# Patient Record
Sex: Male | Born: 1935 | Race: White | Hispanic: No | Smoking: Former smoker
Health system: Southern US, Community
[De-identification: ages and names within clinical notes are randomized; demographics above are authoritative.]

## PROBLEM LIST (undated history)

## (undated) DIAGNOSIS — E119 Type 2 diabetes mellitus without complications: Secondary | ICD-10-CM

## (undated) DIAGNOSIS — W19XXXA Unspecified fall, initial encounter: Secondary | ICD-10-CM

## (undated) DIAGNOSIS — R296 Repeated falls: Secondary | ICD-10-CM

## (undated) DIAGNOSIS — R42 Dizziness and giddiness: Secondary | ICD-10-CM

## (undated) HISTORY — DX: Type 2 diabetes mellitus without complications: E11.9

## (undated) HISTORY — PX: WISDOM TOOTH EXTRACTION: SHX21

## (undated) HISTORY — PX: TONSILLECTOMY: SUR1361

## (undated) HISTORY — DX: Unspecified fall, initial encounter: W19.XXXA

## (undated) HISTORY — PX: BLADDER SURGERY: SHX569

## (undated) HISTORY — DX: Dizziness and giddiness: R42

## (undated) HISTORY — DX: Repeated falls: R29.6

## (undated) HISTORY — PX: ADENOIDECTOMY: SUR15

## (undated) HISTORY — PX: CATARACT EXTRACTION: SUR2

---

## 1976-04-25 HISTORY — PX: VASECTOMY: SHX75

## 2007-04-26 HISTORY — PX: PROSTATE SURGERY: SHX751

## 2010-10-26 DIAGNOSIS — C61 Malignant neoplasm of prostate: Secondary | ICD-10-CM | POA: Insufficient documentation

## 2010-10-26 HISTORY — DX: Malignant neoplasm of prostate: C61

## 2011-02-02 DIAGNOSIS — J309 Allergic rhinitis, unspecified: Secondary | ICD-10-CM

## 2011-02-02 HISTORY — DX: Allergic rhinitis, unspecified: J30.9

## 2012-02-13 DIAGNOSIS — K635 Polyp of colon: Secondary | ICD-10-CM | POA: Insufficient documentation

## 2012-02-13 HISTORY — DX: Polyp of colon: K63.5

## 2012-04-25 HISTORY — PX: CATARACT EXTRACTION: SUR2

## 2012-12-13 DIAGNOSIS — F419 Anxiety disorder, unspecified: Secondary | ICD-10-CM | POA: Insufficient documentation

## 2012-12-13 HISTORY — DX: Anxiety disorder, unspecified: F41.9

## 2013-01-30 DIAGNOSIS — H2510 Age-related nuclear cataract, unspecified eye: Secondary | ICD-10-CM | POA: Insufficient documentation

## 2013-01-30 HISTORY — DX: Age-related nuclear cataract, unspecified eye: H25.10

## 2013-02-14 DIAGNOSIS — E1140 Type 2 diabetes mellitus with diabetic neuropathy, unspecified: Secondary | ICD-10-CM

## 2013-02-14 HISTORY — DX: Type 2 diabetes mellitus with diabetic neuropathy, unspecified: E11.40

## 2013-05-17 DIAGNOSIS — M1990 Unspecified osteoarthritis, unspecified site: Secondary | ICD-10-CM

## 2013-05-17 HISTORY — DX: Unspecified osteoarthritis, unspecified site: M19.90

## 2013-05-27 DIAGNOSIS — E559 Vitamin D deficiency, unspecified: Secondary | ICD-10-CM

## 2013-05-27 HISTORY — DX: Vitamin D deficiency, unspecified: E55.9

## 2014-06-20 DIAGNOSIS — G629 Polyneuropathy, unspecified: Secondary | ICD-10-CM

## 2014-06-20 HISTORY — DX: Polyneuropathy, unspecified: G62.9

## 2015-03-15 DIAGNOSIS — R269 Unspecified abnormalities of gait and mobility: Secondary | ICD-10-CM | POA: Insufficient documentation

## 2015-03-15 DIAGNOSIS — M51369 Other intervertebral disc degeneration, lumbar region without mention of lumbar back pain or lower extremity pain: Secondary | ICD-10-CM

## 2015-03-15 DIAGNOSIS — M5136 Other intervertebral disc degeneration, lumbar region: Secondary | ICD-10-CM

## 2015-03-15 HISTORY — DX: Unspecified abnormalities of gait and mobility: R26.9

## 2015-03-15 HISTORY — DX: Other intervertebral disc degeneration, lumbar region without mention of lumbar back pain or lower extremity pain: M51.369

## 2015-03-15 HISTORY — DX: Other intervertebral disc degeneration, lumbar region: M51.36

## 2016-04-04 DIAGNOSIS — N320 Bladder-neck obstruction: Secondary | ICD-10-CM | POA: Insufficient documentation

## 2016-04-04 HISTORY — DX: Bladder-neck obstruction: N32.0

## 2016-09-13 DIAGNOSIS — N529 Male erectile dysfunction, unspecified: Secondary | ICD-10-CM | POA: Insufficient documentation

## 2016-09-13 DIAGNOSIS — N400 Enlarged prostate without lower urinary tract symptoms: Secondary | ICD-10-CM | POA: Insufficient documentation

## 2016-09-13 HISTORY — DX: Male erectile dysfunction, unspecified: N52.9

## 2016-09-13 HISTORY — DX: Benign prostatic hyperplasia without lower urinary tract symptoms: N40.0

## 2016-12-15 DIAGNOSIS — R6889 Other general symptoms and signs: Secondary | ICD-10-CM

## 2016-12-15 HISTORY — DX: Other general symptoms and signs: R68.89

## 2017-03-31 DIAGNOSIS — E119 Type 2 diabetes mellitus without complications: Secondary | ICD-10-CM | POA: Insufficient documentation

## 2017-03-31 DIAGNOSIS — I10 Essential (primary) hypertension: Secondary | ICD-10-CM

## 2017-03-31 HISTORY — DX: Type 2 diabetes mellitus without complications: E11.9

## 2017-03-31 HISTORY — DX: Essential (primary) hypertension: I10

## 2017-12-29 ENCOUNTER — Encounter: Payer: Self-pay | Admitting: Podiatry

## 2017-12-29 ENCOUNTER — Ambulatory Visit (INDEPENDENT_AMBULATORY_CARE_PROVIDER_SITE_OTHER): Payer: Medicare Other | Admitting: Podiatry

## 2017-12-29 DIAGNOSIS — M79675 Pain in left toe(s): Secondary | ICD-10-CM | POA: Diagnosis not present

## 2017-12-29 DIAGNOSIS — M79674 Pain in right toe(s): Secondary | ICD-10-CM | POA: Diagnosis not present

## 2017-12-29 DIAGNOSIS — M2031 Hallux varus (acquired), right foot: Secondary | ICD-10-CM | POA: Diagnosis not present

## 2017-12-29 DIAGNOSIS — E1142 Type 2 diabetes mellitus with diabetic polyneuropathy: Secondary | ICD-10-CM | POA: Diagnosis not present

## 2017-12-29 DIAGNOSIS — B351 Tinea unguium: Secondary | ICD-10-CM

## 2017-12-29 NOTE — Patient Instructions (Signed)

## 2017-12-29 NOTE — Progress Notes (Signed)
This patient presents to the office with chief complaint of long thick nails and diabetic feet. He has been diabetic for 25 years. This patient  says there  is  no pain and discomfort in his  feet.  This patient says there are long thick painful nails. He says he has moved to [place] from [place] due to his wifes illness.  These nails are painful walking and wearing shoes.  Patient has no history of infection or drainage from both feet.  Patient is unable to  self treat his own nails . This patient presents  to the office today for treatment of the  long nails and a foot evaluation due to history of  diabetes.  General Appearance  Alert, conversant and in no acute stress.  Vascular  Dorsalis pedis and posterior tibial  pulses are palpable  bilaterally.  Capillary return is within normal limits  bilaterally. Temperature is within normal limits  bilaterally.  Neurologic  Senn-Weinstein monofilament wire test diminished   bilaterally. Muscle power within normal limits bilaterally.  Nails Thick disfigured discolored nails with subungual debris  from hallux to fifth toes bilaterally. No evidence of bacterial infection or drainage bilaterally.  Orthopedic  No limitations of motion of motion feet .  No crepitus or effusions noted.  Fixed contracted position of IPJ right hallux.  Skin  normotropic skin with no porokeratosis noted bilaterally.  No signs of infections or ulcers noted.     Onychomycosis  Diabetes with no foot complications  IE  Debride nails x 10.  A diabetic foot exam was performed and there is no evidence of any vascular  pathology.  Diminished LOPS noted.  RTC 3 months.   Gardiner Barefoot DPM

## 2018-03-30 ENCOUNTER — Encounter: Payer: Self-pay | Admitting: Podiatry

## 2018-03-30 ENCOUNTER — Ambulatory Visit (INDEPENDENT_AMBULATORY_CARE_PROVIDER_SITE_OTHER): Payer: Medicare Other | Admitting: Podiatry

## 2018-03-30 DIAGNOSIS — M79675 Pain in left toe(s): Secondary | ICD-10-CM | POA: Diagnosis not present

## 2018-03-30 DIAGNOSIS — B351 Tinea unguium: Secondary | ICD-10-CM

## 2018-03-30 DIAGNOSIS — M2031 Hallux varus (acquired), right foot: Secondary | ICD-10-CM

## 2018-03-30 DIAGNOSIS — E1142 Type 2 diabetes mellitus with diabetic polyneuropathy: Secondary | ICD-10-CM

## 2018-03-30 DIAGNOSIS — M79674 Pain in right toe(s): Secondary | ICD-10-CM

## 2018-03-30 NOTE — Progress Notes (Signed)
Complaint:  Visit Type: Patient returns to my office for continued preventative foot care services. Complaint: Patient states" my nails have grown long and thick and become painful to walk and wear shoes" Patient has been diagnosed with DM with no foot complications. The patient presents for preventative foot care services. No changes to ROS.  Patient is also concerned about his painful big toe right foot.  Podiatric Exam: Vascular: dorsalis pedis and posterior tibial pulses are palpable bilateral. Capillary return is immediate. Temperature gradient is WNL. Skin turgor WNL  Sensorium: Normal Semmes Weinstein monofilament test. Normal tactile sensation bilaterally. Nail Exam: Pt has thick disfigured discolored nails with subungual debris noted bilateral entire nail hallux through fifth toenails Ulcer Exam: There is no evidence of ulcer or pre-ulcerative changes or infection. Orthopedic Exam: Muscle tone and strength are WNL. No limitations in general ROM. No crepitus or effusions noted. Foot type and digits show no abnormalities. Fixed hallux malleus IPJ right foot. Skin: No Porokeratosis. No infection or ulcers  Diagnosis:  Onychomycosis, , Pain in right toe, pain in left toes  Treatment & Plan Procedures and Treatment: Consent by patient was obtained for treatment procedures.   Debridement of mycotic and hypertrophic toenails, 1 through 5 bilateral and clearing of subungual debris. No ulceration, no infection noted. Patient to be referred to Dr.  March Rummage. Return Visit-Office Procedure: Patient instructed to return to the office for a follow up visit 3 months for continued evaluation and treatment.    Gardiner Barefoot DPM

## 2018-04-19 ENCOUNTER — Ambulatory Visit (INDEPENDENT_AMBULATORY_CARE_PROVIDER_SITE_OTHER): Payer: Medicare Other

## 2018-04-19 ENCOUNTER — Ambulatory Visit (INDEPENDENT_AMBULATORY_CARE_PROVIDER_SITE_OTHER): Payer: Medicare Other | Admitting: Podiatry

## 2018-04-19 DIAGNOSIS — M2031 Hallux varus (acquired), right foot: Secondary | ICD-10-CM

## 2018-04-19 DIAGNOSIS — M2041 Other hammer toe(s) (acquired), right foot: Secondary | ICD-10-CM

## 2018-04-19 DIAGNOSIS — M79674 Pain in right toe(s): Secondary | ICD-10-CM

## 2018-04-19 DIAGNOSIS — M19079 Primary osteoarthritis, unspecified ankle and foot: Secondary | ICD-10-CM

## 2018-04-19 NOTE — Progress Notes (Signed)
Subjective:  Patient ID: Logan Hobbs, male    DOB: 1934/07/02,  MRN: [id number]  Chief Complaint  Patient presents with  . Toe Pain    right great toe pain Prudence Davidson referral    83 y.o. male presents with the above complaint.  Reports pain to the top of the right great toe has had this issue for several months.  Seen Dr. Sharyon Cable for this and for for surgical evaluation.  States that the pain is most on the top of the toe hurts with shoe gear.  Review of Systems: Negative except as noted in the HPI. Denies N/V/F/Ch.  Past Medical History:  Diagnosis Date  . Diabetes mellitus without complication (HCC)     Current Outpatient Medications:  .  colchicine 0.6 MG tablet, TAKE 2 TABLETS BY MOUTH NOW AND 1 TABLET IN 1 HOUR, Disp: , Rfl: 0 .  indomethacin (INDOCIN SR) 75 MG CR capsule, Take 75 mg by mouth 2 (two) times daily., Disp: , Rfl: 0 .  L-Methylfolate-Algae-B12-B6 (METANX) 3-90.314-2-35 MG CAPS, TK 1 C PO twice daily, Disp: , Rfl:  .  LACTOBACILLUS PO, Take by mouth., Disp: , Rfl:  .  Lancet Devices (BAYER MICROLET 2 LANCING DEVIC) MISC, Check blood sugar once daily, Disp: , Rfl:  .  losartan (COZAAR) 25 MG tablet, TAKE 1 TABLET DAILY, Disp: , Rfl:  .  ONETOUCH DELICA LANCETS 38S MISC, CHECK BLOOD SUGAR ONCE DAILY, Disp: , Rfl:  .  rosuvastatin (CRESTOR) 5 MG tablet, TAKE 1 TABLET AT BEDTIME, Disp: , Rfl:  .  SILDENAFIL CITRATE PO, Take by mouth., Disp: , Rfl:  .  sitaGLIPtin-metformin (JANUMET) 50-1000 MG tablet, TAKE 1 TABLET TWICE DAILY  WITH MEALS, Disp: , Rfl:   Social History   Tobacco Use  Smoking Status Never Smoker  Smokeless Tobacco Never Used    Allergies  Allergen Reactions  . Latex Shortness Of Breath    Skin itching and breathing problems  . Ciprofloxacin     constipation  . Seasonal Ic [Cholestatin]   . Solifenacin Succinate     constipation  . Tamsulosin Hcl     Weakness, vertigo, and nausea   Objective:  There were no vitals filed for this  visit. There is no height or weight on file to calculate BMI. Constitutional Well developed. Well nourished.  Vascular Dorsalis pedis pulses palpable bilaterally. Posterior tibial pulses palpable bilaterally. Capillary refill normal to all digits.  No cyanosis or clubbing noted. Pedal hair growth normal.  Neurologic Normal speech. Oriented to person, place, and time. Epicritic sensation to light touch grossly present bilaterally.  Dermatologic Nails well groomed and normal in appearance. No open wounds. No skin lesions.  Orthopedic: Normal joint ROM without pain or crepitus bilaterally. Hallux IPJ malleus deformity with pain to palpation with dorsal aspect   Radiographs: Hallux hammertoe noted with IPJ arthritis Assessment:   1. Hallux malleus of right foot   2. Pain in toe of right foot   3. Arthritis of big toe   4. Acquired hammertoe of right foot    Plan:  Patient was evaluated and treated and all questions answered.  Hallux malleus right great toe -X-rays taken and reviewed -Discussed conservative versus surgical treatment for this issue.  To surgical options discussed the patient consisting of either hallux IPJ arthroplasty or arthrodesis.  As patient is of advanced age I think he is would benefit more from arthroplasty.  Discussed risks and benefits including flail toe, cock-up toe, overcorrection, under correction,  need for future surgery.  Patient would like to proceed.  Discussed adjacent tissue transfer to alleviate soft tissue component.  Surgical discussion discussed with patient at length -Patient has failed all conservative therapy and wishes to proceed with surgical intervention. All risks, benefits, and alternatives discussed with patient. No guarantees given. Consent reviewed and signed by patient. -Planned procedures: Right hallux IPJ arthroplasty, adjacent tissue transfer  25 minutes of face to face time were spent with the patient. >50% of this was spent on  counseling and coordination of care. Specifically discussed with patient the above diagnoses and overall treatment plan.   Return for post op.

## 2018-04-19 NOTE — Patient Instructions (Signed)
Pre-Operative Instructions  Congratulations, you have decided to take an important step towards improving your quality of life.  You can be assured that the doctors and staff at [facility] will be with you every step of the way.  Here are some important things you should know:  1. Plan to be at the surgery center/hospital at least 1 (one) hour prior to your scheduled time, unless otherwise directed by the surgical center/hospital staff.  You must have a responsible adult accompany you, remain during the surgery and drive you home.  Make sure you have directions to the surgical center/hospital to ensure you arrive on time. 2. If you are having surgery at [facility] or [facility], you will need a copy of your medical history and physical form from your family physician within one month prior to the date of surgery. We will give you a form for your primary physician to complete.  3. We make every effort to accommodate the date you request for surgery.  However, there are times where surgery dates or times have to be moved.  We will contact you as soon as possible if a change in schedule is required.   4. No aspirin/ibuprofen for one week before surgery.  If you are on aspirin, any non-steroidal anti-inflammatory medications (Mobic, Aleve, Ibuprofen) should not be taken seven (7) days prior to your surgery.  You make take Tylenol for pain prior to surgery.  5. Medications - If you are taking daily heart and blood pressure medications, seizure, reflux, allergy, asthma, anxiety, pain or diabetes medications, make sure you notify the surgery center/hospital before the day of surgery so they can tell you which medications you should take or avoid the day of surgery. 6. No food or drink after midnight the night before surgery unless directed otherwise by surgical center/hospital staff. 7. No alcoholic beverages 24-hours prior to surgery.  No smoking 24-hours prior or 24-hours after  surgery. 8. Wear loose pants or shorts. They should be loose enough to fit over bandages, boots, and casts. 9. Don't wear slip-on shoes. Sneakers are preferred. 10. Bring your boot with you to the surgery center/hospital.  Also bring crutches or a walker if your physician has prescribed it for you.  If you do not have this equipment, it will be provided for you after surgery. 11. If you have not been contacted by the surgery center/hospital by the day before your surgery, call to confirm the date and time of your surgery. 12. Leave-time from work may vary depending on the type of surgery you have.  Appropriate arrangements should be made prior to surgery with your employer. 13. Prescriptions will be provided immediately following surgery by your doctor.  Fill these as soon as possible after surgery and take the medication as directed. Pain medications will not be refilled on weekends and must be approved by the doctor. 14. Remove nail polish on the operative foot and avoid getting pedicures prior to surgery. 15. Wash the night before surgery.  The night before surgery wash the foot and leg well with water and the antibacterial soap provided. Be sure to pay special attention to beneath the toenails and in between the toes.  Wash for at least three (3) minutes. Rinse thoroughly with water and dry well with a towel.  Perform this wash unless told not to do so by your physician.  Enclosed: 1 Ice pack (please put in freezer the night before surgery)   1 Hibiclens skin cleaner     Pre-op instructions  If you have any questions regarding the instructions, please do not hesitate to call our office.  [place]: [address], [place], [place] [address] -- [phone]  [place]: [address] [place], [place] [address] -- [phone]  [place]: [address]  [place], [place] [address] -- [phone]  [place]: [address], [address], [place], [place] [address] -- [phone]  Website:  [email or web address] 

## 2018-05-08 ENCOUNTER — Encounter: Payer: Self-pay | Admitting: *Deleted

## 2018-05-08 NOTE — Progress Notes (Signed)
Per Dr. March Rummage, I sent a medical clearance request letter to Dr. Geraldo Pitter and Marda Stalker, PA.

## 2018-05-16 ENCOUNTER — Ambulatory Visit (INDEPENDENT_AMBULATORY_CARE_PROVIDER_SITE_OTHER): Payer: Medicare Other | Admitting: Cardiology

## 2018-05-16 ENCOUNTER — Ambulatory Visit: Payer: Self-pay | Admitting: Cardiology

## 2018-05-16 ENCOUNTER — Encounter: Payer: Self-pay | Admitting: Cardiology

## 2018-05-16 VITALS — BP 134/76 | HR 66 | Ht 71.0 in | Wt 199.0 lb

## 2018-05-16 DIAGNOSIS — H9190 Unspecified hearing loss, unspecified ear: Secondary | ICD-10-CM

## 2018-05-16 DIAGNOSIS — Z01810 Encounter for preprocedural cardiovascular examination: Secondary | ICD-10-CM

## 2018-05-16 DIAGNOSIS — E119 Type 2 diabetes mellitus without complications: Secondary | ICD-10-CM | POA: Diagnosis not present

## 2018-05-16 DIAGNOSIS — I10 Essential (primary) hypertension: Secondary | ICD-10-CM | POA: Insufficient documentation

## 2018-05-16 DIAGNOSIS — R42 Dizziness and giddiness: Secondary | ICD-10-CM

## 2018-05-16 DIAGNOSIS — B356 Tinea cruris: Secondary | ICD-10-CM

## 2018-05-16 DIAGNOSIS — C801 Malignant (primary) neoplasm, unspecified: Secondary | ICD-10-CM | POA: Insufficient documentation

## 2018-05-16 DIAGNOSIS — E785 Hyperlipidemia, unspecified: Secondary | ICD-10-CM | POA: Insufficient documentation

## 2018-05-16 DIAGNOSIS — E782 Mixed hyperlipidemia: Secondary | ICD-10-CM

## 2018-05-16 DIAGNOSIS — B351 Tinea unguium: Secondary | ICD-10-CM

## 2018-05-16 HISTORY — DX: Encounter for preprocedural cardiovascular examination: Z01.810

## 2018-05-16 HISTORY — DX: Unspecified hearing loss, unspecified ear: H91.90

## 2018-05-16 HISTORY — DX: Tinea cruris: B35.6

## 2018-05-16 HISTORY — DX: Dizziness and giddiness: R42

## 2018-05-16 HISTORY — DX: Hyperlipidemia, unspecified: E78.5

## 2018-05-16 HISTORY — DX: Malignant (primary) neoplasm, unspecified: C80.1

## 2018-05-16 HISTORY — DX: Tinea unguium: B35.1

## 2018-05-16 HISTORY — DX: Essential (primary) hypertension: I10

## 2018-05-16 NOTE — Progress Notes (Addendum)
Cardiology Office Note:    Date:  05/16/2018   ID:  Logan Hobbs, DOB [birth year], MRN [id number]  PCP:  Logan Stalker, PA-C  Cardiologist:  Logan Lindau, MD   Referring MD: Logan Stalker, PA-C    ASSESSMENT:    1. Pre-operative cardiovascular examination   2. Diabetes mellitus with coincident hypertension ([place])   3. Mixed hyperlipidemia   4. Essential hypertension    PLAN:    In order of problems listed above:  1. Primary prevention stressed with the patient.  Importance of compliance with diet and medication stressed and he vocalized understanding.  His blood pressure is stable.  Diet was discussed for dyslipidemia and diabetes mellitus and he is doing well with it. 2. In view of multiple risk factors for coronary artery disease he will undergo stress echo.  If this is negative then he is not at high risk for coronary events during the aforementioned surgery.  Meticulous hemodynamic monitoring will further reduce the risk of coronary events. 3. Patient will be seen in follow-up appointment on a as needed basis only.   Addendum on 05/21/2018 Patient had a stress test.  His effort tolerance was fair.  His stress test was unremarkable and in view of this he is not at high risk for coronary events during the aforementioned surgery.  Meticulous hemodynamic monitoring as mentioned above will further reduce the risk of coronary events.  Medication Adjustments/Labs and Tests Ordered: Current medicines are reviewed at length with the patient today.  Concerns regarding medicines are outlined above.  No orders of the defined types were placed in this encounter.  No orders of the defined types were placed in this encounter.    History of Present Illness:    Logan Hobbs is a 84 y.o. male who is being seen today for the evaluation of preop cardiovascular evaluation at the request of Logan Hobbs, [place].  Patient is a pleasant 84 year old male.  He has past medical  history of essential hypertension, dyslipidemia and diabetes mellitus.  Patient mentions to me that he is planning to undergo surgery on his right toe for which she is sent here for evaluation.  He is a very active gentleman.  He has slowed on his walking because of his lower extremity issues.  He is accompanied by his wife for this visit.  He denies any chest pain orthopnea or PND.  At the time of my evaluation, the patient is alert awake oriented and in no distress.  Past Medical History:  Diagnosis Date  . Diabetes mellitus without complication [facility])     Past Surgical History:  Procedure Laterality Date  . CATARACT EXTRACTION    . PROSTATE SURGERY  2009  . TONSILLECTOMY    . WISDOM TOOTH EXTRACTION      Current Medications: Current Meds  Medication Sig  . cycloSPORINE (RESTASIS) 0.05 % ophthalmic emulsion Place 1 drop into both eyes 2 (two) times daily.  Marland Kitchen L-Methylfolate-Algae-B12-B6 (METANX) 3-90.314-2-35 MG CAPS TK 1 C PO twice daily  . LACTOBACILLUS PO Take 1 capsule by mouth.   Elmore Guise Devices (BAYER MICROLET 2 LANCING DEVIC) MISC Check blood sugar once daily  . losartan (COZAAR) 25 MG tablet Take 25 mg by mouth daily.   Glory Rosebush DELICA LANCETS 36R MISC CHECK BLOOD SUGAR ONCE DAILY  . rosuvastatin (CRESTOR) 5 MG tablet Take 5 mg by mouth daily at 6 PM.   . SILDENAFIL CITRATE PO Take by mouth.  . sitaGLIPtin-metformin (JANUMET) 50-1000 MG tablet  TAKE 1 TABLET TWICE DAILY  WITH MEALS     Allergies:   Latex; Ciprofloxacin; Seasonal ic [cholestatin]; Solifenacin succinate; and Tamsulosin hcl   Social History   Socioeconomic History  . Marital status: Married    Spouse name: Not on file  . Number of children: Not on file  . Years of education: Not on file  . Highest education level: Not on file  Occupational History  . Not on file  Social Needs  . Financial resource strain: Not on file  . Food insecurity:    Worry: Not on file    Inability: Not on file  .  Transportation needs:    Medical: Not on file    Non-medical: Not on file  Tobacco Use  . Smoking status: Never Smoker  . Smokeless tobacco: Never Used  Substance and Sexual Activity  . Alcohol use: Yes    Comment: beer occs.  . Drug use: Not on file  . Sexual activity: Not on file  Lifestyle  . Physical activity:    Days per week: Not on file    Minutes per session: Not on file  . Stress: Not on file  Relationships  . Social connections:    Talks on phone: Not on file    Gets together: Not on file    Attends religious service: Not on file    Active member of club or organization: Not on file    Attends meetings of clubs or organizations: Not on file    Relationship status: Not on file  Other Topics Concern  . Not on file  Social History Narrative  . Not on file     Family History: The patient's family history is not on file.  ROS:   Please see the history of present illness.    All other systems reviewed and are negative.  EKGs/Labs/Other Studies Reviewed:    The following studies were reviewed today: EKG reveals sinus rhythm and nonspecific ST-T changes.   Recent Labs: No results found for requested labs within last 8760 hours.  Recent Lipid Panel No results found for: CHOL, TRIG, HDL, CHOLHDL, VLDL, LDLCALC, LDLDIRECT  Physical Exam:    VS:  BP 134/76 (BP Location: Right Arm, Patient Position: Sitting, Cuff Size: Normal)   Pulse 66   Ht 5\' 11"  (1.803 m)   Wt 199 lb (90.3 kg)   SpO2 98%   BMI 27.75 kg/m     Wt Readings from Last 3 Encounters:  05/16/18 199 lb (90.3 kg)     GEN: Patient is in no acute distress HEENT: Normal NECK: No JVD; No carotid bruits LYMPHATICS: No lymphadenopathy CARDIAC: S1 S2 regular, 2/6 systolic murmur at the apex. RESPIRATORY:  Clear to auscultation without rales, wheezing or rhonchi  ABDOMEN: Soft, non-tender, non-distended MUSCULOSKELETAL:  No edema; No deformity  SKIN: Warm and dry NEUROLOGIC:  Alert and oriented x  3 PSYCHIATRIC:  Normal affect    Signed, Logan Lindau, MD  05/16/2018 10:32 AM    [place]

## 2018-05-16 NOTE — Patient Instructions (Signed)
Medication Instructions:  Your physician recommends that you continue on your current medications as directed. Please refer to the Current Medication list given to you today.  If you need a refill on your cardiac medications before your next appointment, please call your pharmacy.   Lab work: None.  If you have labs (blood work) drawn today and your tests are completely normal, you will receive your results only by: Marland Kitchen MyChart Message (if you have MyChart) OR . A paper copy in the mail If you have any lab test that is abnormal or we need to change your treatment, we will call you to review the results.  Testing/Procedures: Your physician has requested that you have a stress echocardiogram. For further information please visit [email or web address]. Please follow instruction sheet as given.    Follow-Up: Follow up as needed.   Any Other Special Instructions Will Be Listed Below (If Applicable).   Exercise Stress Echocardiogram  An exercise stress echocardiogram is a test to check how well your heart is working. This test uses sound waves (ultrasound) and a computer to make images of your heart before and after exercise. Ultrasound images that are taken before you exercise (your resting echocardiogram) will show how much blood is getting to your heart muscle and how well your heart muscle and heart valves are functioning. During the next part of this test, you will walk on a treadmill or ride a stationary bike to see how exercise affects your heart. While you exercise, the electrical activity of your heart will be monitored with an electrocardiogram (ECG). Your blood pressure will also be monitored. You may have this test if you:  Have chest pain or other symptoms of a heart problem.  Recently had a heart attack or heart surgery.  Have heart valve problems.  Have a condition that causes narrowing of the blood vessels that supply your heart (coronary artery disease).  Have a high  risk of heart disease and are starting a new exercise program.  Have a high risk of heart disease and need to have major surgery. Tell a health care provider about:  Any allergies you have.  All medicines you are taking, including vitamins, herbs, eye drops, creams, and over-the-counter medicines.  Any problems you or family members have had with anesthetic medicines.  Any blood disorders you have.  Any surgeries you have had.  Any medical conditions you have.  Whether you are pregnant or may be pregnant. What are the risks? Generally, this is a safe procedure. However, problems may occur, including:  Chest pain.  Dizziness or light-headedness.  Shortness of breath.  Increased or irregular heartbeat (palpitations).  Nausea or vomiting.  Heart attack (very rare). What happens before the procedure?  Follow instructions from your health care provider about eating or drinking restrictions. You may be asked to avoid all forms of caffeine for 24 hours before your procedure, or as told by your health care provider.  Ask your health care provider about changing or stopping your regular medicines. This is especially important if you are taking diabetes medicines or blood thinners.  If you use an inhaler, bring it with you to the test.  Wear loose, comfortable clothing and walking shoes.  Do notuse any products that contain nicotine or tobacco, such as cigarettes and e-cigarettes, for 4 hours before the test or as told by your health care provider. If you need help quitting, ask your health care provider. What happens during the procedure?  You will take off your  clothes from the waist up and put on a hospital gown.  A technician will place electrodes on your chest.  A blood pressure cuff will be placed on your arm.  You will lie down on a table for an ultrasound exam before you exercise. Gel will be rubbed on your chest, and a handheld device (transducer) will be pressed  against your chest and moved over your heart.  Then, you will start exercising by walking on a treadmill or pedaling a stationary bicycle.  Your blood pressure and heart rhythm will be monitored while you exercise.  The exercise will gradually get harder or faster.  You will exercise until: ? Your heart reaches a target level. ? You are too tired to continue. ? You cannot continue because of chest pain, weakness, or dizziness.  You will have another ultrasound exam after you stop exercising. The procedure may vary among health care providers and hospitals. What happens after the procedure?  Your heart rate and blood pressure will be monitored until they return to your normal levels. Summary  An exercise stress echocardiogram is a test that uses ultrasound to check how well your heart works before and after exercise.  Before the test, follow instructions from your health care provider about stopping medications, avoiding nicotine and tobacco, and avoiding certain foods and drinks.  During the test, your blood pressure and heart rhythm will be monitored while you exercise on a treadmill or stationary bicycle. This information is not intended to replace advice given to you by your health care provider. Make sure you discuss any questions you have with your health care provider. Document Released: 04/15/2004 Document Revised: 12/02/2015 Document Reviewed: 12/02/2015 Elsevier Interactive Patient Education  2019 [employer].

## 2018-05-18 ENCOUNTER — Telehealth: Payer: Self-pay | Admitting: *Deleted

## 2018-05-18 NOTE — Telephone Encounter (Signed)
"  I'm scheduled to have surgery next week.  I'm calling to follow-up and make sure you have everything that is needed."  We have not received medical clearance to perform the surgery from Marda Stalker, NP and Dr. Geraldo Pitter yet.  We'll need that prior to you having surgery.  "I had blood work and all that by Marda Stalker and I have seen Dr. Geraldo Pitter.  I'm scheduled to have an ECHO on Monday.  Maybe they're waiting on those results before they give an answer."  I'm not sure but you may want to call them and ask.  "I'll give them a call now.  Who do they send the information to, you?"  Yes, I sent them both a letter requesting the clearance.  The letter has my fax number on it.  "Okay, I'll give them a call."

## 2018-05-18 NOTE — Telephone Encounter (Signed)
"  I'm calling about my father, Logan Hobbs.  He's supposed to have surgery on Wednesday with Dr. March Rummage.  He said he talked to you earlier and you had not received anything from his doctors.  I'm assuming you are following up on this.  Please give me a call at your earliest convenience.  I'll see if I can help you get the information you need."  "I'm calling again.  I spoke with his primary physician at [facility] and they said they faxed you that information today before lunch.  Contact me if you did not receive this information.  I'll get them to fax it again.

## 2018-05-21 ENCOUNTER — Ambulatory Visit (HOSPITAL_BASED_OUTPATIENT_CLINIC_OR_DEPARTMENT_OTHER)
Admission: RE | Admit: 2018-05-21 | Discharge: 2018-05-21 | Disposition: A | Discharge status: Home / Self Care | Payer: Medicare Other | Source: Ambulatory Visit | Attending: Cardiology | Admitting: Cardiology

## 2018-05-21 DIAGNOSIS — I10 Essential (primary) hypertension: Secondary | ICD-10-CM | POA: Diagnosis not present

## 2018-05-21 DIAGNOSIS — Z01810 Encounter for preprocedural cardiovascular examination: Secondary | ICD-10-CM

## 2018-05-21 NOTE — Progress Notes (Signed)
  Echocardiogram Echocardiogram Stress Test has been performed.  Jatavis Malek T Dorina Ribaudo 05/21/2018, 3:55 PM

## 2018-05-22 NOTE — Telephone Encounter (Signed)
"  I'm calling from Dr. Julien Nordmann office.  You all sent a letter requesting clearance for Mr. Logan Hobbs.  Dr. Geraldo Pitter has approved him to have surgery.  The information is in Epic."  Thank you so much.  I left messages for Mr. Logan Hobbs and his daughter, Logan Hobbs, that we received medical clearance for him to have the surgery from Dr. Geraldo Pitter.  I asked them to call if they have any further questions.

## 2018-05-23 ENCOUNTER — Other Ambulatory Visit: Payer: Self-pay | Admitting: Podiatry

## 2018-05-23 DIAGNOSIS — M2041 Other hammer toe(s) (acquired), right foot: Secondary | ICD-10-CM | POA: Diagnosis not present

## 2018-05-23 MED ORDER — HYDROCODONE-ACETAMINOPHEN 5-325 MG PO TABS: 1.0000 | ORAL_TABLET | Freq: Four times a day (QID) | ORAL | 0 refills | Status: DC | PRN

## 2018-05-23 MED ORDER — ONDANSETRON HCL 4 MG PO TABS: 4.0000 mg | ORAL_TABLET | Freq: Three times a day (TID) | ORAL | 0 refills | Status: DC | PRN

## 2018-05-23 MED ORDER — CEPHALEXIN 500 MG PO CAPS: 500.0000 mg | ORAL_CAPSULE | Freq: Two times a day (BID) | ORAL | 0 refills | Status: DC

## 2018-05-23 NOTE — Progress Notes (Signed)
Rx sent to pharmacy for outpatient surgery. °

## 2018-05-24 ENCOUNTER — Telehealth: Payer: Self-pay | Admitting: *Deleted

## 2018-05-24 NOTE — Telephone Encounter (Signed)
I called pt's dtr, Kyra Manges and explained all of the instructions given to pt's assistant, Melissa. Kyra Manges thanked me.

## 2018-05-24 NOTE — Telephone Encounter (Signed)
Pt's dtr, Kyra Manges states he is having a lot of pain and swelling and the aide is having to hold the ice pack off of the foot. Kyra Manges states pt does not have a high pain threshold. I asked for the phone number to call pt and she states there are 2 aides there one helping pt is Melissa a private CNA and can reach pt at [phone].

## 2018-05-24 NOTE — Telephone Encounter (Signed)
I spoke with Melissa and asked her to have pt to describe the pain. Pt states it is throbbing waves and has stopped at this time. Melissa state pt had foot propped to eat and then had down to get back to resting. I told Melissa it sounded like the ace wrap was too tight at this point and to remove the boot, sock and the ace wrap, then elevate the foot for 15 minutes, but if the pain increased while elevated needs to dangle for 15 minutes, after 15 minutes either way then put foot level with the hip and beginning at the toes loosely rewrap the ace single layer up the leg, may rest without the boot, but must be in the boot to walk, sleep and should not be up on the foot more than 15 minutes per hour. Melissa instructed pt as I informed her.

## 2018-05-24 NOTE — Telephone Encounter (Signed)
Noted thanks °

## 2018-05-25 ENCOUNTER — Ambulatory Visit (INDEPENDENT_AMBULATORY_CARE_PROVIDER_SITE_OTHER): Payer: Self-pay | Admitting: Podiatry

## 2018-05-25 VITALS — Temp 97.7°F

## 2018-05-25 DIAGNOSIS — Z09 Encounter for follow-up examination after completed treatment for conditions other than malignant neoplasm: Secondary | ICD-10-CM

## 2018-05-25 DIAGNOSIS — M2031 Hallux varus (acquired), right foot: Secondary | ICD-10-CM

## 2018-05-26 NOTE — Progress Notes (Signed)
Subjective:  Patient ID: Logan Hobbs, male    DOB: May 30, 1934,  MRN: [id number]  Chief Complaint  Patient presents with  . Routine Post Op     01.29.2020 Hammertoe Repair Hallux Rt " my foot was really throbbing for the first few days then the pain has improved"     DOS: 05/23/2018 Procedure: Rt hallux hammertoe repair  84 y.o. male returns for post-op check.  Had a lot of pain after the first day and was throbbing. Loosened the ACE bandage and it improved.  Review of Systems: Negative except as noted in the HPI. Denies N/V/F/Ch.  Past Medical History:  Diagnosis Date  . Diabetes mellitus without complication ([place])     Current Outpatient Medications:  .  cephALEXin (KEFLEX) 500 MG capsule, Take 1 capsule (500 mg total) by mouth 2 (two) times daily., Disp: 14 capsule, Rfl: 0 .  cycloSPORINE (RESTASIS) 0.05 % ophthalmic emulsion, Place 1 drop into both eyes 2 (two) times daily., Disp: , Rfl:  .  HYDROcodone-acetaminophen (NORCO) 5-325 MG tablet, Take 1 tablet by mouth every 6 (six) hours as needed for moderate pain., Disp: 30 tablet, Rfl: 0 .  L-Methylfolate-Algae-B12-B6 (METANX) 3-90.314-2-35 MG CAPS, TK 1 C PO twice daily, Disp: , Rfl:  .  LACTOBACILLUS PO, Take 1 capsule by mouth. , Disp: , Rfl:  .  Lancet Devices (BAYER MICROLET 2 LANCING DEVIC) MISC, Check blood sugar once daily, Disp: , Rfl:  .  losartan (COZAAR) 25 MG tablet, Take 25 mg by mouth daily. , Disp: , Rfl:  .  ondansetron (ZOFRAN) 4 MG tablet, Take 1 tablet (4 mg total) by mouth every 8 (eight) hours as needed for nausea or vomiting., Disp: 20 tablet, Rfl: 0 .  ONETOUCH DELICA LANCETS 40J MISC, CHECK BLOOD SUGAR ONCE DAILY, Disp: , Rfl:  .  rosuvastatin (CRESTOR) 5 MG tablet, Take 5 mg by mouth daily at 6 PM. , Disp: , Rfl:  .  SILDENAFIL CITRATE PO, Take by mouth., Disp: , Rfl:  .  sitaGLIPtin-metformin (JANUMET) 50-1000 MG tablet, TAKE 1 TABLET TWICE DAILY  WITH MEALS, Disp: , Rfl:   Social History    Tobacco Use  Smoking Status Never Smoker  Smokeless Tobacco Never Used    Allergies  Allergen Reactions  . Latex Shortness Of Breath    Skin itching and breathing problems  . Ciprofloxacin     constipation  . Seasonal Ic [Cholestatin]   . Solifenacin Succinate     constipation  . Tamsulosin Hcl     Weakness, vertigo, and nausea   Objective:   Vitals:   05/25/18 1207  Temp: 97.7 F (36.5 C)   There is no height or weight on file to calculate BMI. Constitutional Well developed. Well nourished.  Vascular Foot warm and well perfused. Capillary refill normal to all digits.   Neurologic Normal speech. Oriented to person, place, and time. Epicritic sensation to light touch grossly present bilaterally.  Dermatologic Skin healing well without signs of infection. Skin edges well coapted without signs of infection.  Redness consistent with contusion rather than erythema noted about the surgical site  Orthopedic: Tenderness to palpation noted about the surgical site.   Radiographs: None  Assessment:   1. Hallux malleus of right foot   2. Surgery follow-up    Plan:  Patient was evaluated and treated and all questions answered.  S/p foot surgery right -Progressing as expected post-operatively. -XR: None -WB Status: WBAT in surgical shoe -Sutures: Intact. -Medications: none refilled. -  Foot redressed.  Return in about 4 days (around 05/29/2018).

## 2018-05-29 ENCOUNTER — Ambulatory Visit: Payer: Medicare Other

## 2018-05-29 DIAGNOSIS — Z09 Encounter for follow-up examination after completed treatment for conditions other than malignant neoplasm: Secondary | ICD-10-CM

## 2018-05-29 DIAGNOSIS — M2031 Hallux varus (acquired), right foot: Secondary | ICD-10-CM

## 2018-05-29 NOTE — Progress Notes (Signed)
Patient is here for postop dressing change, recent procedure performed on 05/23/2018, hammertoe repair hallux right.  He states that the toe is feeling much better than it was a few days ago, and he seems to be walking better.  Noted reduction in redness around the big toe, no warmth, no drainage.  All sutures remain intact.  No other signs and symptoms of infection.  Betadine and dry sterile dressing were applied along with compression, he is to follow-up this week to see Dr. March Rummage or sooner with any acute symptom changes.

## 2018-05-31 ENCOUNTER — Ambulatory Visit (INDEPENDENT_AMBULATORY_CARE_PROVIDER_SITE_OTHER): Payer: Self-pay | Admitting: Podiatry

## 2018-05-31 DIAGNOSIS — M2031 Hallux varus (acquired), right foot: Secondary | ICD-10-CM

## 2018-05-31 DIAGNOSIS — Z09 Encounter for follow-up examination after completed treatment for conditions other than malignant neoplasm: Secondary | ICD-10-CM

## 2018-05-31 NOTE — Progress Notes (Signed)
Subjective:  Patient ID: Logan Hobbs, male    DOB: October 16, 1934,  MRN: [id number]  Chief Complaint  Patient presents with  . Routine Post Op     DOS 05/23/2018 Hammertoe Repair Hallux Rt; "doing really good; not much pain but would like to switch to tylenol for pain meds when needed"    DOS: 05/23/2018 Procedure: Rt hallux hammertoe repair  84 y.o. male returns for post-op check.  Doing much better with pain.  Review of Systems: Negative except as noted in the HPI. Denies N/V/F/Ch.  Past Medical History:  Diagnosis Date  . Diabetes mellitus without complication ([facility])     Current Outpatient Medications:  .  cycloSPORINE (RESTASIS) 0.05 % ophthalmic emulsion, Place 1 drop into both eyes 2 (two) times daily., Disp: , Rfl:  .  L-Methylfolate-Algae-B12-B6 (METANX) 3-90.314-2-35 MG CAPS, TK 1 C PO twice daily, Disp: , Rfl:  .  LACTOBACILLUS PO, Take 1 capsule by mouth. , Disp: , Rfl:  .  Lancet Devices (BAYER MICROLET 2 LANCING DEVIC) MISC, Check blood sugar once daily, Disp: , Rfl:  .  losartan (COZAAR) 25 MG tablet, Take 25 mg by mouth daily. , Disp: , Rfl:  .  ondansetron (ZOFRAN) 4 MG tablet, Take 1 tablet (4 mg total) by mouth every 8 (eight) hours as needed for nausea or vomiting., Disp: 20 tablet, Rfl: 0 .  ONETOUCH DELICA LANCETS 28Z MISC, CHECK BLOOD SUGAR ONCE DAILY, Disp: , Rfl:  .  rosuvastatin (CRESTOR) 5 MG tablet, Take 5 mg by mouth daily at 6 PM. , Disp: , Rfl:  .  SILDENAFIL CITRATE PO, Take by mouth., Disp: , Rfl:  .  sitaGLIPtin-metformin (JANUMET) 50-1000 MG tablet, TAKE 1 TABLET TWICE DAILY  WITH MEALS, Disp: , Rfl:  .  cephALEXin (KEFLEX) 500 MG capsule, Take 1 capsule (500 mg total) by mouth 2 (two) times daily. (Patient not taking: Reported on 05/31/2018), Disp: 14 capsule, Rfl: 0 .  HYDROcodone-acetaminophen (NORCO) 5-325 MG tablet, Take 1 tablet by mouth every 6 (six) hours as needed for moderate pain. (Patient not taking: Reported on 05/31/2018), Disp: 30 tablet,  Rfl: 0  Social History   Tobacco Use  Smoking Status Never Smoker  Smokeless Tobacco Never Used    Allergies  Allergen Reactions  . Latex Shortness Of Breath    Skin itching and breathing problems  . Ciprofloxacin     constipation  . Seasonal Ic [Cholestatin]   . Solifenacin Succinate     constipation  . Tamsulosin Hcl     Weakness, vertigo, and nausea   Objective:   There were no vitals filed for this visit. There is no height or weight on file to calculate BMI. Constitutional Well developed. Well nourished.  Vascular Foot warm and well perfused. Capillary refill normal to all digits.   Neurologic Normal speech. Oriented to person, place, and time. Epicritic sensation to light touch grossly present bilaterally.  Dermatologic Skin healing well without signs of infection. Skin edges well coapted without signs of infection.  Redness resolved.  Orthopedic: Tenderness to palpation noted about the surgical site.   Radiographs: None  Assessment:   1. Hallux malleus of right foot   2. Surgery follow-up    Plan:  Patient was evaluated and treated and all questions answered.  S/p foot surgery right -Progressing as expected post-operatively. -XR: None -WB Status: WBAT in surgical shoe -Sutures: Intact. Not ready for removal today. -Medications: none refilled. -Foot redressed.  Return in about 1 week (around 06/07/2018)  for price patient.

## 2018-06-08 ENCOUNTER — Ambulatory Visit (INDEPENDENT_AMBULATORY_CARE_PROVIDER_SITE_OTHER): Payer: Medicare Other | Admitting: Podiatry

## 2018-06-08 ENCOUNTER — Encounter: Payer: Self-pay | Admitting: Podiatry

## 2018-06-08 ENCOUNTER — Ambulatory Visit (INDEPENDENT_AMBULATORY_CARE_PROVIDER_SITE_OTHER): Payer: Medicare Other

## 2018-06-08 DIAGNOSIS — M2031 Hallux varus (acquired), right foot: Secondary | ICD-10-CM

## 2018-06-08 DIAGNOSIS — Z09 Encounter for follow-up examination after completed treatment for conditions other than malignant neoplasm: Secondary | ICD-10-CM

## 2018-06-23 NOTE — Progress Notes (Signed)
  Subjective:  Patient ID: Logan Hobbs, male    DOB: 1934/12/21,  MRN: [id number]  Chief Complaint  Patient presents with  . Routine Post Op    DOS 05/23/2018 Hammertoe Repair Hallux Rt   "Its feeling good, there is just a sore spot under the ball of my foot"    DOS: 05/23/2018 Procedure: Rt hallux hammertoe repair  84 y.o. male returns for post-op check. Denies new issues  Review of Systems: Negative except as noted in the HPI. Denies N/V/F/Ch.  Past Medical History:  Diagnosis Date  . Diabetes mellitus without complication ([facility])     Current Outpatient Medications:  .  cycloSPORINE (RESTASIS) 0.05 % ophthalmic emulsion, Place 1 drop into both eyes 2 (two) times daily., Disp: , Rfl:  .  L-Methylfolate-Algae-B12-B6 (METANX) 3-90.314-2-35 MG CAPS, TK 1 C PO twice daily, Disp: , Rfl:  .  LACTOBACILLUS PO, Take 1 capsule by mouth. , Disp: , Rfl:  .  Lancet Devices (BAYER MICROLET 2 LANCING DEVIC) MISC, Check blood sugar once daily, Disp: , Rfl:  .  losartan (COZAAR) 25 MG tablet, Take 25 mg by mouth daily. , Disp: , Rfl:  .  ondansetron (ZOFRAN) 4 MG tablet, Take 1 tablet (4 mg total) by mouth every 8 (eight) hours as needed for nausea or vomiting., Disp: 20 tablet, Rfl: 0 .  ONETOUCH DELICA LANCETS 26R MISC, CHECK BLOOD SUGAR ONCE DAILY, Disp: , Rfl:  .  rosuvastatin (CRESTOR) 5 MG tablet, Take 5 mg by mouth daily at 6 PM. , Disp: , Rfl:  .  SILDENAFIL CITRATE PO, Take by mouth., Disp: , Rfl:  .  sitaGLIPtin-metformin (JANUMET) 50-1000 MG tablet, TAKE 1 TABLET TWICE DAILY  WITH MEALS, Disp: , Rfl:   Social History   Tobacco Use  Smoking Status Never Smoker  Smokeless Tobacco Never Used    Allergies  Allergen Reactions  . Latex Shortness Of Breath    Skin itching and breathing problems  . Ciprofloxacin     constipation  . Seasonal Ic [Cholestatin]   . Solifenacin Succinate     constipation  . Tamsulosin Hcl     Weakness, vertigo, and nausea   Objective:   There  were no vitals filed for this visit. There is no height or weight on file to calculate BMI. Constitutional Well developed. Well nourished.  Vascular Foot warm and well perfused. Capillary refill normal to all digits.   Neurologic Normal speech. Oriented to person, place, and time. Epicritic sensation to light touch grossly present bilaterally.  Dermatologic Skin healing well  Orthopedic: No tenderness to palpation noted about the surgical site.   Radiographs: Taken and reviewed c/w post-op state. Assessment:   1. Hallux malleus of right foot   2. Surgery follow-up    Plan:  Patient was evaluated and treated and all questions answered.  S/p foot surgery right -Progressing as expected post-operatively. -XR: None -WB Status: WBAT in surgical shoe. Transition to normal shoes in 2 weeks. -Sutures: out -Medications: none refilled. -Foot redressed.  Return in about 1 month (around 07/07/2018) for Post-op no XRs.

## 2018-06-25 ENCOUNTER — Telehealth: Payer: Self-pay | Admitting: Podiatry

## 2018-06-26 ENCOUNTER — Ambulatory Visit (INDEPENDENT_AMBULATORY_CARE_PROVIDER_SITE_OTHER): Payer: Medicare Other

## 2018-06-26 ENCOUNTER — Ambulatory Visit (INDEPENDENT_AMBULATORY_CARE_PROVIDER_SITE_OTHER): Payer: Medicare Other | Admitting: Sports Medicine

## 2018-06-26 ENCOUNTER — Encounter: Payer: Self-pay | Admitting: Sports Medicine

## 2018-06-26 VITALS — BP 137/80 | HR 74 | Resp 16

## 2018-06-26 DIAGNOSIS — M79674 Pain in right toe(s): Secondary | ICD-10-CM

## 2018-06-26 DIAGNOSIS — Z09 Encounter for follow-up examination after completed treatment for conditions other than malignant neoplasm: Secondary | ICD-10-CM

## 2018-06-26 DIAGNOSIS — M2031 Hallux varus (acquired), right foot: Secondary | ICD-10-CM

## 2018-06-26 DIAGNOSIS — M19079 Primary osteoarthritis, unspecified ankle and foot: Secondary | ICD-10-CM

## 2018-06-26 MED ORDER — AMOXICILLIN-POT CLAVULANATE 875-125 MG PO TABS: 1.0000 | ORAL_TABLET | Freq: Two times a day (BID) | ORAL | 0 refills | Status: DC

## 2018-06-26 MED ORDER — IBUPROFEN 800 MG PO TABS: 800.0000 mg | ORAL_TABLET | Freq: Three times a day (TID) | ORAL | 0 refills | Status: DC | PRN

## 2018-06-26 NOTE — Progress Notes (Signed)
Subjective: Logan Hobbs is a 84 y.o. male patient seen today in office for right toe pain and swelling patient is status post right hallux hammertoe repair with Dr. March Rummage that was performed on 05/23/2018.  Patient reports that since his last visit has transition to using a tennis shoe and has noticed more swelling pain and rubbing at the first toe and is concerned because now in addition to the first toe hurting the right second and third toes also occasionally hurt as well.  Patient reports that he did not have pain when he was in his postop shoe and does not recall any new injury however prior to surgery does report a motorized scooter running over his right foot before he had surgery but nothing since after having surgery.  Patient reports that he has been taking ibuprofen which helps however Tylenol does not help as much states that pain right now is 8 out of 10 and does admit to a history of diabetes with last A1c recorded of 6.2.  Patient denies calf pain, denies headache, chest pain, shortness of breath, nausea, vomiting, fever, or chills. No other issues noted.   Patient Active Problem List   Diagnosis Date Noted  . Cancer ([place]) 05/16/2018  . Hearing loss 05/16/2018  . Hyperlipidemia 05/16/2018  . Onychomycosis 05/16/2018  . Tinea cruris 05/16/2018  . Vertigo 05/16/2018  . Pre-operative cardiovascular examination 05/16/2018  . Essential hypertension 05/16/2018  . Diabetes mellitus with coincident hypertension ([place]) 03/31/2017  . Blood pressure alteration 12/15/2016  . Benign localized prostatic hyperplasia without lower urinary tract symptoms (LUTS) 09/13/2016  . Male erectile dysfunction, unspecified 09/13/2016  . 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 Gait abnormality 03/15/2015  . Lumbar degenerative disc disease 03/15/2015  . Neuropathy 06/20/2014  . Vitamin D insufficiency 05/27/2013  . Osteoarthritis 05/17/2013  . Diabetes mellitus with neuropathy ([place]) 02/14/2013  . Senile  nuclear sclerosis 01/30/2013  . Anxiety 12/13/2012  . Colon polyp 02/13/2012  . Allergic rhinitis 02/02/2011  . Malignant neoplasm of prostate ([place]) 10/26/2010    Current Outpatient Medications on File Prior to Visit  Medication Sig Dispense Refill  . cycloSPORINE (RESTASIS) 0.05 % ophthalmic emulsion Place 1 drop into both eyes 2 (two) times daily.    Marland Kitchen L-Methylfolate-Algae-B12-B6 (METANX) 3-90.314-2-35 MG CAPS TK 1 C PO twice daily    . LACTOBACILLUS PO Take 1 capsule by mouth.     Elmore Guise Devices (BAYER MICROLET 2 LANCING DEVIC) MISC Check blood sugar once daily    . losartan (COZAAR) 25 MG tablet Take 25 mg by mouth daily.     . ondansetron (ZOFRAN) 4 MG tablet Take 1 tablet (4 mg total) by mouth every 8 (eight) hours as needed for nausea or vomiting. 20 tablet 0  . ONETOUCH DELICA LANCETS 61P MISC CHECK BLOOD SUGAR ONCE DAILY    . rosuvastatin (CRESTOR) 5 MG tablet Take 5 mg by mouth daily at 6 PM.     . SILDENAFIL CITRATE PO Take by mouth.    . sitaGLIPtin-metformin (JANUMET) 50-1000 MG tablet TAKE 1 TABLET TWICE DAILY  WITH MEALS     No current facility-administered medications on file prior to visit.     Allergies  Allergen Reactions  . Latex Shortness Of Breath    Skin itching and breathing problems  . Ciprofloxacin     constipation  . Seasonal Ic [Cholestatin]   . Solifenacin Succinate     constipation  . Tamsulosin Hcl     Weakness, vertigo, and  nausea    Objective: There were no vitals filed for this visit.  General: No acute distress, AAOx3  Right foot: Incision dorsal hallux scabbed over with no gapping or dehiscence at surgical site, mild swelling to great toe, blanchable erythema, no warmth, no drainage, no other signs of infection noted, Capillary fill time <3 seconds in all digits, gross sensation present via light touch to right foot however there is subjective pain to the right second and third toes at the distal tuft.  There is mild guarding due to pain  at right first through third toes. No pain with calf compression.   Post Op Xray, Right foot: Previous surgery changes at hallux IPJ with history of arthritis, there is no acute fracture or dislocation noted at the right second and third toes. Soft tissue swelling within normal limits for post op status.  No other acute findings.  Assessment and Plan:  Problem List Items Addressed This Visit    None    Visit Diagnoses    Hallux malleus of right foot    -  Primary   Relevant Orders   DG Foot Complete Right (Completed)   Arthritis of big toe       Relevant Medications   ibuprofen (ADVIL,MOTRIN) 800 MG tablet   Pain in toe of right foot       Surgery follow-up           -Patient seen and evaluated -X-rays reviewed -Advised patient that he may have transitioned back to his normal tennis shoes too quickly and this may be increasing the motion at the surgical joint which could be causing increased swelling and pain and compensation by transfer overload to right second and third toes -Increase Motrin to 800 mg as needed 3 times per day -Recommend rest ice elevation -Recommend patient to return to using postoperative shoe in the evening however may continue with tennis shoe during the morning and early day as long as it is comfortable -Prescribed Augmentin for preventative measures even though I feel as if the swelling and redness is related to postsurgical healing and overuse of toe versus infection -Advised patient to limit activity to tolerance -Will plan for patient to follow-up with Dr. March Rummage for continued postoperative care as scheduled at next office visit. In the meantime, patient to call office if any issues or problems arise.   Landis Martins, DPM

## 2018-06-29 ENCOUNTER — Ambulatory Visit: Payer: Medicare Other | Admitting: Podiatry

## 2018-07-06 ENCOUNTER — Ambulatory Visit (INDEPENDENT_AMBULATORY_CARE_PROVIDER_SITE_OTHER): Payer: Medicare Other | Admitting: Podiatry

## 2018-07-06 ENCOUNTER — Other Ambulatory Visit: Payer: Self-pay

## 2018-07-06 ENCOUNTER — Ambulatory Visit (INDEPENDENT_AMBULATORY_CARE_PROVIDER_SITE_OTHER): Payer: Medicare Other

## 2018-07-06 DIAGNOSIS — M79675 Pain in left toe(s): Secondary | ICD-10-CM | POA: Diagnosis not present

## 2018-07-06 DIAGNOSIS — Z9889 Other specified postprocedural states: Secondary | ICD-10-CM | POA: Diagnosis not present

## 2018-07-06 DIAGNOSIS — B351 Tinea unguium: Secondary | ICD-10-CM

## 2018-07-06 DIAGNOSIS — M2031 Hallux varus (acquired), right foot: Secondary | ICD-10-CM | POA: Diagnosis not present

## 2018-07-06 DIAGNOSIS — M79674 Pain in right toe(s): Secondary | ICD-10-CM | POA: Diagnosis not present

## 2018-07-24 NOTE — Progress Notes (Signed)
Subjective:  Patient ID: Logan Hobbs, male    DOB: [birth year],  MRN: [id number]  Chief Complaint  Patient presents with  . Routine Post Op    DOS 05/23/2018 Hammertoe Repair Hallux Rt. Pt states finished antibiotics and redness/swelling has decreased, still some aching.  . Nail Problem    Nail trim 1-5 bilateral    DOS: 05/23/2018 Procedure: Rt hallux hammertoe repair  84 y.o. male returns for post-op check. Requests care of painful nails today.  Review of Systems: Negative except as noted in the HPI. Denies N/V/F/Ch.  Past Medical History:  Diagnosis Date  . Diabetes mellitus without complication ([place])     Current Outpatient Medications:  .  amoxicillin-clavulanate (AUGMENTIN) 875-125 MG tablet, Take 1 tablet by mouth 2 (two) times daily., Disp: 14 tablet, Rfl: 0 .  cycloSPORINE (RESTASIS) 0.05 % ophthalmic emulsion, Place 1 drop into both eyes 2 (two) times daily., Disp: , Rfl:  .  ibuprofen (ADVIL,MOTRIN) 800 MG tablet, Take 1 tablet (800 mg total) by mouth every 8 (eight) hours as needed., Disp: 30 tablet, Rfl: 0 .  L-Methylfolate-Algae-B12-B6 (METANX) 3-90.314-2-35 MG CAPS, TK 1 C PO twice daily, Disp: , Rfl:  .  LACTOBACILLUS PO, Take 1 capsule by mouth. , Disp: , Rfl:  .  Lancet Devices (BAYER MICROLET 2 LANCING DEVIC) MISC, Check blood sugar once daily, Disp: , Rfl:  .  losartan (COZAAR) 25 MG tablet, Take 25 mg by mouth daily. , Disp: , Rfl:  .  ondansetron (ZOFRAN) 4 MG tablet, Take 1 tablet (4 mg total) by mouth every 8 (eight) hours as needed for nausea or vomiting., Disp: 20 tablet, Rfl: 0 .  ONETOUCH DELICA LANCETS 18E MISC, CHECK BLOOD SUGAR ONCE DAILY, Disp: , Rfl:  .  rosuvastatin (CRESTOR) 5 MG tablet, Take 5 mg by mouth daily at 6 PM. , Disp: , Rfl:  .  SILDENAFIL CITRATE PO, Take by mouth., Disp: , Rfl:  .  sitaGLIPtin-metformin (JANUMET) 50-1000 MG tablet, TAKE 1 TABLET TWICE DAILY  WITH MEALS, Disp: , Rfl:   Social History   Tobacco Use  Smoking  Status Never Smoker  Smokeless Tobacco Never Used    Allergies  Allergen Reactions  . Latex Shortness Of Breath    Skin itching and breathing problems  . Ciprofloxacin     constipation  . Seasonal Ic [Cholestatin]   . Solifenacin Succinate     constipation  . Tamsulosin Hcl     Weakness, vertigo, and nausea   Objective:   There were no vitals filed for this visit. There is no height or weight on file to calculate BMI. Constitutional Well developed. Well nourished.  Vascular Foot warm and well perfused. Capillary refill normal to all digits.   Neurologic Normal speech. Oriented to person, place, and time. Epicritic sensation to light touch grossly present bilaterally.  Dermatologic Skin healing well. Nails thickened elongated pain to palpation  Orthopedic: No tenderness to palpation noted about the surgical site.   Radiographs: Taken and reviewed c/w post-op state. Assessment:   1. Hallux malleus of right foot   2. Status post right foot surgery   3. Pain due to onychomycosis of toenails of both feet    Plan:  Patient was evaluated and treated and all questions answered.  S/p foot surgery right -Progressing as expected post-operatively. -XR as above -WBAT in normal shoe  Onychomycosis with pain -Nails debrided x10  Procedure: Nail Debridement Rationale: pain Type of Debridement: manual, sharp debridement. Instrumentation: Nail nipper,  rotary burr. Number of Nails: 10    No follow-ups on file.

## 2018-08-31 ENCOUNTER — Other Ambulatory Visit: Payer: Self-pay

## 2018-08-31 ENCOUNTER — Encounter: Payer: Self-pay | Admitting: Podiatry

## 2018-08-31 ENCOUNTER — Ambulatory Visit (INDEPENDENT_AMBULATORY_CARE_PROVIDER_SITE_OTHER): Payer: Medicare Other | Admitting: Podiatry

## 2018-08-31 VITALS — Temp 98.1°F

## 2018-08-31 DIAGNOSIS — M79674 Pain in right toe(s): Secondary | ICD-10-CM

## 2018-08-31 DIAGNOSIS — M79675 Pain in left toe(s): Secondary | ICD-10-CM

## 2018-08-31 DIAGNOSIS — M2031 Hallux varus (acquired), right foot: Secondary | ICD-10-CM

## 2018-08-31 DIAGNOSIS — B351 Tinea unguium: Secondary | ICD-10-CM

## 2018-08-31 NOTE — Progress Notes (Signed)
Subjective:  Patient ID: Logan Hobbs, male    DOB: [birth year],  MRN: [id number]  Chief Complaint  Patient presents with  . Routine Post Op    pt is here for aroutine post op, pt is feeling better, but is still feeling sore on the right big toe, pt is also having some swelling in the right big toe    DOS: 05/23/2018 Procedure: Rt hallux hammertoe repair  84 y.o. male returns for follow up. Requesting care of his nails states they are long and thick and causing pain. Has occasional soreness to the right great toe but can ambulate in normal shoes without issue.  Review of Systems: Negative except as noted in the HPI. Denies N/V/F/Ch.  Past Medical History:  Diagnosis Date  . Diabetes mellitus without complication (HCC)     Current Outpatient Medications:  .  amoxicillin (AMOXIL) 875 MG tablet, , Disp: , Rfl:  .  amoxicillin-clavulanate (AUGMENTIN) 875-125 MG tablet, Take 1 tablet by mouth 2 (two) times daily., Disp: 14 tablet, Rfl: 0 .  cycloSPORINE (RESTASIS) 0.05 % ophthalmic emulsion, Place 1 drop into both eyes 2 (two) times daily., Disp: , Rfl:  .  ibuprofen (ADVIL,MOTRIN) 800 MG tablet, Take 1 tablet (800 mg total) by mouth every 8 (eight) hours as needed., Disp: 30 tablet, Rfl: 0 .  L-Methylfolate-Algae-B12-B6 (METANX) 3-90.314-2-35 MG CAPS, TK 1 C PO twice daily, Disp: , Rfl:  .  LACTOBACILLUS PO, Take 1 capsule by mouth. , Disp: , Rfl:  .  Lancet Devices (BAYER MICROLET 2 LANCING DEVIC) MISC, Check blood sugar once daily, Disp: , Rfl:  .  losartan (COZAAR) 25 MG tablet, Take 25 mg by mouth daily. , Disp: , Rfl:  .  ondansetron (ZOFRAN) 4 MG tablet, Take 1 tablet (4 mg total) by mouth every 8 (eight) hours as needed for nausea or vomiting., Disp: 20 tablet, Rfl: 0 .  ONETOUCH DELICA LANCETS 09X MISC, CHECK BLOOD SUGAR ONCE DAILY, Disp: , Rfl:  .  rosuvastatin (CRESTOR) 5 MG tablet, Take 5 mg by mouth daily at 6 PM. , Disp: , Rfl:  .  SILDENAFIL CITRATE PO, Take by mouth.,  Disp: , Rfl:  .  sitaGLIPtin-metformin (JANUMET) 50-1000 MG tablet, TAKE 1 TABLET TWICE DAILY  WITH MEALS, Disp: , Rfl:   Social History   Tobacco Use  Smoking Status Never Smoker  Smokeless Tobacco Never Used    Allergies  Allergen Reactions  . Latex Shortness Of Breath    Skin itching and breathing problems  . Ciprofloxacin     constipation  . Seasonal Ic [Cholestatin]   . Solifenacin Succinate     constipation  . Tamsulosin Hcl     Weakness, vertigo, and nausea   Objective:   Vitals:   08/31/18 0953  Temp: 98.1 F (36.7 C)   There is no height or weight on file to calculate BMI. Constitutional Well developed. Well nourished.  Vascular Foot warm and well perfused. Capillary refill normal to all digits.   Neurologic Normal speech. Oriented to person, place, and time. Epicritic sensation to light touch grossly present bilaterally.  Dermatologic Skin healed. Thickened elongated toenails x10.  Orthopedic: No tenderness to palpation noted about the surgical site. Slight hallux malleus noted right hallux. POP about the toenails.   Radiographs: Taken and reviewed c/w post-op state. Assessment:   1. Hallux malleus of right foot   2. Pain due to onychomycosis of toenails of both feet    Plan:  Patient was evaluated  and treated and all questions answered.  S/p foot surgery right -Progressing as expected post-operatively. -Continue WBAt in normal shoe. -F/u should pain perist.  Onyhcomycosis with pain -Nails debrided x10    No follow-ups on file.

## 2019-05-28 DIAGNOSIS — M7061 Trochanteric bursitis, right hip: Secondary | ICD-10-CM | POA: Insufficient documentation

## 2019-05-28 HISTORY — DX: Trochanteric bursitis, right hip: M70.61

## 2020-09-17 ENCOUNTER — Ambulatory Visit: Payer: Medicare Other | Admitting: Podiatry

## 2020-09-18 ENCOUNTER — Encounter: Payer: Self-pay | Admitting: Podiatry

## 2020-09-18 ENCOUNTER — Ambulatory Visit (INDEPENDENT_AMBULATORY_CARE_PROVIDER_SITE_OTHER): Payer: Medicare Other | Admitting: Podiatry

## 2020-09-18 ENCOUNTER — Other Ambulatory Visit: Payer: Self-pay

## 2020-09-18 DIAGNOSIS — H9319 Tinnitus, unspecified ear: Secondary | ICD-10-CM | POA: Insufficient documentation

## 2020-09-18 DIAGNOSIS — K5900 Constipation, unspecified: Secondary | ICD-10-CM | POA: Insufficient documentation

## 2020-09-18 DIAGNOSIS — M79675 Pain in left toe(s): Secondary | ICD-10-CM

## 2020-09-18 DIAGNOSIS — R21 Rash and other nonspecific skin eruption: Secondary | ICD-10-CM

## 2020-09-18 DIAGNOSIS — Z461 Encounter for fitting and adjustment of hearing aid: Secondary | ICD-10-CM | POA: Insufficient documentation

## 2020-09-18 DIAGNOSIS — E119 Type 2 diabetes mellitus without complications: Secondary | ICD-10-CM | POA: Insufficient documentation

## 2020-09-18 DIAGNOSIS — M79674 Pain in right toe(s): Secondary | ICD-10-CM

## 2020-09-18 DIAGNOSIS — B351 Tinea unguium: Secondary | ICD-10-CM | POA: Diagnosis not present

## 2020-09-18 DIAGNOSIS — H6123 Impacted cerumen, bilateral: Secondary | ICD-10-CM | POA: Insufficient documentation

## 2020-09-18 DIAGNOSIS — Z23 Encounter for immunization: Secondary | ICD-10-CM | POA: Insufficient documentation

## 2020-09-18 DIAGNOSIS — H903 Sensorineural hearing loss, bilateral: Secondary | ICD-10-CM

## 2020-09-18 DIAGNOSIS — L84 Corns and callosities: Secondary | ICD-10-CM

## 2020-09-18 DIAGNOSIS — E669 Obesity, unspecified: Secondary | ICD-10-CM | POA: Insufficient documentation

## 2020-09-18 DIAGNOSIS — H269 Unspecified cataract: Secondary | ICD-10-CM

## 2020-09-18 HISTORY — DX: Type 2 diabetes mellitus without complications: E11.9

## 2020-09-18 HISTORY — DX: Sensorineural hearing loss, bilateral: H90.3

## 2020-09-18 HISTORY — DX: Unspecified cataract: H26.9

## 2020-09-18 HISTORY — DX: Constipation, unspecified: K59.00

## 2020-09-18 HISTORY — DX: Encounter for fitting and adjustment of hearing aid: Z46.1

## 2020-09-18 HISTORY — DX: Impacted cerumen, bilateral: H61.23

## 2020-09-18 HISTORY — DX: Rash and other nonspecific skin eruption: R21

## 2020-09-18 HISTORY — DX: Obesity, unspecified: E66.9

## 2020-09-18 HISTORY — DX: Encounter for immunization: Z23

## 2020-09-18 HISTORY — DX: Tinnitus, unspecified ear: H93.19

## 2020-09-18 NOTE — Progress Notes (Signed)
Subjective:   Patient ID: Logan Hobbs, male   DOB: 86 y.o.   MRN: [id number]   HPI Patient presents with caregiver with very thickened nailbeds 1-5 both feet that get irritated and lesions on both feet that can be painful.  Cannot take care of himself   ROS      Objective:  Physical Exam  Neurovascular status found to be intact with patient found to have thick yellow brittle nailbeds 1-5 both feet and is also noted to have keratotic lesion third digit left subfirst fifth metatarsal painful     Assessment:  Chronic mycotic nail infection 1-5 both feet with pain with at risk condition along with lesion formation     Plan:  Debridement nailbeds 1-5 both feet neurogenic bleeding debridement lesions bilateral no iatrogenic bleeding

## 2020-12-02 ENCOUNTER — Encounter: Payer: Self-pay | Admitting: Podiatry

## 2020-12-02 ENCOUNTER — Other Ambulatory Visit: Payer: Self-pay

## 2020-12-02 ENCOUNTER — Ambulatory Visit (INDEPENDENT_AMBULATORY_CARE_PROVIDER_SITE_OTHER): Payer: Medicare Other | Admitting: Podiatry

## 2020-12-02 DIAGNOSIS — M79674 Pain in right toe(s): Secondary | ICD-10-CM

## 2020-12-02 DIAGNOSIS — B351 Tinea unguium: Secondary | ICD-10-CM

## 2020-12-02 DIAGNOSIS — M79675 Pain in left toe(s): Secondary | ICD-10-CM | POA: Diagnosis not present

## 2020-12-02 DIAGNOSIS — Q6689 Other  specified congenital deformities of feet: Secondary | ICD-10-CM

## 2020-12-02 HISTORY — DX: Other specified congenital deformities of feet: Q66.89

## 2020-12-02 NOTE — Progress Notes (Addendum)
This patient returns to my office for at risk foot care.  This patient requires this care by a professional since this patient will be at risk due to having diabetic neuropathy.  This patient is unable to cut nails himself since the patient cannot reach his nails.These nails are painful walking and wearing shoes.  He also says his toes on his right foot are painful after walking. This patient presents for at risk foot care today.  General Appearance  Alert, conversant and in no acute stress.  Vascular  Dorsalis pedis and posterior tibial  pulses are palpable  bilaterally.  Capillary return is within normal limits  bilaterally. Temperature is within normal limits  bilaterally.  Neurologic  Senn-Weinstein monofilament wire test diminished  bilaterally. Muscle power within normal limits bilaterally.  Nails Thick disfigured discolored nails with subungual debris  from hallux to fifth toes bilaterally. No evidence of bacterial infection or drainage bilaterally.  Orthopedic  No limitations of motion  feet .  No crepitus or effusions noted.  No bony pathology or digital deformities noted.Hammer toes 2-4  B/L.  Skin  normotropic skin with no porokeratosis noted bilaterally.  No signs of infections or ulcers noted.     Onychomycosis  Pain in right toes  Pain in left toes  Hammer toes  B/L.  Consent was obtained for treatment procedures.   Mechanical debridement of nails 1-5  bilaterally performed with a nail nipper.  Filed with dremel without incident.  Discussed the hammer toes with this patient.  Told him to reappoint with Dr.  March Rummage for surgical evaluation.  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   Return office visit      3 months.               Told patient to return for periodic foot care and evaluation due to potential at risk complications.   To make an appointment to be measured for diabetic shoes.

## 2020-12-03 ENCOUNTER — Ambulatory Visit (INDEPENDENT_AMBULATORY_CARE_PROVIDER_SITE_OTHER): Payer: Medicare Other

## 2020-12-03 DIAGNOSIS — E119 Type 2 diabetes mellitus without complications: Secondary | ICD-10-CM

## 2020-12-03 NOTE — Progress Notes (Signed)
Patient seen in office today for diabetic shoe selection and custom insert measurements. Patient wears a size 11 wide (2E) men's shoe and Logan Stalker, PA is currently treating patient's diabetes. Patient is unsure about which provider Loma Sousa works under. Patient selected [place] as his 1st choice and [phone] [employer] boss runner as his 2nd choice for shoes. Advised patient the office will call when shoes are available for pick-up. Patient verbalized understanding.

## 2020-12-21 ENCOUNTER — Encounter (HOSPITAL_COMMUNITY): Payer: Self-pay | Admitting: Internal Medicine

## 2020-12-21 ENCOUNTER — Emergency Department (HOSPITAL_COMMUNITY): Payer: Medicare Other

## 2020-12-21 ENCOUNTER — Other Ambulatory Visit: Payer: Self-pay

## 2020-12-21 ENCOUNTER — Observation Stay (HOSPITAL_COMMUNITY)
Admission: EM | Admit: 2020-12-21 | Discharge: 2020-12-22 | Disposition: A | Discharge status: Home / Self Care | Payer: Medicare Other | Attending: Internal Medicine | Admitting: Internal Medicine

## 2020-12-21 DIAGNOSIS — Z9181 History of falling: Secondary | ICD-10-CM | POA: Insufficient documentation

## 2020-12-21 DIAGNOSIS — Z91040 Latex allergy status: Secondary | ICD-10-CM | POA: Insufficient documentation

## 2020-12-21 DIAGNOSIS — Z8546 Personal history of malignant neoplasm of prostate: Secondary | ICD-10-CM | POA: Insufficient documentation

## 2020-12-21 DIAGNOSIS — R55 Syncope and collapse: Secondary | ICD-10-CM

## 2020-12-21 DIAGNOSIS — E871 Hypo-osmolality and hyponatremia: Secondary | ICD-10-CM | POA: Diagnosis not present

## 2020-12-21 DIAGNOSIS — E119 Type 2 diabetes mellitus without complications: Secondary | ICD-10-CM | POA: Insufficient documentation

## 2020-12-21 DIAGNOSIS — Z20822 Contact with and (suspected) exposure to covid-19: Secondary | ICD-10-CM | POA: Diagnosis not present

## 2020-12-21 DIAGNOSIS — E1169 Type 2 diabetes mellitus with other specified complication: Secondary | ICD-10-CM

## 2020-12-21 DIAGNOSIS — Z79899 Other long term (current) drug therapy: Secondary | ICD-10-CM | POA: Insufficient documentation

## 2020-12-21 DIAGNOSIS — S0081XA Abrasion of other part of head, initial encounter: Secondary | ICD-10-CM | POA: Insufficient documentation

## 2020-12-21 DIAGNOSIS — S80212A Abrasion, left knee, initial encounter: Secondary | ICD-10-CM | POA: Diagnosis not present

## 2020-12-21 DIAGNOSIS — Z7984 Long term (current) use of oral hypoglycemic drugs: Secondary | ICD-10-CM | POA: Diagnosis not present

## 2020-12-21 DIAGNOSIS — E785 Hyperlipidemia, unspecified: Secondary | ICD-10-CM | POA: Diagnosis not present

## 2020-12-21 DIAGNOSIS — I10 Essential (primary) hypertension: Secondary | ICD-10-CM | POA: Insufficient documentation

## 2020-12-21 DIAGNOSIS — W1839XA Other fall on same level, initial encounter: Secondary | ICD-10-CM | POA: Insufficient documentation

## 2020-12-21 DIAGNOSIS — S80211A Abrasion, right knee, initial encounter: Secondary | ICD-10-CM | POA: Diagnosis not present

## 2020-12-21 HISTORY — DX: Syncope and collapse: R55

## 2020-12-21 LAB — URINALYSIS, ROUTINE W REFLEX MICROSCOPIC
Bilirubin Urine: NEGATIVE
Glucose, UA: 50 mg/dL — AB
Hgb urine dipstick: NEGATIVE
Ketones, ur: NEGATIVE mg/dL
Leukocytes,Ua: NEGATIVE
Nitrite: NEGATIVE
Protein, ur: NEGATIVE mg/dL
Specific Gravity, Urine: 1.012 (ref 1.005–1.030)
pH: 7 (ref 5.0–8.0)

## 2020-12-21 LAB — CBC
HCT: 40.8 % (ref 39.0–52.0)
Hemoglobin: 14.1 g/dL (ref 13.0–17.0)
MCH: 33.9 pg (ref 26.0–34.0)
MCHC: 34.6 g/dL (ref 30.0–36.0)
MCV: 98.1 fL (ref 80.0–100.0)
Platelets: 305 10*3/uL (ref 150–400)
RBC: 4.16 MIL/uL — ABNORMAL LOW (ref 4.22–5.81)
RDW: 13.6 % (ref 11.5–15.5)
WBC: 7.9 10*3/uL (ref 4.0–10.5)
nRBC: 0 % (ref 0.0–0.2)

## 2020-12-21 LAB — URINALYSIS, COMPLETE (UACMP) WITH MICROSCOPIC
Bacteria, UA: NONE SEEN
Bilirubin Urine: NEGATIVE
Glucose, UA: 50 mg/dL — AB
Ketones, ur: NEGATIVE mg/dL
Leukocytes,Ua: NEGATIVE
Nitrite: NEGATIVE
Protein, ur: NEGATIVE mg/dL
Specific Gravity, Urine: 1.01 (ref 1.005–1.030)
pH: 7 (ref 5.0–8.0)

## 2020-12-21 LAB — BASIC METABOLIC PANEL
Anion gap: 9 (ref 5–15)
BUN: 14 mg/dL (ref 8–23)
CO2: 28 mmol/L (ref 22–32)
Calcium: 9 mg/dL (ref 8.9–10.3)
Chloride: 95 mmol/L — ABNORMAL LOW (ref 98–111)
Creatinine, Ser: 0.86 mg/dL (ref 0.61–1.24)
GFR, Estimated: >60 mL/min (ref >60)
Glucose, Bld: 146 mg/dL — ABNORMAL HIGH (ref 70–99)
Potassium: 4 mmol/L (ref 3.5–5.1)
Sodium: 132 mmol/L — ABNORMAL LOW (ref 135–145)

## 2020-12-21 LAB — RAPID URINE DRUG SCREEN, HOSP PERFORMED
Amphetamines: NOT DETECTED
Barbiturates: NOT DETECTED
Benzodiazepines: NOT DETECTED
Cocaine: NOT DETECTED
Opiates: NOT DETECTED
Tetrahydrocannabinol: NOT DETECTED

## 2020-12-21 LAB — HEPATIC FUNCTION PANEL
ALT: 19 U/L (ref 0–44)
AST: 21 U/L (ref 15–41)
Albumin: 3.7 g/dL (ref 3.5–5.0)
Alkaline Phosphatase: 48 U/L (ref 38–126)
Bilirubin, Direct: 0.1 mg/dL (ref 0.0–0.2)
Indirect Bilirubin: 0.7 mg/dL (ref 0.3–0.9)
Total Bilirubin: 0.8 mg/dL (ref 0.3–1.2)
Total Protein: 6.9 g/dL (ref 6.5–8.1)

## 2020-12-21 LAB — TROPONIN I (HIGH SENSITIVITY)
Troponin I (High Sensitivity): 6 ng/L (ref <18)
Troponin I (High Sensitivity): 6 ng/L (ref <18)

## 2020-12-21 LAB — RESP PANEL BY RT-PCR (FLU A&B, COVID) ARPGX2
Influenza A by PCR: NEGATIVE
Influenza B by PCR: NEGATIVE
SARS Coronavirus 2 by RT PCR: NEGATIVE

## 2020-12-21 LAB — CBG MONITORING, ED
Glucose-Capillary: 201 mg/dL — ABNORMAL HIGH (ref 70–99)
Glucose-Capillary: 189 mg/dL — ABNORMAL HIGH (ref 70–99)

## 2020-12-21 LAB — TSH: TSH: 0.647 u[IU]/mL (ref 0.350–4.500)

## 2020-12-21 MED ORDER — INSULIN ASPART 100 UNIT/ML IJ SOLN
0.0000 [IU] | Freq: Three times a day (TID) | INTRAMUSCULAR | Status: DC
  Administered 2020-12-22 (×2): 2 [IU] via SUBCUTANEOUS
  Filled 2020-12-21: qty 0.09

## 2020-12-21 MED ORDER — OXYCODONE HCL 5 MG PO TABS: 5.0000 mg | ORAL_TABLET | ORAL | Status: DC | PRN

## 2020-12-21 MED ORDER — ACETAMINOPHEN 325 MG PO TABS
650.0000 mg | ORAL_TABLET | Freq: Four times a day (QID) | ORAL | Status: DC | PRN
  Administered 2020-12-21 – 2020-12-22 (×2): 650 mg via ORAL
  Filled 2020-12-21 (×2): qty 2

## 2020-12-21 MED ORDER — ONDANSETRON HCL 4 MG PO TABS: 4.0000 mg | ORAL_TABLET | Freq: Four times a day (QID) | ORAL | Status: DC | PRN

## 2020-12-21 MED ORDER — ALBUTEROL SULFATE HFA 108 (90 BASE) MCG/ACT IN AERS: 1.0000 | INHALATION_SPRAY | Freq: Four times a day (QID) | RESPIRATORY_TRACT | Status: DC | PRN

## 2020-12-21 MED ORDER — INSULIN ASPART 100 UNIT/ML IJ SOLN
0.0000 [IU] | Freq: Every day | INTRAMUSCULAR | Status: DC
  Filled 2020-12-21: qty 0.05

## 2020-12-21 MED ORDER — ACETAMINOPHEN 325 MG PO TABS
650.0000 mg | ORAL_TABLET | Freq: Once | ORAL | Status: AC
  Administered 2020-12-21: 650 mg via ORAL
  Filled 2020-12-21: qty 2

## 2020-12-21 MED ORDER — SODIUM CHLORIDE 0.9 % IV SOLN: INTRAVENOUS | Status: DC

## 2020-12-21 MED ORDER — ROSUVASTATIN CALCIUM 10 MG PO TABS: 10.0000 mg | ORAL_TABLET | Freq: Every day | ORAL | Status: DC

## 2020-12-21 MED ORDER — POLYETHYLENE GLYCOL 3350 17 G PO PACK
17.0000 g | PACK | Freq: Every day | ORAL | Status: DC | PRN
  Administered 2020-12-21: 17 g via ORAL
  Filled 2020-12-21: qty 1

## 2020-12-21 MED ORDER — ROSUVASTATIN CALCIUM 5 MG PO TABS: 5.0000 mg | ORAL_TABLET | Freq: Every day | ORAL | Status: DC

## 2020-12-21 MED ORDER — ALBUTEROL SULFATE (2.5 MG/3ML) 0.083% IN NEBU: 2.5000 mg | INHALATION_SOLUTION | Freq: Four times a day (QID) | RESPIRATORY_TRACT | Status: DC | PRN

## 2020-12-21 MED ORDER — ACETAMINOPHEN 650 MG RE SUPP: 650.0000 mg | Freq: Four times a day (QID) | RECTAL | Status: DC | PRN

## 2020-12-21 MED ORDER — SODIUM CHLORIDE 0.9 % IV BOLUS
1000.0000 mL | Freq: Once | INTRAVENOUS | Status: AC
  Administered 2020-12-21: 1000 mL via INTRAVENOUS

## 2020-12-21 MED ORDER — ESCITALOPRAM OXALATE 10 MG PO TABS
5.0000 mg | ORAL_TABLET | Freq: Every day | ORAL | Status: DC
  Administered 2020-12-21 – 2020-12-22 (×2): 5 mg via ORAL
  Filled 2020-12-21 (×2): qty 1

## 2020-12-21 MED ORDER — SODIUM CHLORIDE 0.9% FLUSH
3.0000 mL | Freq: Two times a day (BID) | INTRAVENOUS | Status: DC
  Administered 2020-12-21 – 2020-12-22 (×2): 3 mL via INTRAVENOUS

## 2020-12-21 MED ORDER — ENOXAPARIN SODIUM 40 MG/0.4ML IJ SOSY
40.0000 mg | PREFILLED_SYRINGE | INTRAMUSCULAR | Status: DC
  Administered 2020-12-21: 40 mg via SUBCUTANEOUS
  Filled 2020-12-21: qty 0.4

## 2020-12-21 MED ORDER — ONDANSETRON HCL 4 MG/2ML IJ SOLN: 4.0000 mg | Freq: Four times a day (QID) | INTRAMUSCULAR | Status: DC | PRN

## 2020-12-21 NOTE — ED Notes (Signed)
Attempted to call pt's daughter, Kyra Manges. No answer.

## 2020-12-21 NOTE — ED Triage Notes (Signed)
Pt presents to ED via EMS cc syncope. Pt states he got out of his car at the dentist and passed out in the parking lot. Pt noted to have abrasions to bilateral knees and chin, No active bleeding. Respirations equal, unlabored. A&ox4.

## 2020-12-21 NOTE — ED Notes (Signed)
Logan Hobbs [phone] Patients wife would like a call back with an update

## 2020-12-21 NOTE — H&P (Signed)
History and Physical    Logan Hobbs VCB:[id number] DOB: 06-09-1934 DOA: 12/21/2020  PCP: Marda Stalker, PA-C   Patient coming from: Home  Chief Complaint: Fall and Dizziness  HPI: Logan Hobbs is a 86 y.o. male with medical history significant for but not limited to diabetes mellitus type 2 with neuropathy, hypertension, hyperlipidemia, erectile dysfunction, as well as other comorbidities who presents with a chief complaint of a fall and dizziness.  States that he was going onto his dentist office and he got to the parking lot where he got out of his car and walked a few steps and fell.  He states that he got lightheaded and dizzy when this happened and he states that he is fallen 3 times this week.  The first time he fell was a mechanical fall he tripped over his wife's wheelchair.  The second time he fell he turned and fell a states that it was similar the second time that he fell with the lightheadedness and dizziness.  States that he has not been eating and drinking today and thinks he could be dehydrated.  He does state that he does have neuropathy which does affect his gait.  States that he did not actually lose consciousness but states that he did get lightheaded and dizzy.  Denies any chest pain or shortness of breath.  He underwent multiple imaging testing with a CT of the cervical spine, CT of the head and CT maxillofacial as well as a hip x-ray and a chest x-ray.  CT head face and cervical spine showed no acute intracranial pathology but he did have small vessel white matter disease but there is no displaced fracture or dislocation of the facial bones and he did have a soft tissue contusion of the chin.  There is no fracture or static subluxation of the cervical spine.  Focally there is moderate disc space height loss and osteophyte elastosis of C5-C6 with other mild degenerative disease.  Chest x-ray showed no rib fractures.  And bilateral hip with pelvis view showed no acute  abnormality noted.  TR[facility] was asked to admit this patient for suspected syncope and fall  ED Course: In the ED had basic blood work done and was given 1 L normal saline bolus.  COVID testing was negative.  He had multiple imaging modalities as above.  He also had an EKG done.  Review of Systems: As per HPI otherwise all other systems reviewed and negative.   Past Medical History:  Diagnosis Date   Diabetes mellitus without complication [facility])    Past Surgical History:  Procedure Laterality Date   CATARACT EXTRACTION     PROSTATE SURGERY  2009   TONSILLECTOMY     WISDOM TOOTH EXTRACTION     SOCIAL HISTORY   reports that he has never smoked. He has never used smokeless tobacco. He reports current alcohol use. No history on file for drug use.  Allergies  Allergen Reactions   Latex Shortness Of Breath    Skin itching and breathing problems   Ciprofloxacin     constipation   Other    Seasonal Ic [Cholestatin]    Solifenacin Succinate     constipation   Tamsulosin Hcl     Weakness, vertigo, and nausea   FAMILY HISTORY Father had hypertension patient's mother had septicemia and both have passed away  Prior to Admission medications   Medication Sig Start Date End Date Taking? Authorizing Provider  albuterol (VENTOLIN HFA) 108 (90 Base) MCG/ACT inhaler  Inhale into the lungs. 11/24/09   [provider]  Blood Glucose Monitoring Suppl (ONE TOUCH ULTRA 2) w/Device KIT 3 (three) times daily as needed. 09/30/20   [provider]  cycloSPORINE (RESTASIS) 0.05 % ophthalmic emulsion Place 1 drop into both eyes 2 (two) times daily.    [provider]  escitalopram (LEXAPRO) 5 MG tablet Take 5 mg by mouth daily. 08/26/20   [provider]  Lancet Devices (BAYER MICROLET 2 LANCING [facility]) MISC Check blood sugar once daily 07/31/15   [provider]  losartan (COZAAR) 25 MG tablet Take 25 mg by mouth daily.  10/10/17   [provider]  ONETOUCH DELICA  LANCETS 67T MISC CHECK BLOOD SUGAR ONCE DAILY 12/09/16   [provider]  [facility] ULTRA test strip USE TO CHECK BLOOD GLUCOSE ONCE DAILY 10/02/20   [provider]  rosuvastatin (CRESTOR) 5 MG tablet Take 5 mg by mouth daily at 6 PM.  10/10/17   [provider]  sildenafil (VIAGRA) 100 MG tablet Take by mouth. 11/29/10   [provider]  sitaGLIPtin-metformin (JANUMET) 50-1000 MG tablet TAKE 1 TABLET TWICE DAILY  WITH MEALS 10/10/17   [provider]   Physical Exam: Vitals:   12/21/20 1222 12/21/20 1223 12/21/20 1300 12/21/20 1430  BP: (!) 164/92  (!) 145/91 136/66  Pulse: 66  67 (!) 57  Resp: _0 Temp: 98.7 F (37.1 C)     TempSrc: Oral     SpO2: 98%  95% 97%  Weight:  88 kg    Height:  5' 10" (1.778 m)     Constitutional: WN/WD overweight elderly Caucasian male in NAD and appears calm  Eyes: PERRL, lids and conjunctivae normal, sclerae anicteric  ENMT: External Ears, Nose appear normal. Mildly hard of hearing  Neck: Appears normal, supple, no cervical masses, normal ROM, no appreciable thyromegaly Respiratory: Slightly diminished to auscultation bilaterally, no wheezing, rales, rhonchi or crackles. Normal respiratory effort and patient is not tachypenic. No accessory muscle use.  Cardiovascular: RRR, no murmurs / rubs / gallops. S1 and S2 auscultated. No extremity edema. Abdomen: Soft, non-tender, mildly distended. Bowel sounds positive.  GU: Deferred. Musculoskeletal: No clubbing / cyanosis of digits/nails. No joint deformity upper and lower extremities.  Skin: No rashes, lesions, but has skin abrasions on the LE from fall . No induration; Warm and dry.  Neurologic: CN 2-12 grossly intact with no focal deficits. Romberg sign and cerebellar reflexes not assessed.  Psychiatric: Normal judgment and insight. Alert and oriented x 3. Normal mood and appropriate affect.   Labs on Admission: I have personally reviewed following labs and  imaging studies  CBC: Recent Labs  Lab 12/21/20 1306  WBC 7.9  HGB 14.1  HCT 40.8  MCV 98.1  PLT 245   Basic Metabolic Panel: Recent Labs  Lab 12/21/20 1306  NA 132*  K 4.0  CL 95*  CO2 28  GLUCOSE 146*  BUN 14  CREATININE 0.86  CALCIUM 9.0   GFR: Estimated Creatinine Clearance: 71.4 mL/min (by C-G formula based on SCr of 0.86 mg/dL). Liver Function Tests: Recent Labs  Lab 12/21/20 1340  AST 21  ALT 19  ALKPHOS 48  BILITOT 0.8  PROT 6.9  ALBUMIN 3.7   No results for input(s): LIPASE, AMYLASE in the last 168 hours. No results for input(s): AMMONIA in the last 168 hours. Coagulation Profile: No results for input(s): INR, PROTIME in the last 168 hours. Cardiac Enzymes: No results  for input(s): CKTOTAL, CKMB, CKMBINDEX, TROPONINI in the last 168 hours. BNP (last 3 results) No results for input(s): PROBNP in the last 8760 hours. HbA1C: No results for input(s): HGBA1C in the last 72 hours. CBG: No results for input(s): GLUCAP in the last 168 hours. Lipid Profile: No results for input(s): CHOL, HDL, LDLCALC, TRIG, CHOLHDL, LDLDIRECT in the last 72 hours. Thyroid Function Tests: No results for input(s): TSH, T4TOTAL, FREET4, T3FREE, THYROIDAB in the last 72 hours. Anemia Panel: No results for input(s): VITAMINB12, FOLATE, FERRITIN, TIBC, IRON, RETICCTPCT in the last 72 hours. Urine analysis:    Component Value Date/Time   COLORURINE YELLOW 12/21/2020 [place] 12/21/2020 1256   LABSPEC 1.012 12/21/2020 1256   PHURINE 7.0 12/21/2020 1256   GLUCOSEU 50 (A) 12/21/2020 1256   HGBUR NEGATIVE 12/21/2020 1256   [place] 12/21/2020 1256   [place] 12/21/2020 1256   PROTEINUR NEGATIVE 12/21/2020 1256   NITRITE NEGATIVE 12/21/2020 [place] 12/21/2020 1256   Sepsis Labs: !!!!!!!!!!!!!!!!!!!!!!!!!!!!!!!!!!!!!!!!!!!! _0 (procalcitonin:4,lacticidven:4) ) Recent Results (from the past 240 hour(s))   Resp Panel by RT-PCR (Flu A&B, Covid) Nasopharyngeal Swab     Status: None   Collection Time: 12/21/20  3:49 PM   Specimen: Nasopharyngeal Swab; Nasopharyngeal(NP) swabs in vial transport medium  Result Value Ref Range Status   SARS Coronavirus 2 by RT PCR NEGATIVE NEGATIVE Final    Comment: (NOTE) SARS-CoV-2 target nucleic acids are NOT DETECTED.  The SARS-CoV-2 RNA is generally detectable in upper respiratory specimens during the acute phase of infection. The lowest concentration of SARS-CoV-2 viral copies this assay can detect is 138 copies/mL. A negative result does not preclude SARS-Cov-2 infection and should not be used as the sole basis for treatment or other patient management decisions. A negative result may occur with  improper specimen collection/handling, submission of specimen other than nasopharyngeal swab, presence of viral mutation(s) within the areas targeted by this assay, and inadequate number of viral copies(<138 copies/mL). A negative result must be combined with clinical observations, patient history, and epidemiological information. The expected result is Negative.  Fact Sheet for Patients:  EntrepreneurPulse.com.au  Fact Sheet for Healthcare Providers:  IncredibleEmployment.be  This test is no t yet approved or cleared by the [place] FDA and  has been authorized for detection and/or diagnosis of SARS-CoV-2 by FDA under an Emergency Use Authorization (EUA). This EUA will remain  in effect (meaning this test can be used) for the duration of the COVID-19 declaration under Section 564(b)(1) of the Act, 21 U.S.C.section 360bbb-3(b)(1), unless the authorization is terminated  or revoked sooner.       Influenza A by PCR NEGATIVE NEGATIVE Final   Influenza B by PCR NEGATIVE NEGATIVE Final    Comment: (NOTE) The Xpert Xpress SARS-CoV-2/FLU/RSV plus assay is intended as an aid in the diagnosis of influenza from  Nasopharyngeal swab specimens and should not be used as a sole basis for treatment. Nasal washings and aspirates are unacceptable for Xpert Xpress SARS-CoV-2/FLU/RSV testing.  Fact Sheet for Patients: EntrepreneurPulse.com.au  Fact Sheet for Healthcare Providers: IncredibleEmployment.be  This test is not yet approved or cleared by the [place] FDA and has been authorized for detection and/or diagnosis of SARS-CoV-2 by FDA under an Emergency Use Authorization (EUA). This EUA will remain in effect (meaning this test can be used) for the duration of the COVID-19 declaration under Section 564(b)(1) of the Act, 21 U.S.C. section 360bbb-3(b)(1), unless the authorization is terminated or revoked.  Performed at [facility], [place] [address], [place], [place] [address]     Radiological Exams on Admission: DG Ribs Unilateral W/Chest Right  Result Date: 12/21/2020 CLINICAL DATA:  Fall, right lower rib pain EXAM: RIGHT RIBS AND CHEST - 3+ VIEW COMPARISON:  None. FINDINGS: The cardiomediastinal silhouette is normal. Lung volumes are low. There is no focal consolidation or pulmonary edema. There is no pleural effusion or pneumothorax. No rib fracture is identified at the indicated site of pain. IMPRESSION: No rib fracture identified. Electronically Signed   By: Valetta Mole M.D.   On: 12/21/2020 14:43   CT HEAD WO CONTRAST (5MM)  Result Date: 12/21/2020 CLINICAL DATA:  Multiple falls, laceration and abrasions to chin EXAM: CT HEAD WITHOUT CONTRAST CT MAXILLOFACIAL WITHOUT CONTRAST CT CERVICAL SPINE WITHOUT CONTRAST TECHNIQUE: Multidetector CT imaging of the head, cervical spine, and maxillofacial structures were performed using the standard protocol without intravenous contrast. Multiplanar CT image reconstructions of the cervical spine and maxillofacial structures were also generated. COMPARISON:  None. FINDINGS: CT HEAD FINDINGS Brain: No  evidence of acute infarction, hemorrhage, hydrocephalus, extra-axial collection or mass lesion/mass effect. Mild ex vacuo dilatation of the lateral ventricles. Periventricular and deep white matter hypodensity. Vascular: No hyperdense vessel or unexpected calcification. CT FACIAL BONES FINDINGS Skull: Normal. Negative for fracture or focal lesion. Facial bones: No displaced fractures or dislocations. Sinuses/Orbits: No acute finding. Other: Soft tissue contusion of the chin (series 3, image 70). CT CERVICAL SPINE FINDINGS Alignment: Normal. Skull base and vertebrae: No acute fracture. No primary bone lesion or focal pathologic process. Soft tissues and spinal canal: No prevertebral fluid or swelling. No visible canal hematoma. Disc levels: Focally moderate disc space height loss and osteophytosis of C5-C6, with otherwise mild disc space height loss and osteophytosis. Upper chest: Negative. Other: None. IMPRESSION: 1. No acute intracranial pathology. Small-vessel white matter disease. 2. No displaced fracture or dislocation of the facial bones. 3. Soft tissue contusion of the chin. 4. No fracture or static subluxation of the cervical spine. 5. Focally moderate disc space height loss and osteophytosis of C5-C6, with otherwise mild disc degenerative disease. Electronically Signed   By: Eddie Candle M.D.   On: 12/21/2020 15:26   CT Cervical Spine Wo Contrast  Result Date: 12/21/2020 CLINICAL DATA:  Multiple falls, laceration and abrasions to chin EXAM: CT HEAD WITHOUT CONTRAST CT MAXILLOFACIAL WITHOUT CONTRAST CT CERVICAL SPINE WITHOUT CONTRAST TECHNIQUE: Multidetector CT imaging of the head, cervical spine, and maxillofacial structures were performed using the standard protocol without intravenous contrast. Multiplanar CT image reconstructions of the cervical spine and maxillofacial structures were also generated. COMPARISON:  None. FINDINGS: CT HEAD FINDINGS Brain: No evidence of acute infarction, hemorrhage,  hydrocephalus, extra-axial collection or mass lesion/mass effect. Mild ex vacuo dilatation of the lateral ventricles. Periventricular and deep white matter hypodensity. Vascular: No hyperdense vessel or unexpected calcification. CT FACIAL BONES FINDINGS Skull: Normal. Negative for fracture or focal lesion. Facial bones: No displaced fractures or dislocations. Sinuses/Orbits: No acute finding. Other: Soft tissue contusion of the chin (series 3, image 70). CT CERVICAL SPINE FINDINGS Alignment: Normal. Skull base and vertebrae: No acute fracture. No primary bone lesion or focal pathologic process. Soft tissues and spinal canal: No prevertebral fluid or swelling. No visible canal hematoma. Disc levels: Focally moderate disc space height loss and osteophytosis of C5-C6, with otherwise mild disc space height loss and osteophytosis. Upper chest: Negative. Other: None. IMPRESSION: 1. No acute intracranial pathology. Small-vessel white matter disease. 2. No  displaced fracture or dislocation of the facial bones. 3. Soft tissue contusion of the chin. 4. No fracture or static subluxation of the cervical spine. 5. Focally moderate disc space height loss and osteophytosis of C5-C6, with otherwise mild disc degenerative disease. Electronically Signed   By: Eddie Candle M.D.   On: 12/21/2020 15:26   DG Hips Bilat W or Wo Pelvis 2 Views  Result Date: 12/21/2020 CLINICAL DATA:  Recent fall with hip pain, initial encounter EXAM: DG HIP (WITH OR WITHOUT PELVIS) 2V BILAT COMPARISON:  None. FINDINGS: Pelvic ring is intact. Degenerative changes of the hip joints are noted bilaterally. No acute fracture or dislocation is noted. No soft tissue changes are seen. IMPRESSION: No acute abnormality noted. Electronically Signed   By: [place] Catalina M.D.   On: 12/21/2020 14:43   CT Maxillofacial Wo Contrast  Result Date: 12/21/2020 CLINICAL DATA:  Multiple falls, laceration and abrasions to chin EXAM: CT HEAD WITHOUT CONTRAST CT  MAXILLOFACIAL WITHOUT CONTRAST CT CERVICAL SPINE WITHOUT CONTRAST TECHNIQUE: Multidetector CT imaging of the head, cervical spine, and maxillofacial structures were performed using the standard protocol without intravenous contrast. Multiplanar CT image reconstructions of the cervical spine and maxillofacial structures were also generated. COMPARISON:  None. FINDINGS: CT HEAD FINDINGS Brain: No evidence of acute infarction, hemorrhage, hydrocephalus, extra-axial collection or mass lesion/mass effect. Mild ex vacuo dilatation of the lateral ventricles. Periventricular and deep white matter hypodensity. Vascular: No hyperdense vessel or unexpected calcification. CT FACIAL BONES FINDINGS Skull: Normal. Negative for fracture or focal lesion. Facial bones: No displaced fractures or dislocations. Sinuses/Orbits: No acute finding. Other: Soft tissue contusion of the chin (series 3, image 70). CT CERVICAL SPINE FINDINGS Alignment: Normal. Skull base and vertebrae: No acute fracture. No primary bone lesion or focal pathologic process. Soft tissues and spinal canal: No prevertebral fluid or swelling. No visible canal hematoma. Disc levels: Focally moderate disc space height loss and osteophytosis of C5-C6, with otherwise mild disc space height loss and osteophytosis. Upper chest: Negative. Other: None. IMPRESSION: 1. No acute intracranial pathology. Small-vessel white matter disease. 2. No displaced fracture or dislocation of the facial bones. 3. Soft tissue contusion of the chin. 4. No fracture or static subluxation of the cervical spine. 5. Focally moderate disc space height loss and osteophytosis of C5-C6, with otherwise mild disc degenerative disease. Electronically Signed   By: Eddie Candle M.D.   On: 12/21/2020 15:26    EKG: Independently reviewed. Showed Sinus Rhythm with a rate of 77; NO appreciable ST elevation on my interpretation.   Assessment/Plan Active Problems:   Syncope and collapse  Lightheaded and  dizziness with near syncope and collapse -Admitted to observation telemetry -Patient states that he is fallen 3 times this week and became lightheaded and dizzy this morning when he was walking from the car to the dentist office when he fell -He was given a 1 L normal saline bolus and then placed on maintenance IV fluid_0  MLS -Check cardiac troponins x2 and echocardiogram -EKG showed NSR; Continue to Monitor on Telemetry  -Urinalysis was unremarkable -Check UDS -Check TSH -Head imaging was relatively unremarkable but if necessary will obtain MRI -Continue with IV fluid hydration overnight and hold his antihypertensives -Check orthostatic vital signs -Obtain PT/OT to further evaluation and treat  Diabetes Mellitus Type 2 with Neuropathy -Platter -Place on Sensitive Novolog SSI AC/HS -Continue to Monitor CBGs; Blood Sugar was 146 on admission  Hyponatremia -Mild. Na+ was 132 -Given a 1 Liter NS Bolus -Started  on Maintenance IVF with NS at 75 mL/hr -Continue to Monitor and Trend; Repeat CMP in the AM   HLD -C/w Statin  HTN -Hold Antihypertensives given Fall -Check Orthostatic VS -Last BP was 136/66  LE Scrapes from Fall -WOC  DVT prophylaxis: Enoxaparin 40 mg sq Code Status: Partial CODE; NO CPR and No Intubation but ok with Medications  Family Communication: No family present at bedside  Disposition Plan: Pending PT/OT  Consults called: None Admission status: Obs Telemetry   Severity of Illness: The appropriate patient status for this patient is OBSERVATION. Observation status is judged to be reasonable and necessary in order to provide the required intensity of service to ensure the patient's safety. The patient's presenting symptoms, physical exam findings, and initial radiographic and laboratory data in the context of their medical condition is felt to place them at decreased risk for further clinical deterioration. Furthermore, it is anticipated that the  patient will be medically stable for discharge from the hospital within 2 midnights of admission. The following factors support the patient status of observation.   " The patient's presenting symptoms include Fall and Lightheadedness and Dizziness. " The physical exam findings include Multiple abrasions. " The initial radiographic and laboratory data are relatively re-assuring  Kerney Elbe, D.O. [facility] PAGER is on [place]  If 7PM-7AM, please contact night-coverage [email or web address]  12/21/2020, 5:52 PM

## 2020-12-21 NOTE — ED Provider Notes (Addendum)
[place] DEPT Provider Note   CSN: [id number] Arrival date & time: 12/21/20  1210     History Chief Complaint  Patient presents with   Fall   Loss of Consciousness    Logan Hobbs is a 86 y.o. male.  86 y.o male with past medical history of diabetes, malignant neoplasm of the prostate presents to the ED with a chief complaint of syncope while at the dentist office.  Patient reports this is the third fall in the past week.  Today's fall occurred as he was exiting his vehicle and ambulating to have his dentist appointment.  He reports not losing consciousness.  Earlier this week, he had another fall when he reached for another object, reports he did strike his head on the first fall and has felt "foggy head "for the past week.  He has been taking Tylenol for the symptoms without much improvement.  The second fall consist of him reaching for his wife's wheelchair, slightly tripping on it and falling again striking his lower.  On today's visit, he reports pain along the lower lip, bilateral legs with abrasions noted.  Along with his lumbar spine with there is an obvious bruise noted.  In addition, he reports a headache, this has been ongoing since the first fall.  There has also been some changes in his vision, which he does not elaborate on.  He denies any loss of consciousness, currently on no blood thinners, no motor weakness to upper or lower extremities.  No chest pain or shortness of breath.     The history is provided by the patient and medical records.  Fall This is a new problem. The current episode started 1 to 2 hours ago. The problem occurs every several days. The problem has not changed since onset.Associated symptoms include headaches. Pertinent negatives include no chest pain and no abdominal pain. The symptoms are aggravated by standing. The symptoms are relieved by rest. He has tried nothing for the symptoms.      Past Medical History:   Diagnosis Date   Diabetes mellitus without complication 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   Cataract 09/18/2020   Constipation 09/18/2020   Encounter for fitting and adjustment of hearing aid 09/18/2020   Impacted cerumen, bilateral 09/18/2020   Need for prophylactic vaccination and inoculation against influenza 09/18/2020   Obesity 09/18/2020   Rash and other nonspecific skin eruption 09/18/2020   Ringing in ears 09/18/2020   Sensorineural hearing loss, bilateral 09/18/2020   Type 2 diabetes mellitus without complications ([place]) 17/00/1749   Trochanteric bursitis of right hip 05/28/2019   Cancer ([place]) 05/16/2018   Hearing loss 05/16/2018   Hyperlipidemia 05/16/2018   Onychomycosis 05/16/2018   Tinea cruris 05/16/2018   Vertigo 05/16/2018   Pre-operative cardiovascular examination 05/16/2018   Essential hypertension 05/16/2018   Diabetes mellitus with coincident hypertension ([place]) 03/31/2017   Blood pressure alteration 12/15/2016   Benign localized prostatic hyperplasia without lower urinary tract symptoms (LUTS) 09/13/2016   Male erectile dysfunction, unspecified 09/13/2016   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/04/2016   Gait abnormality 03/15/2015   Lumbar degenerative disc disease 03/15/2015   Neuropathy 06/20/2014   Vitamin D insufficiency 05/27/2013   Osteoarthritis 05/17/2013   Diabetes mellitus with neuropathy ([place]) 02/14/2013   Senile nuclear sclerosis 01/30/2013   Anxiety 12/13/2012   Colon polyp 02/13/2012   Allergic rhinitis 02/02/2011   Malignant neoplasm of  prostate ([place]) 10/26/2010    Past Surgical History:  Procedure Laterality Date   CATARACT EXTRACTION     PROSTATE SURGERY  2009   TONSILLECTOMY     WISDOM TOOTH EXTRACTION         No family history on file.  Social History   Tobacco Use   Smoking status: Never   Smokeless tobacco: Never  Substance Use Topics   Alcohol use:  Yes    Comment: beer occs.    Home Medications Prior to Admission medications   Medication Sig Start Date End Date Taking? Authorizing Provider  albuterol (VENTOLIN HFA) 108 (90 Base) MCG/ACT inhaler Inhale into the lungs. 11/24/09   [provider]  Blood Glucose Monitoring Suppl (ONE TOUCH ULTRA 2) w/Device KIT 3 (three) times daily as needed. 09/30/20   [provider]  cycloSPORINE (RESTASIS) 0.05 % ophthalmic emulsion Place 1 drop into both eyes 2 (two) times daily.    [provider]  escitalopram (LEXAPRO) 5 MG tablet Take 5 mg by mouth daily. 08/26/20   [provider]  Lancet Devices (BAYER MICROLET 2 LANCING [facility]) MISC Check blood sugar once daily 07/31/15   [provider]  losartan (COZAAR) 25 MG tablet Take 25 mg by mouth daily.  10/10/17   [provider]  ONETOUCH DELICA LANCETS 82N MISC CHECK BLOOD SUGAR ONCE DAILY 12/09/16   [provider]  [facility] ULTRA test strip USE TO CHECK BLOOD GLUCOSE ONCE DAILY 10/02/20   [provider]  rosuvastatin (CRESTOR) 5 MG tablet Take 5 mg by mouth daily at 6 PM.  10/10/17   [provider]  sildenafil (VIAGRA) 100 MG tablet Take by mouth. 11/29/10   [provider]  sitaGLIPtin-metformin (JANUMET) 50-1000 MG tablet TAKE 1 TABLET TWICE DAILY  WITH MEALS 10/10/17   [provider]    Allergies    Latex, Ciprofloxacin, Other, Seasonal ic [cholestatin], Solifenacin succinate, and Tamsulosin hcl  Review of Systems   Review of Systems  Constitutional:  Negative for chills and fever.  Cardiovascular:  Negative for chest pain.  Gastrointestinal:  Negative for abdominal pain, diarrhea, nausea and vomiting.  Genitourinary:  Negative for flank pain.  Skin:  Positive for color change.  Neurological:  Positive for dizziness, light-headedness and headaches.  All other systems reviewed and are negative.  Physical Exam Updated Vital Signs BP 136/66   Pulse  (!) 57   Temp 98.7 F (37.1 C) (Oral)   Resp 16   Ht _0  (1.778 m)   Wt 88 kg   SpO2 97%   BMI 27.84 kg/m   Physical Exam Vitals and nursing note reviewed.  Constitutional:      Appearance: Normal appearance.  HENT:     Head: Normocephalic.     Comments: Bruising noted to the right lower lip with bleeding controlled at this time.    Mouth/Throat:     Mouth: Mucous membranes are moist.  Eyes:     Pupils: Pupils are equal, round, and reactive to light.     Comments: Pupils are equal and reactive.  Neck:     Comments: Full ROM of his neck.  Midline tenderness. Cardiovascular:     Rate and Rhythm: Normal rate.  Pulmonary:     Effort: Pulmonary effort is normal.     Breath sounds: No wheezing.     Comments: No absent breath sounds noted. Abdominal:     General: Abdomen is flat.     Tenderness: There is no  abdominal tenderness.     Comments: Abdomen is soft, nontender to palpation.  No visible hematoma.  Musculoskeletal:     Cervical back: Normal range of motion and neck supple.       Back:  Skin:    General: Skin is warm and dry.     Findings: Abrasion, bruising, erythema and signs of injury present.       Neurological:     Mental Status: He is alert and oriented to person, place, and time.     Comments: Moves all upper and lower extremities without any deficit.    ED Results / Procedures / Treatments   Labs (all labs ordered are listed, but only abnormal results are displayed) Labs Reviewed  BASIC METABOLIC PANEL - Abnormal; Notable for the following components:      Result Value   Sodium 132 (*)    Chloride 95 (*)    Glucose, Bld 146 (*)    All other components within normal limits  CBC - Abnormal; Notable for the following components:   RBC 4.16 (*)    All other components within normal limits  URINALYSIS, ROUTINE W REFLEX MICROSCOPIC - Abnormal; Notable for the following components:   Glucose, UA 50 (*)    All other components within normal limits   RESP PANEL BY RT-PCR (FLU A&B, COVID) ARPGX2  HEPATIC FUNCTION PANEL  RAPID URINE DRUG SCREEN, HOSP PERFORMED  CBG MONITORING, ED  TROPONIN I (HIGH SENSITIVITY)    EKG EKG Interpretation  Date/Time:  Monday December 21 2020 14:31:43 EDT Ventricular Rate:  77 PR Interval:  134 QRS Duration: 108 QT Interval:  397 QTC Calculation: 450 R Axis:   -51 Text Interpretation: Sinus rhythm Left anterior fascicular block Abnormal R-wave progression, early transition No old tracing to compare Confirmed by Aletta Edouard [phone]) on 12/21/2020 3:24:13 PM  Radiology DG Ribs Unilateral W/Chest Right  Result Date: 12/21/2020 CLINICAL DATA:  Fall, right lower rib pain EXAM: RIGHT RIBS AND CHEST - 3+ VIEW COMPARISON:  None. FINDINGS: The cardiomediastinal silhouette is normal. Lung volumes are low. There is no focal consolidation or pulmonary edema. There is no pleural effusion or pneumothorax. No rib fracture is identified at the indicated site of pain. IMPRESSION: No rib fracture identified. Electronically Signed   By: Valetta Mole M.D.   On: 12/21/2020 14:43   CT HEAD WO CONTRAST (5MM)  Result Date: 12/21/2020 CLINICAL DATA:  Multiple falls, laceration and abrasions to chin EXAM: CT HEAD WITHOUT CONTRAST CT MAXILLOFACIAL WITHOUT CONTRAST CT CERVICAL SPINE WITHOUT CONTRAST TECHNIQUE: Multidetector CT imaging of the head, cervical spine, and maxillofacial structures were performed using the standard protocol without intravenous contrast. Multiplanar CT image reconstructions of the cervical spine and maxillofacial structures were also generated. COMPARISON:  None. FINDINGS: CT HEAD FINDINGS Brain: No evidence of acute infarction, hemorrhage, hydrocephalus, extra-axial collection or mass lesion/mass effect. Mild ex vacuo dilatation of the lateral ventricles. Periventricular and deep white matter hypodensity. Vascular: No hyperdense vessel or unexpected calcification. CT FACIAL BONES FINDINGS Skull: Normal.  Negative for fracture or focal lesion. Facial bones: No displaced fractures or dislocations. Sinuses/Orbits: No acute finding. Other: Soft tissue contusion of the chin (series 3, image 70). CT CERVICAL SPINE FINDINGS Alignment: Normal. Skull base and vertebrae: No acute fracture. No primary bone lesion or focal pathologic process. Soft tissues and spinal canal: No prevertebral fluid or swelling. No visible canal hematoma. Disc levels: Focally moderate disc space height loss and osteophytosis of C5-C6, with otherwise mild disc space  height loss and osteophytosis. Upper chest: Negative. Other: None. IMPRESSION: 1. No acute intracranial pathology. Small-vessel white matter disease. 2. No displaced fracture or dislocation of the facial bones. 3. Soft tissue contusion of the chin. 4. No fracture or static subluxation of the cervical spine. 5. Focally moderate disc space height loss and osteophytosis of C5-C6, with otherwise mild disc degenerative disease. Electronically Signed   By: Eddie Candle M.D.   On: 12/21/2020 15:26   CT Cervical Spine Wo Contrast  Result Date: 12/21/2020 CLINICAL DATA:  Multiple falls, laceration and abrasions to chin EXAM: CT HEAD WITHOUT CONTRAST CT MAXILLOFACIAL WITHOUT CONTRAST CT CERVICAL SPINE WITHOUT CONTRAST TECHNIQUE: Multidetector CT imaging of the head, cervical spine, and maxillofacial structures were performed using the standard protocol without intravenous contrast. Multiplanar CT image reconstructions of the cervical spine and maxillofacial structures were also generated. COMPARISON:  None. FINDINGS: CT HEAD FINDINGS Brain: No evidence of acute infarction, hemorrhage, hydrocephalus, extra-axial collection or mass lesion/mass effect. Mild ex vacuo dilatation of the lateral ventricles. Periventricular and deep white matter hypodensity. Vascular: No hyperdense vessel or unexpected calcification. CT FACIAL BONES FINDINGS Skull: Normal. Negative for fracture or focal lesion. Facial  bones: No displaced fractures or dislocations. Sinuses/Orbits: No acute finding. Other: Soft tissue contusion of the chin (series 3, image 70). CT CERVICAL SPINE FINDINGS Alignment: Normal. Skull base and vertebrae: No acute fracture. No primary bone lesion or focal pathologic process. Soft tissues and spinal canal: No prevertebral fluid or swelling. No visible canal hematoma. Disc levels: Focally moderate disc space height loss and osteophytosis of C5-C6, with otherwise mild disc space height loss and osteophytosis. Upper chest: Negative. Other: None. IMPRESSION: 1. No acute intracranial pathology. Small-vessel white matter disease. 2. No displaced fracture or dislocation of the facial bones. 3. Soft tissue contusion of the chin. 4. No fracture or static subluxation of the cervical spine. 5. Focally moderate disc space height loss and osteophytosis of C5-C6, with otherwise mild disc degenerative disease. Electronically Signed   By: Eddie Candle M.D.   On: 12/21/2020 15:26   DG Hips Bilat W or Wo Pelvis 2 Views  Result Date: 12/21/2020 CLINICAL DATA:  Recent fall with hip pain, initial encounter EXAM: DG HIP (WITH OR WITHOUT PELVIS) 2V BILAT COMPARISON:  None. FINDINGS: Pelvic ring is intact. Degenerative changes of the hip joints are noted bilaterally. No acute fracture or dislocation is noted. No soft tissue changes are seen. IMPRESSION: No acute abnormality noted. Electronically Signed   By: Inez Catalina M.D.   On: 12/21/2020 14:43   CT Maxillofacial Wo Contrast  Result Date: 12/21/2020 CLINICAL DATA:  Multiple falls, laceration and abrasions to chin EXAM: CT HEAD WITHOUT CONTRAST CT MAXILLOFACIAL WITHOUT CONTRAST CT CERVICAL SPINE WITHOUT CONTRAST TECHNIQUE: Multidetector CT imaging of the head, cervical spine, and maxillofacial structures were performed using the standard protocol without intravenous contrast. Multiplanar CT image reconstructions of the cervical spine and maxillofacial structures were  also generated. COMPARISON:  None. FINDINGS: CT HEAD FINDINGS Brain: No evidence of acute infarction, hemorrhage, hydrocephalus, extra-axial collection or mass lesion/mass effect. Mild ex vacuo dilatation of the lateral ventricles. Periventricular and deep white matter hypodensity. Vascular: No hyperdense vessel or unexpected calcification. CT FACIAL BONES FINDINGS Skull: Normal. Negative for fracture or focal lesion. Facial bones: No displaced fractures or dislocations. Sinuses/Orbits: No acute finding. Other: Soft tissue contusion of the chin (series 3, image 70). CT CERVICAL SPINE FINDINGS Alignment: Normal. Skull base and vertebrae: No acute fracture. No primary bone lesion or  focal pathologic process. Soft tissues and spinal canal: No prevertebral fluid or swelling. No visible canal hematoma. Disc levels: Focally moderate disc space height loss and osteophytosis of C5-C6, with otherwise mild disc space height loss and osteophytosis. Upper chest: Negative. Other: None. IMPRESSION: 1. No acute intracranial pathology. Small-vessel white matter disease. 2. No displaced fracture or dislocation of the facial bones. 3. Soft tissue contusion of the chin. 4. No fracture or static subluxation of the cervical spine. 5. Focally moderate disc space height loss and osteophytosis of C5-C6, with otherwise mild disc degenerative disease. Electronically Signed   By: Eddie Candle M.D.   On: 12/21/2020 15:26    Procedures Procedures   Medications Ordered in ED Medications  acetaminophen (TYLENOL) tablet 650 mg (650 mg Oral Given 12/21/20 1429)  sodium chloride 0.9 % bolus 1,000 mL (1,000 mLs Intravenous New Bag/Given 12/21/20 1430)    ED Course  I have reviewed the triage vital signs and the nursing notes.  Pertinent labs & imaging results that were available during my care of the patient were reviewed by me and considered in my medical decision making (see chart for details).  Clinical Course as of 12/21/20 1544   Mon Dec 21, 2020  1543 Ketones, ur: NEGATIVE [JS]    Clinical Course User Index [JS] Janeece Fitting, PA-C   MDM Rules/Calculators/A&P   Patient with a past medical history presents to the ED EMS status post syncopal episode at his dentist office this morning.  Patient reports this is the third fall in the last 3 days, seems that his falls were unprovoked.  There was no mechanical aspect noted.  Reports that after his first fall he did strike his head did not lose consciousness but has felt "foggy in the head ", will obtain CT scan to rule out any intracranial pathology.  He is currently on no blood thinners, during my evaluation his pupils are equal and reactive, strength is equal and symmetric bilateral upper extremities and bilateral lower extremities.  He has been ambulatory in the ED with assistance.  Vitals are otherwise within normal limits there is notable abrasions to bilateral lower extremities, along with to his right lower lip.  Last tetanus immunization less than 10 years ago according to his records in October 2012.  Interpretation of his labs reveal a CBC with no leukocytosis, hemoglobin is within normal limits.  CMP slight decrease in sodium, however creatinine levels within normal limits.  Hepatic panel is normal.  UA without any signs of infection.  Trays of his ribs did not show any rib fracture.  Bilateral hip x-rays are negative.  CT head, cervical spine, superficial are currently pending.  Orthostatic vitals have not been performed, however patient did receive an additional bolus today.  Patient is pending CT findings along with likely need admission for syncope and collapse.  3:37 PM CT imaging: 1. No acute intracranial pathology. Small-vessel white matter  disease.  2. No displaced fracture or dislocation of the facial bones.  3. Soft tissue contusion of the chin.  4. No fracture or static subluxation of the cervical spine.  5. Focally moderate disc space height loss and  osteophytosis of  C5-C6, with otherwise mild disc degenerative disease.      Will place call for syncope admission at this time.   3:44 PM spoke to hospitalist, Dr. Wynelle Fanny who will admit patient for further management.  Portions of this note were generated with [employer]. Dictation errors may occur despite best attempts  at proofreading.  Final Clinical Impression(s) / ED Diagnoses Final diagnoses:  Syncope and collapse    Rx / DC Orders ED Discharge Orders     None        Janeece Fitting, PA-C 12/21/20 1517    Janeece Fitting, PA-C 12/21/20 1544    Dorie Rank, MD 12/22/20 1436

## 2020-12-21 NOTE — ED Provider Notes (Signed)
  Physical Exam  BP 136/66   Pulse (!) 57   Temp 98.7 F (37.1 C) (Oral)   Resp 16   Ht '5\' 10"'$  (1.778 m)   Wt 88 kg   SpO2 97%   BMI 27.84 kg/m   Physical Exam  ED Course/Procedures   Clinical Course as of 12/21/20 1552  Mon Dec 21, 2020  1543 Ketones, ur: NEGATIVE [JS]    Clinical Course User Index [JS] Janeece Fitting, PA-C    Procedures  MDM    Patient assumed from preceding ED provider J. Soto, PA-C at time of shift change.  Please see her associated note for further insight the patient's ED course.  In brief, patient is an 86 year old male with history of prostate cancer and diabetes who presents with multiple episodes of near syncope and 3 falls this week with syncopal episode and collapse today while walking into dental appointment.  Does endorse head trauma after first fall without loss of consciousness blurry, double vision or nausea.     CBC unremarkable, BMP with very mild hyponatremia of 132.  Hepatic function panel is normal.  UA is unremarkable.  Plain films of the ribs+chest and hips with pelvis are unremarkable.  At time of shift change patient is waiting for results of CT imaging.  Will likely require admission for further syncopal work-up.  EKG here reassuring, sinus rhythm left anterior fascicular block but no ischemic changes or overt dysrhythmia.  CT imaging reassuring.  Intake ED provider J.  Elmore Guise, was still in the emergency department with hospitalist called for admission, and she spoke with admitting provider who was agreeable to admitting this patient to his service.    Aura Dials 12/21/20 1553    Hayden Rasmussen, MD 12/22/20 [phone]

## 2020-12-21 NOTE — ED Notes (Signed)
Patient ambulatory to restroom with 1 person assist

## 2020-12-21 NOTE — ED Notes (Addendum)
Patients gown was changed and new mask was provided. Patient states he takes miralax for chronic constipation. Patient also was concerned about his blood sugar. Patient is aware he is being admitted and is now eating dinner.

## 2020-12-21 NOTE — ED Notes (Signed)
Patient was able to walk to the bathroom with assistance of staff and using a walker. Patient is unstable on his feet and will need support when ambulating.

## 2020-12-22 ENCOUNTER — Other Ambulatory Visit: Payer: Self-pay | Admitting: Cardiology

## 2020-12-22 ENCOUNTER — Observation Stay (HOSPITAL_BASED_OUTPATIENT_CLINIC_OR_DEPARTMENT_OTHER): Payer: Medicare Other

## 2020-12-22 DIAGNOSIS — R55 Syncope and collapse: Secondary | ICD-10-CM

## 2020-12-22 DIAGNOSIS — D539 Nutritional anemia, unspecified: Secondary | ICD-10-CM

## 2020-12-22 DIAGNOSIS — E871 Hypo-osmolality and hyponatremia: Secondary | ICD-10-CM | POA: Diagnosis not present

## 2020-12-22 LAB — CBC WITH DIFFERENTIAL/PLATELET
Abs Immature Granulocytes: 0.06 10*3/uL (ref 0.00–0.07)
Basophils Absolute: 0 10*3/uL (ref 0.0–0.1)
Basophils Relative: 1 %
Eosinophils Absolute: 0.2 10*3/uL (ref 0.0–0.5)
Eosinophils Relative: 3 %
HCT: 37 % — ABNORMAL LOW (ref 39.0–52.0)
Hemoglobin: 12.9 g/dL — ABNORMAL LOW (ref 13.0–17.0)
Immature Granulocytes: 1 %
Lymphocytes Relative: 24 %
Lymphs Abs: 1.5 10*3/uL (ref 0.7–4.0)
MCH: 35.2 pg — ABNORMAL HIGH (ref 26.0–34.0)
MCHC: 34.9 g/dL (ref 30.0–36.0)
MCV: 101.1 fL — ABNORMAL HIGH (ref 80.0–100.0)
Monocytes Absolute: 1 10*3/uL (ref 0.1–1.0)
Monocytes Relative: 16 %
Neutro Abs: 3.5 10*3/uL (ref 1.7–7.7)
Neutrophils Relative %: 55 %
Platelets: 238 10*3/uL (ref 150–400)
RBC: 3.66 MIL/uL — ABNORMAL LOW (ref 4.22–5.81)
RDW: 13.7 % (ref 11.5–15.5)
WBC: 6.3 10*3/uL (ref 4.0–10.5)
nRBC: 0 % (ref 0.0–0.2)

## 2020-12-22 LAB — GLUCOSE, CAPILLARY: Glucose-Capillary: 167 mg/dL — ABNORMAL HIGH (ref 70–99)

## 2020-12-22 LAB — COMPREHENSIVE METABOLIC PANEL
ALT: 17 U/L (ref 0–44)
AST: 18 U/L (ref 15–41)
Albumin: 3.5 g/dL (ref 3.5–5.0)
Alkaline Phosphatase: 40 U/L (ref 38–126)
Anion gap: 7 (ref 5–15)
BUN: 12 mg/dL (ref 8–23)
CO2: 25 mmol/L (ref 22–32)
Calcium: 8.8 mg/dL — ABNORMAL LOW (ref 8.9–10.3)
Chloride: 105 mmol/L (ref 98–111)
Creatinine, Ser: 0.74 mg/dL (ref 0.61–1.24)
GFR, Estimated: >60 mL/min (ref >60)
Glucose, Bld: 134 mg/dL — ABNORMAL HIGH (ref 70–99)
Potassium: 3.9 mmol/L (ref 3.5–5.1)
Sodium: 137 mmol/L (ref 135–145)
Total Bilirubin: 0.8 mg/dL (ref 0.3–1.2)
Total Protein: 6.6 g/dL (ref 6.5–8.1)

## 2020-12-22 LAB — PHOSPHORUS: Phosphorus: 2.7 mg/dL (ref 2.5–4.6)

## 2020-12-22 LAB — MAGNESIUM: Magnesium: 2.1 mg/dL (ref 1.7–2.4)

## 2020-12-22 LAB — HEMOGLOBIN A1C
Hgb A1c MFr Bld: 7.6 % — ABNORMAL HIGH (ref 4.8–5.6)
Hgb A1c MFr Bld: 7.8 % — ABNORMAL HIGH (ref 4.8–5.6)
Mean Plasma Glucose: 171.42 mg/dL
Mean Plasma Glucose: 177.16 mg/dL

## 2020-12-22 LAB — ECHOCARDIOGRAM COMPLETE
Area-P 1/2: 3.74 cm2
Height: 70 in
S' Lateral: 2.4 cm
Weight: 3104 oz

## 2020-12-22 LAB — CBG MONITORING, ED
Glucose-Capillary: 167 mg/dL — ABNORMAL HIGH (ref 70–99)
Glucose-Capillary: 179 mg/dL — ABNORMAL HIGH (ref 70–99)

## 2020-12-22 MED ORDER — POLYETHYLENE GLYCOL 3350 17 G PO PACK
17.0000 g | PACK | Freq: Two times a day (BID) | ORAL | Status: DC
  Administered 2020-12-22: 17 g via ORAL
  Filled 2020-12-22: qty 1

## 2020-12-22 MED ORDER — MUPIROCIN 2 % EX OINT
TOPICAL_OINTMENT | Freq: Two times a day (BID) | CUTANEOUS | Status: DC
  Administered 2020-12-22: 1 via TOPICAL
  Filled 2020-12-22: qty 22

## 2020-12-22 MED ORDER — MUPIROCIN 2 % EX OINT: TOPICAL_OINTMENT | Freq: Two times a day (BID) | CUTANEOUS | 0 refills | Status: DC

## 2020-12-22 NOTE — Evaluation (Signed)
Physical Therapy Evaluation Patient Details Name: Logan Hobbs MRN: [id number] DOB: 09-Jun-1934 Today's Date: 12/22/2020   History of Present Illness  Patient is an 86 year old male admitted after dizziness and fall while walking into dentist's office. Had another recent fall after tripping over spouse's wheelchair. PMH includes  diabetes mellitus type 2 with neuropathy, hypertension, hyperlipidemia.  Clinical Impression  Patient  presents after a fall , noted abrasions on legs and face. Patient resides at [facility] with wife. Patient independent and drives PTA. Wife requires assistance of aides 24/7.  Patient does demonstrate decreased gait stability when not using RW. Patient does have a Rw at home and it is recommended hat he use it. He has access to PT at the facility."I want to start that back up." Patient has a history of peripheral neuropathy.  Patient should progress to return home. Pt admitted with above diagnosis.  Pt currently with functional limitations due to the deficits listed below (see PT Problem List). Pt will benefit from skilled PT to increase their independence and safety with mobility to allow discharge to the venue listed below.       Follow Up Recommendations Home health PT    Equipment Recommendations  None recommended by PT    Recommendations for Other Services       Precautions / Restrictions Precautions Precautions: Fall Precaution Comments: reports 3 falls in past month Restrictions Weight Bearing Restrictions: No      Mobility  Bed Mobility Overal bed mobility: Modified Independent             General bed mobility comments: much effort to sit up, legs in air, difficult to engage core , finally able to turn and push self up.    Transfers Overall transfer level: Needs assistance Equipment used: Rolling walker (2 wheeled) Transfers: Sit to/from Stand Sit to Stand: Min guard         General transfer comment: for  safety  Ambulation/Gait Ambulation/Gait assistance: Min guard Gait Distance (Feet): 300 Feet Assistive device: Rolling walker (2 wheeled);None Gait Pattern/deviations: Step-through pattern;Decreased stride length;Shuffle Gait velocity: decr   General Gait Details: gait steady  using RW, without Rw, step length shortens and more shuffling .  Stairs            Wheelchair Mobility    Modified Rankin (Stroke Patients Only)       Balance Overall balance assessment: History of Falls;Mild deficits observed, not formally tested                                           Pertinent Vitals/Pain Pain Assessment: Faces Faces Pain Scale: Hurts little more Pain Location: head ache Pain Descriptors / Indicators: Headache;Aching Pain Intervention(s): Monitored during session;Premedicated before session    Home Living Family/patient expects to be discharged to:: Private residence Living Arrangements: Spouse/significant other Available Help at Discharge: Family Type of Home: Independent living facility ([facility])       Home Layout: One level Home Equipment: Environmental consultant - 2 wheels;Shower seat;Grab bars - tub/shower Additional Comments: spouse is in a wheelchair    Prior Function Level of Independence: Independent         Comments: have 24/7 aides to assist with spouse     Hand Dominance   Dominant Hand: Right    Extremity/Trunk Assessment   Upper Extremity Assessment Upper Extremity Assessment: Overall WFL for tasks  assessed    Lower Extremity Assessment Lower Extremity Assessment: RLE deficits/detail;LLE deficits/detail;Generalized weakness RLE Sensation: history of peripheral neuropathy LLE Sensation: history of peripheral neuropathy    Cervical / Trunk Assessment Cervical / Trunk Assessment: Normal  Communication   Communication: No difficulties  Cognition Arousal/Alertness: Awake/alert Behavior During Therapy: WFL for tasks  assessed/performed Overall Cognitive Status: Within Functional Limits for tasks assessed                                        General Comments General comments (skin integrity, edema, etc.): can benefit from standard testing    Exercises     Assessment/Plan    PT Assessment Patient needs continued PT services  PT Problem List Decreased strength;Decreased balance;Impaired sensation       PT Treatment Interventions DME instruction;Therapeutic activities;Gait training;Therapeutic exercise;Patient/family education;Functional mobility training;Balance training    PT Goals (Current goals can be found in the Care Plan section)  Acute Rehab PT Goals Patient Stated Goal: home PT Goal Formulation: With patient Time For Goal Achievement: 01/05/21 Potential to Achieve Goals: Good    Frequency Min 3X/week   Barriers to discharge        Co-evaluation               AM-PAC PT "6 Clicks" Mobility  Outcome Measure Help needed turning from your back to your side while in a flat bed without using bedrails?: None Help needed moving from lying on your back to sitting on the side of a flat bed without using bedrails?: None Help needed moving to and from a bed to a chair (including a wheelchair)?: A Little Help needed standing up from a chair using your arms (e.g., wheelchair or bedside chair)?: A Little Help needed to walk in hospital room?: A Little Help needed climbing 3-5 steps with a railing? : A Little 6 Click Score: 20    End of Session Equipment Utilized During Treatment: Gait belt Activity Tolerance: Patient tolerated treatment well Patient left: in bed;with call bell/phone within reach Nurse Communication: Mobility status PT Visit Diagnosis: Unsteadiness on feet (R26.81)    Time: [id number] PT Time Calculation (min) (ACUTE ONLY): 20 min   Charges:   PT Evaluation $PT Eval Low Complexity: [place] [facility] Acute Rehabilitation  Services Pager [phone] Office [phone]   Claretha Cooper 12/22/2020, 10:26 AM

## 2020-12-22 NOTE — Consult Note (Signed)
[facility] Nurse Consult Note: Patient receiving care in [place]. Reason for Consult: scrapes and abrasions from fall on legs and face Wound type: trauma injuries Pressure Injury POA: Yes/No/NA Measurement: Wound bed: To be provided by the bedside RN in the flowsheet section  Drainage (amount, consistency, odor)  Periwound: Dressing procedure/placement/frequency: Apply Mupirocin ointment to all abrasions on face, legs, trunk.  Cover with non-adherent dressings if possible.   Monitor the wound area(s) for worsening of condition such as: Signs/symptoms of infection,  Increase in size,  Development of or worsening of odor, Development of pain, or increased pain at the affected locations.  Notify the medical team if any of these develop.  [facility] nurse will not follow at this time.  Please re-consult the [facility] team if needed.  Val Riles, RN, MSN, CWOCN, CNS-BC, pager [phone]

## 2020-12-22 NOTE — ED Notes (Signed)
Walked into room, patient was asleep.

## 2020-12-22 NOTE — Progress Notes (Signed)
BP 137/66; HR 65; while lying.  BP 146/79; HR 68; while sitting. BP 142/91; HR 70; while standing.   Pt denies any SOB or dizziness. MD made aware

## 2020-12-22 NOTE — ED Notes (Signed)
Pt ambulatory with walker to bathroom with standby assistance.

## 2020-12-22 NOTE — Progress Notes (Signed)
Mobility Specialist - Progress Note     12/22/20 1542  Mobility  Activity Ambulated in hall  Level of Assistance Contact guard assist, steadying assist  [place] wheel walker  Distance Ambulated (ft) 400 ft  Mobility Ambulated with assistance in hallway  Mobility Response Tolerated well  Mobility performed by Mobility specialist  $Mobility charge 1 Mobility   Pt ambulated ~400 ft in hallway using RW. No complaints were made during session. Pt tolerated ambulation well and was returned to bed after session with call bell at side.   [facility] Special[facility] Acute Rehabilitation Services Phone: [phone] 12/22/20, 3:44 PM

## 2020-12-22 NOTE — ED Notes (Signed)
Wife updated that patient will be moved to 1514.   Pt provided with breakfast tray.

## 2020-12-22 NOTE — ED Notes (Signed)
Secure chat sent to inpatient nurse for Care Handoff.

## 2020-12-22 NOTE — Discharge Summary (Addendum)
Physician Discharge Summary  LIONEL WOODBERRY KWI:[id number] DOB: 26-Aug-1934 DOA: 12/21/2020  PCP: Marda Stalker, PA-C  Admit date: 12/21/2020 Discharge date: 12/22/2020  Admitted From: Home Disposition: Home with [facility] PT/OR  Recommendations for Outpatient Follow-up:  Follow up with PCP in 1-2 weeks Follow up with Cardiology as an outpatient for 30 Day Holter Please obtain CMP/CBC, Mag, PHos in one week Please follow up on the following pending results:  Home Health: YES Equipment/Devices: None  Discharge Condition: Stable CODE STATUS: FULL CODE  Diet recommendation: Heart Healthy Diabetic Diet   Brief/Interim Summary: HPI: NOCHUM FENTER is a 86 y.o. male with medical history significant for but not limited to diabetes mellitus type 2 with neuropathy, hypertension, hyperlipidemia, erectile dysfunction, as well as other comorbidities who presents with a chief complaint of a fall and dizziness.  States that he was going onto his dentist office and he got to the parking lot where he got out of his car and walked a few steps and fell.  He states that he got lightheaded and dizzy when this happened and he states that he is fallen 3 times this week.  The first time he fell was a mechanical fall he tripped over his wife's wheelchair.  The second time he fell he turned and fell a states that it was similar the second time that he fell with the lightheadedness and dizziness.  States that he has not been eating and drinking today and thinks he could be dehydrated.  He does state that he does have neuropathy which does affect his gait.  States that he did not actually lose consciousness but states that he did get lightheaded and dizzy.  Denies any chest pain or shortness of breath.  He underwent multiple imaging testing with a CT of the cervical spine, CT of the head and CT maxillofacial as well as a hip x-ray and a chest x-ray.  CT head face and cervical spine showed no acute intracranial  pathology but he did have small vessel white matter disease but there is no displaced fracture or dislocation of the facial bones and he did have a soft tissue contusion of the chin.  There is no fracture or static subluxation of the cervical spine.  Focally there is moderate disc space height loss and osteophyte elastosis of C5-C6 with other mild degenerative disease.  Chest x-ray showed no rib fractures.  And bilateral hip with pelvis view showed no acute abnormality noted.  [facility] was asked to admit this patient for suspected syncope and fall   ED Course: In the ED had basic blood work done and was given 1 L normal saline bolus.  COVID testing was negative.  He had multiple imaging modalities as above.  He also had an EKG done.  **Interim History Underwent syncopal work-up and had echocardiogram, troponins and orthostatics as well as PT OT evaluation.  After fluid rehydration his orthostatics are improved and he is not dizzy.  Complained of a mild headache from hitting his head.  Echocardiogram was done and read as normal.  Spoke with cardiology who will arrange a 30-day Holter monitor for the patient.  He is medically stable to be discharged at this time and he will be discharged with TED hose as he feels that he stood up too quickly and will have PT/OT at his Facility   Discharge Diagnoses:  Active Problems:   Syncope and collapse  Lightheaded and dizziness with near syncope and collapse -Admitted to observation telemetry -Patient  states that he is fallen 3 times this week and became lightheaded and dizzy this morning when he was walking from the car to the dentist office when he fell -He was given a 1 L normal saline bolus and then placed on maintenance IV fluid_0  MLS -Check cardiac troponins x2 and they were Flat at 6 -Echocardiogram done and showed Normal EF and No Diastolic Dysfunction -EKG showed NSR; Continue to Monitor on Telemetry and will get a 30 Day Holter Monitor  -Urinalysis was  unremarkable -Check UDS and was normal -Check TSH and was normal at 0.647 -Head imaging was relatively unremarkable but if necessary will obtain MRI; Currently not having any focal deficits and only complaining of a mild headache so will hold off MRI as an inpatient; Discussed with him if his headache persists and if he gets any focal deficits to come back and obtain MRI  -Continue with IV fluid hydration overnight and hold his antihypertensives -Check orthostatic vital signs and did not drop and was not symptomatic  -Obtain PT/OT to further evaluation and treat and recommending Home Health   Diabetes Mellitus Type 2 with Neuropathy -[place] but will resume at D/C  -Place on Sensitive Novolog SSI AC/HS -HbA1c was 7.8 -Continue to Monitor CBGs; Blood Sugar was 146 on admission -CBG's ranging from 167-201   Hyponatremia -Mild. Na+ was 132 and improved to 137 -Given a 1 Liter NS Bolus -Started on Maintenance IVF with NS at 75 mL/hr -Continue to Monitor and Trend; Repeat CMP in the AM   Macrocytic Anemia -Dilutional Drop from Admission as Hgb/Hct went from 14.1/40.8 -> 12.9/37.0 with an MCV of 101.1 -No S/Sx of Bleeding but does have cuts and abrasions from fall -Check Anemia Panel in the outpatient setting -Repeat CBC within 1 week   HLD -C/w Statin   HTN -Held Antihypertensives given Fall -Check Orthostatic VS and he did not drop -Last BP was 142/91   LE Scrapes from Fall [facility]   Discharge Instructions Discharge Instructions     Call MD for:  difficulty breathing, headache or visual disturbances   Complete by: As directed    Call MD for:  extreme fatigue   Complete by: As directed    Call MD for:  hives   Complete by: As directed    Call MD for:  persistant dizziness or light-headedness   Complete by: As directed    Call MD for:  persistant nausea and vomiting   Complete by: As directed    Call MD for:  redness, tenderness, or signs of infection (pain,  swelling, redness, odor or green/yellow discharge around incision site)   Complete by: As directed    Call MD for:  severe uncontrolled pain   Complete by: As directed    Call MD for:  temperature >100.4   Complete by: As directed    Diet - low sodium heart healthy   Complete by: As directed    Discharge instructions   Complete by: As directed    You were cared for by a hospitalist during your hospital stay. If you have any questions about your discharge medications or the care you received while you were in the hospital after you are discharged, you can call the unit and ask to speak with the hospitalist on call if the hospitalist that took care of you is not available. Once you are discharged, your primary care physician will handle any further medical issues. Please note that NO REFILLS for any discharge medications  will be authorized once you are discharged, as it is imperative that you return to your primary care physician (or establish a relationship with a primary care physician if you do not have one) for your aftercare needs so that they can reassess your need for medications and monitor your lab values.  Follow up with PCP and Cardiology for 30 Day Holter Monitor. Take all medications as prescribed. If symptoms change or worsen please return to the ED for evaluation   Discharge wound care:   Complete by: As directed    Apply Mupirocin ointment to all abrasions on face, legs, trunk.  Cover with non-adherent dressings if possible.   Increase activity slowly   Complete by: As directed       Allergies as of 12/22/2020       Reactions   Latex Shortness Of Breath   Skin itching and breathing problems   Ciprofloxacin Other (See Comments)   constipation   Seasonal Ic [cholestatin] Other (See Comments)   Unknown reaction   Solifenacin Succinate Other (See Comments)   constipation   Tamsulosin Hcl    Weakness, vertigo, and nausea        Medication List     TAKE these  medications    acetaminophen 325 MG tablet Commonly known as: TYLENOL Take 325-650 mg by mouth every 6 (six) hours as needed for headache (pain).   albuterol 108 (90 Base) MCG/ACT inhaler Commonly known as: VENTOLIN HFA Inhale 2 puffs into the lungs every 6 (six) hours as needed for wheezing or shortness of breath.   Bayer Microlet 2 Lancing Devic Misc Check blood sugar once daily   cycloSPORINE 0.05 % ophthalmic emulsion Commonly known as: RESTASIS Place 1 drop into both eyes 2 (two) times daily.   escitalopram 5 MG tablet Commonly known as: LEXAPRO Take 5 mg by mouth every morning.   losartan 25 MG tablet Commonly known as: COZAAR Take 25 mg by mouth every morning.   mupirocin ointment 2 % Commonly known as: BACTROBAN Apply topically 2 (two) times daily.   ONE TOUCH ULTRA 2 w/Device Kit 3 (three) times daily as needed.   OneTouch Delica Lancets 40N Misc CHECK BLOOD SUGAR ONCE DAILY   OneTouch Ultra test strip Generic drug: glucose blood USE TO CHECK BLOOD GLUCOSE ONCE DAILY   rosuvastatin 10 MG tablet Commonly known as: CRESTOR Take 10 mg by mouth every morning. What changed: Another medication with the same name was removed. Continue taking this medication, and follow the directions you see here.   sitaGLIPtin-metformin 50-1000 MG tablet Commonly known as: JANUMET Take 1 tablet by mouth 2 (two) times daily.               Discharge Care Instructions  (From admission, onward)           Start     Ordered   12/22/20 0000  Discharge wound care:       Comments: Apply Mupirocin ointment to all abrasions on face, legs, trunk.  Cover with non-adherent dressings if possible.   12/22/20 1632            Follow-up Information     Revankar, Reita Cliche, MD Follow up.   Specialty: Cardiology Why: the office will call you with follow up date and time - if you have not heard by Friday call the office Contact information: [id number] [place]  [phone]  Allergies  Allergen Reactions   Latex Shortness Of Breath    Skin itching and breathing problems   Ciprofloxacin Other (See Comments)    constipation   Seasonal Ic [Cholestatin] Other (See Comments)    Unknown reaction   Solifenacin Succinate Other (See Comments)    constipation   Tamsulosin Hcl     Weakness, vertigo, and nausea   Consultations: None  Procedures/Studies: DG Ribs Unilateral W/Chest Right  Result Date: 12/21/2020 CLINICAL DATA:  Fall, right lower rib pain EXAM: RIGHT RIBS AND CHEST - 3+ VIEW COMPARISON:  None. FINDINGS: The cardiomediastinal silhouette is normal. Lung volumes are low. There is no focal consolidation or pulmonary edema. There is no pleural effusion or pneumothorax. No rib fracture is identified at the indicated site of pain. IMPRESSION: No rib fracture identified. Electronically Signed   By: Valetta Mole M.D.   On: 12/21/2020 14:43   CT HEAD WO CONTRAST (5MM)  Result Date: 12/21/2020 CLINICAL DATA:  Multiple falls, laceration and abrasions to chin EXAM: CT HEAD WITHOUT CONTRAST CT MAXILLOFACIAL WITHOUT CONTRAST CT CERVICAL SPINE WITHOUT CONTRAST TECHNIQUE: Multidetector CT imaging of the head, cervical spine, and maxillofacial structures were performed using the standard protocol without intravenous contrast. Multiplanar CT image reconstructions of the cervical spine and maxillofacial structures were also generated. COMPARISON:  None. FINDINGS: CT HEAD FINDINGS Brain: No evidence of acute infarction, hemorrhage, hydrocephalus, extra-axial collection or mass lesion/mass effect. Mild ex vacuo dilatation of the lateral ventricles. Periventricular and deep white matter hypodensity. Vascular: No hyperdense vessel or unexpected calcification. CT FACIAL BONES FINDINGS Skull: Normal. Negative for fracture or focal lesion. Facial bones: No displaced fractures or dislocations. Sinuses/Orbits: No acute finding. Other: Soft  tissue contusion of the chin (series 3, image 70). CT CERVICAL SPINE FINDINGS Alignment: Normal. Skull base and vertebrae: No acute fracture. No primary bone lesion or focal pathologic process. Soft tissues and spinal canal: No prevertebral fluid or swelling. No visible canal hematoma. Disc levels: Focally moderate disc space height loss and osteophytosis of C5-C6, with otherwise mild disc space height loss and osteophytosis. Upper chest: Negative. Other: None. IMPRESSION: 1. No acute intracranial pathology. Small-vessel white matter disease. 2. No displaced fracture or dislocation of the facial bones. 3. Soft tissue contusion of the chin. 4. No fracture or static subluxation of the cervical spine. 5. Focally moderate disc space height loss and osteophytosis of C5-C6, with otherwise mild disc degenerative disease. Electronically Signed   By: Eddie Candle M.D.   On: 12/21/2020 15:26   CT Cervical Spine Wo Contrast  Result Date: 12/21/2020 CLINICAL DATA:  Multiple falls, laceration and abrasions to chin EXAM: CT HEAD WITHOUT CONTRAST CT MAXILLOFACIAL WITHOUT CONTRAST CT CERVICAL SPINE WITHOUT CONTRAST TECHNIQUE: Multidetector CT imaging of the head, cervical spine, and maxillofacial structures were performed using the standard protocol without intravenous contrast. Multiplanar CT image reconstructions of the cervical spine and maxillofacial structures were also generated. COMPARISON:  None. FINDINGS: CT HEAD FINDINGS Brain: No evidence of acute infarction, hemorrhage, hydrocephalus, extra-axial collection or mass lesion/mass effect. Mild ex vacuo dilatation of the lateral ventricles. Periventricular and deep white matter hypodensity. Vascular: No hyperdense vessel or unexpected calcification. CT FACIAL BONES FINDINGS Skull: Normal. Negative for fracture or focal lesion. Facial bones: No displaced fractures or dislocations. Sinuses/Orbits: No acute finding. Other: Soft tissue contusion of the chin (series 3, image  70). CT CERVICAL SPINE FINDINGS Alignment: Normal. Skull base and vertebrae: No acute fracture. No primary bone lesion or focal pathologic process. Soft tissues and  spinal canal: No prevertebral fluid or swelling. No visible canal hematoma. Disc levels: Focally moderate disc space height loss and osteophytosis of C5-C6, with otherwise mild disc space height loss and osteophytosis. Upper chest: Negative. Other: None. IMPRESSION: 1. No acute intracranial pathology. Small-vessel white matter disease. 2. No displaced fracture or dislocation of the facial bones. 3. Soft tissue contusion of the chin. 4. No fracture or static subluxation of the cervical spine. 5. Focally moderate disc space height loss and osteophytosis of C5-C6, with otherwise mild disc degenerative disease. Electronically Signed   By: Eddie Candle M.D.   On: 12/21/2020 15:26   ECHOCARDIOGRAM COMPLETE  Result Date: 12/22/2020    ECHOCARDIOGRAM REPORT   Patient Name:   Logan Hobbs Date of Exam: 12/22/2020 Medical Rec #:  [id number]        Height:       70.0 in Accession #:    [id number]       Weight:       194.0 lb Date of Birth:  1934-09-25        BSA:          2.060 m Patient Age:    41 years         BP:           145/94 mmHg Patient Gender: M                HR:           73 bpm. Exam Location:  Inpatient Procedure: 2D Echo, Cardiac Doppler and Color Doppler Indications:    R55 Syncope  History:        Patient has prior history of Echocardiogram examinations, most                 recent 05/21/2018. Risk Factors:Diabetes.  Sonographer:    Bernadene Person RDCS Referring Phys: [id number] Georgina Quint LATIF [place]  1. Left ventricular ejection fraction, by estimation, is 60 to 65%. The left ventricle has normal function. The left ventricle has no regional wall motion abnormalities. Left ventricular diastolic parameters were normal.  2. Right ventricular systolic function is normal. The right ventricular size is normal.  3. Left atrial size was mildly  dilated.  4. The mitral valve is normal in structure. No evidence of mitral valve regurgitation. No evidence of mitral stenosis.  5. The aortic valve is normal in structure. Aortic valve regurgitation is not visualized. No aortic stenosis is present.  6. The inferior vena cava is normal in size with greater than 50% respiratory variability, suggesting right atrial pressure of 3 mmHg. FINDINGS  Left Ventricle: Left ventricular ejection fraction, by estimation, is 60 to 65%. The left ventricle has normal function. The left ventricle has no regional wall motion abnormalities. The left ventricular internal cavity size was normal in size. There is  no left ventricular hypertrophy. Left ventricular diastolic parameters were normal. Normal left ventricular filling pressure. Right Ventricle: The right ventricular size is normal. No increase in right ventricular wall thickness. Right ventricular systolic function is normal. Left Atrium: Left atrial size was mildly dilated. Right Atrium: Right atrial size was normal in size. Pericardium: There is no evidence of pericardial effusion. Mitral Valve: The mitral valve is normal in structure. No evidence of mitral valve regurgitation. No evidence of mitral valve stenosis. Tricuspid Valve: The tricuspid valve is normal in structure. Tricuspid valve regurgitation is trivial. No evidence of tricuspid stenosis. Aortic Valve: The aortic valve is normal in structure. Aortic valve regurgitation  is not visualized. No aortic stenosis is present. Pulmonic Valve: The pulmonic valve was normal in structure. Pulmonic valve regurgitation is not visualized. No evidence of pulmonic stenosis. Aorta: The aortic root is normal in size and structure. Venous: The inferior vena cava is normal in size with greater than 50% respiratory variability, suggesting right atrial pressure of 3 mmHg. IAS/Shunts: No atrial level shunt detected by color flow Doppler.  LEFT VENTRICLE PLAX 2D LVIDd:         4.20 cm   Diastology LVIDs:         2.40 cm  LV e' medial:    7.05 cm/s LV PW:         1.00 cm  LV E/e' medial:  10.2 LV IVS:        0.90 cm  LV e' lateral:   9.57 cm/s LVOT diam:     2.10 cm  LV E/e' lateral: 7.5 LV SV:         87 LV SV Index:   42 LVOT Area:     3.46 cm  RIGHT VENTRICLE RV S prime:     12.20 cm/s TAPSE (M-mode): 2.0 cm LEFT ATRIUM             Index       RIGHT ATRIUM           Index LA diam:        4.10 cm 1.99 cm/m  RA Area:     11.10 cm LA Vol (A2C):   58.0 ml 28.15 ml/m RA Volume:   22.70 ml  11.02 ml/m LA Vol (A4C):   56.2 ml 27.28 ml/m LA Biplane Vol: 57.5 ml 27.91 ml/m  AORTIC VALVE LVOT Vmax:   121.00 cm/s LVOT Vmean:  79.900 cm/s LVOT VTI:    0.250 m  AORTA Ao Root diam: 3.60 cm Ao Asc diam:  3.60 cm MITRAL VALVE               TRICUSPID VALVE MV Area (PHT): 3.74 cm    TR Peak grad:   25.6 mmHg MV Decel Time: 203 msec    TR Vmax:        253.00 cm/s MV E velocity: 72.00 cm/s MV A velocity: 54.40 cm/s  SHUNTS MV E/A ratio:  1.32        Systemic VTI:  0.25 m                            Systemic Diam: 2.10 cm Dani Gobble Croitoru MD Electronically signed by Sanda Klein MD Signature Date/Time: 12/22/2020/11:28:51 AM    Final    DG Hips Bilat W or Wo Pelvis 2 Views  Result Date: 12/21/2020 CLINICAL DATA:  Recent fall with hip pain, initial encounter EXAM: DG HIP (WITH OR WITHOUT PELVIS) 2V BILAT COMPARISON:  None. FINDINGS: Pelvic ring is intact. Degenerative changes of the hip joints are noted bilaterally. No acute fracture or dislocation is noted. No soft tissue changes are seen. IMPRESSION: No acute abnormality noted. Electronically Signed   By: Inez Catalina M.D.   On: 12/21/2020 14:43   CT Maxillofacial Wo Contrast  Result Date: 12/21/2020 CLINICAL DATA:  Multiple falls, laceration and abrasions to chin EXAM: CT HEAD WITHOUT CONTRAST CT MAXILLOFACIAL WITHOUT CONTRAST CT CERVICAL SPINE WITHOUT CONTRAST TECHNIQUE: Multidetector CT imaging of the head, cervical spine, and maxillofacial  structures were performed using the standard protocol without intravenous contrast. Multiplanar CT image reconstructions of the cervical  spine and maxillofacial structures were also generated. COMPARISON:  None. FINDINGS: CT HEAD FINDINGS Brain: No evidence of acute infarction, hemorrhage, hydrocephalus, extra-axial collection or mass lesion/mass effect. Mild ex vacuo dilatation of the lateral ventricles. Periventricular and deep white matter hypodensity. Vascular: No hyperdense vessel or unexpected calcification. CT FACIAL BONES FINDINGS Skull: Normal. Negative for fracture or focal lesion. Facial bones: No displaced fractures or dislocations. Sinuses/Orbits: No acute finding. Other: Soft tissue contusion of the chin (series 3, image 70). CT CERVICAL SPINE FINDINGS Alignment: Normal. Skull base and vertebrae: No acute fracture. No primary bone lesion or focal pathologic process. Soft tissues and spinal canal: No prevertebral fluid or swelling. No visible canal hematoma. Disc levels: Focally moderate disc space height loss and osteophytosis of C5-C6, with otherwise mild disc space height loss and osteophytosis. Upper chest: Negative. Other: None. IMPRESSION: 1. No acute intracranial pathology. Small-vessel white matter disease. 2. No displaced fracture or dislocation of the facial bones. 3. Soft tissue contusion of the chin. 4. No fracture or static subluxation of the cervical spine. 5. Focally moderate disc space height loss and osteophytosis of C5-C6, with otherwise mild disc degenerative disease. Electronically Signed   By: Eddie Candle M.D.   On: 12/21/2020 15:26    ECHOCARDIOGRAM IMPRESSIONS     1. Left ventricular ejection fraction, by estimation, is 60 to 65%. The  left ventricle has normal function. The left ventricle has no regional  wall motion abnormalities. Left ventricular diastolic parameters were  normal.   2. Right ventricular systolic function is normal. The right ventricular  size is  normal.   3. Left atrial size was mildly dilated.   4. The mitral valve is normal in structure. No evidence of mitral valve  regurgitation. No evidence of mitral stenosis.   5. The aortic valve is normal in structure. Aortic valve regurgitation is  not visualized. No aortic stenosis is present.   6. The inferior vena cava is normal in size with greater than 50%  respiratory variability, suggesting right atrial pressure of 3 mmHg.   FINDINGS   Left Ventricle: Left ventricular ejection fraction, by estimation, is 60  to 65%. The left ventricle has normal function. The left ventricle has no  regional wall motion abnormalities. The left ventricular internal cavity  size was normal in size. There is   no left ventricular hypertrophy. Left ventricular diastolic parameters  were normal. Normal left ventricular filling pressure.   Right Ventricle: The right ventricular size is normal. No increase in  right ventricular wall thickness. Right ventricular systolic function is  normal.   Left Atrium: Left atrial size was mildly dilated.   Right Atrium: Right atrial size was normal in size.   Pericardium: There is no evidence of pericardial effusion.   Mitral Valve: The mitral valve is normal in structure. No evidence of  mitral valve regurgitation. No evidence of mitral valve stenosis.   Tricuspid Valve: The tricuspid valve is normal in structure. Tricuspid  valve regurgitation is trivial. No evidence of tricuspid stenosis.   Aortic Valve: The aortic valve is normal in structure. Aortic valve  regurgitation is not visualized. No aortic stenosis is present.   Pulmonic Valve: The pulmonic valve was normal in structure. Pulmonic valve  regurgitation is not visualized. No evidence of pulmonic stenosis.   Aorta: The aortic root is normal in size and structure.   Venous: The inferior vena cava is normal in size with greater than 50%  respiratory variability, suggesting right atrial pressure of  3 mmHg.   IAS/Shunts: No atrial level shunt detected by color flow Doppler.      LEFT VENTRICLE  PLAX 2D  LVIDd:         4.20 cm  Diastology  LVIDs:         2.40 cm  LV e' medial:    7.05 cm/s  LV PW:         1.00 cm  LV E/e' medial:  10.2  LV IVS:        0.90 cm  LV e' lateral:   9.57 cm/s  LVOT diam:     2.10 cm  LV E/e' lateral: 7.5  LV SV:         87  LV SV Index:   42  LVOT Area:     3.46 cm      RIGHT VENTRICLE  RV S prime:     12.20 cm/s  TAPSE (M-mode): 2.0 cm   LEFT ATRIUM             Index       RIGHT ATRIUM           Index  LA diam:        4.10 cm 1.99 cm/m  RA Area:     11.10 cm  LA Vol (A2C):   58.0 ml 28.15 ml/m RA Volume:   22.70 ml  11.02 ml/m  LA Vol (A4C):   56.2 ml 27.28 ml/m  LA Biplane Vol: 57.5 ml 27.91 ml/m   AORTIC VALVE  LVOT Vmax:   121.00 cm/s  LVOT Vmean:  79.900 cm/s  LVOT VTI:    0.250 m     AORTA  Ao Root diam: 3.60 cm  Ao Asc diam:  3.60 cm   MITRAL VALVE               TRICUSPID VALVE  MV Area (PHT): 3.74 cm    TR Peak grad:   25.6 mmHg  MV Decel Time: 203 msec    TR Vmax:        253.00 cm/s  MV E velocity: 72.00 cm/s  MV A velocity: 54.40 cm/s  SHUNTS  MV E/A ratio:  1.32        Systemic VTI:  0.25 m                             Systemic Diam: 2.10 cm   Subjective: Seen and Examined at bedside and he is doing relatively well.  Did not feel lightheaded or dizzy  States that he is complaining of a slight headache but this is improved with acetaminophen.  PT OT evaluated and recommending home health.  He is improved and feels stronger and feels better.  No other concerns or complaints at this time is ready for discharge to follow-up with cardiology for Holter monitor outpatient.  He understands agrees with the plan of care.  Discharge Exam: Vitals:   12/22/20 1156 12/22/20 1158  BP: (!) 146/79 (!) 142/91  Pulse: 68 70  Resp: 18 20  Temp: 98.4 F (36.9 C) 98.3 F (36.8 C)  SpO2:  98%   Vitals:   12/22/20 1115 12/22/20 1153  12/22/20 1156 12/22/20 1158  BP: (!) 153/78 137/66 (!) 146/79 (!) 142/91  Pulse: 63 65 68 70  Resp: _0 Temp: 98.7 F (37.1 C) 99.1 F (37.3 C) 98.4 F (36.9 C) 98.3 F (36.8 C)  TempSrc: Oral  Oral Oral Oral  SpO2: 97% 100%  98%  Weight: 89.6 kg     Height: 5' 10.5" (1.791 m)      General: Pt is alert, awake, not in acute distress Cardiovascular: RRR, S1/S2 +, no rubs, no gallops Respiratory: Diminished  bilaterally, no wheezing, no rhonchi Abdominal: Soft, NT, ND, bowel sounds + Extremities: no edema, no cyanosis; Has cuts and abrasions from fall on legs and on his chin  The results of significant diagnostics from this hospitalization (including imaging, microbiology, ancillary and laboratory) are listed below for reference.    Microbiology: Recent Results (from the past 240 hour(s))  Resp Panel by RT-PCR (Flu A&B, Covid) Nasopharyngeal Swab     Status: None   Collection Time: 12/21/20  3:49 PM   Specimen: Nasopharyngeal Swab; Nasopharyngeal(NP) swabs in vial transport medium  Result Value Ref Range Status   SARS Coronavirus 2 by RT PCR NEGATIVE NEGATIVE Final    Comment: (NOTE) SARS-CoV-2 target nucleic acids are NOT DETECTED.  The SARS-CoV-2 RNA is generally detectable in upper respiratory specimens during the acute phase of infection. The lowest concentration of SARS-CoV-2 viral copies this assay can detect is 138 copies/mL. A negative result does not preclude SARS-Cov-2 infection and should not be used as the sole basis for treatment or other patient management decisions. A negative result may occur with  improper specimen collection/handling, submission of specimen other than nasopharyngeal swab, presence of viral mutation(s) within the areas targeted by this assay, and inadequate number of viral copies(<138 copies/mL). A negative result must be combined with clinical observations, patient history, and epidemiological information. The expected result is  Negative.  Fact Sheet for Patients:  EntrepreneurPulse.com.au  Fact Sheet for Healthcare Providers:  IncredibleEmployment.be  This test is no t yet approved or cleared by the [place] FDA and  has been authorized for detection and/or diagnosis of SARS-CoV-2 by FDA under an Emergency Use Authorization (EUA). This EUA will remain  in effect (meaning this test can be used) for the duration of the COVID-19 declaration under Section 564(b)(1) of the Act, 21 U.S.C.section 360bbb-3(b)(1), unless the authorization is terminated  or revoked sooner.       Influenza A by PCR NEGATIVE NEGATIVE Final   Influenza B by PCR NEGATIVE NEGATIVE Final    Comment: (NOTE) The Xpert Xpress SARS-CoV-2/FLU/RSV plus assay is intended as an aid in the diagnosis of influenza from Nasopharyngeal swab specimens and should not be used as a sole basis for treatment. Nasal washings and aspirates are unacceptable for Xpert Xpress SARS-CoV-2/FLU/RSV testing.  Fact Sheet for Patients: EntrepreneurPulse.com.au  Fact Sheet for Healthcare Providers: IncredibleEmployment.be  This test is not yet approved or cleared by the [place] FDA and has been authorized for detection and/or diagnosis of SARS-CoV-2 by FDA under an Emergency Use Authorization (EUA). This EUA will remain in effect (meaning this test can be used) for the duration of the COVID-19 declaration under Section 564(b)(1) of the Act, 21 U.S.C. section 360bbb-3(b)(1), unless the authorization is terminated or revoked.  Performed at [facility], [place] [address], [place], [place] [address]     Labs: BNP (last 3 results) No results for input(s): BNP in the last 8760 hours. Basic Metabolic Panel: Recent Labs  Lab 12/21/20 1306 12/22/20 0342  NA 132* 137  K 4.0 3.9  CL 95* 105  CO2 28 25  GLUCOSE 146* 134*  BUN 14 12  CREATININE 0.86 0.74   CALCIUM 9.0 8.8*  MG  --  2.1  PHOS  --  2.7   Liver Function Tests: Recent Labs  Lab 12/21/20 1340 12/22/20 0342  AST 21 18  ALT 19 17  ALKPHOS 48 40  BILITOT 0.8 0.8  PROT 6.9 6.6  ALBUMIN 3.7 3.5   No results for input(s): LIPASE, AMYLASE in the last 168 hours. No results for input(s): AMMONIA in the last 168 hours. CBC: Recent Labs  Lab 12/21/20 1306 12/22/20 0342  WBC 7.9 6.3  NEUTROABS  --  3.5  HGB 14.1 12.9*  HCT 40.8 37.0*  MCV 98.1 101.1*  PLT 305 238   Cardiac Enzymes: No results for input(s): CKTOTAL, CKMB, CKMBINDEX, TROPONINI in the last 168 hours. BNP: Invalid input(s): POCBNP CBG: Recent Labs  Lab 12/21/20 2012 12/21/20 2128 12/22/20 0647 12/22/20 0851 12/22/20 1146  GLUCAP 201* 189* 167* 179* 167*   D-Dimer No results for input(s): DDIMER in the last 72 hours. Hgb A1c Recent Labs    12/21/20 1730 12/22/20 0342  HGBA1C 7.6* 7.8*   Lipid Profile No results for input(s): CHOL, HDL, LDLCALC, TRIG, CHOLHDL, LDLDIRECT in the last 72 hours. Thyroid function studies Recent Labs    12/21/20 2145  TSH 0.647   Anemia work up No results for input(s): VITAMINB12, FOLATE, FERRITIN, TIBC, IRON, RETICCTPCT in the last 72 hours. Urinalysis    Component Value Date/Time   COLORURINE YELLOW 12/21/2020 2237   APPEARANCEUR CLEAR 12/21/2020 2237   LABSPEC 1.010 12/21/2020 2237   PHURINE 7.0 12/21/2020 2237   GLUCOSEU 50 (A) 12/21/2020 2237   HGBUR SMALL (A) 12/21/2020 2237   BILIRUBINUR NEGATIVE 12/21/2020 2237   KETONESUR NEGATIVE 12/21/2020 2237   PROTEINUR NEGATIVE 12/21/2020 2237   NITRITE NEGATIVE 12/21/2020 2237   LEUKOCYTESUR NEGATIVE 12/21/2020 2237   Sepsis Labs Invalid input(s): PROCALCITONIN,  WBC,  LACTICIDVEN Microbiology Recent Results (from the past 240 hour(s))  Resp Panel by RT-PCR (Flu A&B, Covid) Nasopharyngeal Swab     Status: None   Collection Time: 12/21/20  3:49 PM   Specimen: Nasopharyngeal Swab;  Nasopharyngeal(NP) swabs in vial transport medium  Result Value Ref Range Status   SARS Coronavirus 2 by RT PCR NEGATIVE NEGATIVE Final    Comment: (NOTE) SARS-CoV-2 target nucleic acids are NOT DETECTED.  The SARS-CoV-2 RNA is generally detectable in upper respiratory specimens during the acute phase of infection. The lowest concentration of SARS-CoV-2 viral copies this assay can detect is 138 copies/mL. A negative result does not preclude SARS-Cov-2 infection and should not be used as the sole basis for treatment or other patient management decisions. A negative result may occur with  improper specimen collection/handling, submission of specimen other than nasopharyngeal swab, presence of viral mutation(s) within the areas targeted by this assay, and inadequate number of viral copies(<138 copies/mL). A negative result must be combined with clinical observations, patient history, and epidemiological information. The expected result is Negative.  Fact Sheet for Patients:  EntrepreneurPulse.com.au  Fact Sheet for Healthcare Providers:  IncredibleEmployment.be  This test is no t yet approved or cleared by the [place] FDA and  has been authorized for detection and/or diagnosis of SARS-CoV-2 by FDA under an Emergency Use Authorization (EUA). This EUA will remain  in effect (meaning this test can be used) for the duration of the COVID-19 declaration under Section 564(b)(1) of the Act, 21 U.S.C.section 360bbb-3(b)(1), unless the authorization is terminated  or revoked sooner.       Influenza A by PCR NEGATIVE NEGATIVE Final   Influenza B by PCR NEGATIVE  NEGATIVE Final    Comment: (NOTE) The Xpert Xpress SARS-CoV-2/FLU/RSV plus assay is intended as an aid in the diagnosis of influenza from Nasopharyngeal swab specimens and should not be used as a sole basis for treatment. Nasal washings and aspirates are unacceptable for Xpert Xpress  SARS-CoV-2/FLU/RSV testing.  Fact Sheet for Patients: EntrepreneurPulse.com.au  Fact Sheet for Healthcare Providers: IncredibleEmployment.be  This test is not yet approved or cleared by the [place] FDA and has been authorized for detection and/or diagnosis of SARS-CoV-2 by FDA under an Emergency Use Authorization (EUA). This EUA will remain in effect (meaning this test can be used) for the duration of the COVID-19 declaration under Section 564(b)(1) of the Act, 21 U.S.C. section 360bbb-3(b)(1), unless the authorization is terminated or revoked.  Performed at [facility], [place] [address], [place], [place] [address]    Time coordinating discharge: 35 minutes  SIGNED:  Kerney Elbe, DO [facility] 12/22/2020, 4:32 PM Pager is on [place]  If 7PM-7AM, please contact night-coverage [email or web address]

## 2020-12-22 NOTE — Discharge Instructions (Signed)
Cardiology will send a monitor for you to wear for 2 weeks, instructions are easy.  But you can call [phone] for questions.  This is to see if an arrhythmia caused passing out episode.    You will then follow up with Dr. Geraldo Pitter, you saw him a couple of years ago prior to surgery.

## 2020-12-22 NOTE — ED Notes (Signed)
Patient complains of headache 4-5/10.

## 2020-12-22 NOTE — Progress Notes (Signed)
AVS printed. Educational teaching completed with teach back method. Dressing to all abrasions and ointment applied. Pt given dressing supplies. PIV removed. Pt transported to lobby via wheelchair. No further questions at this time.

## 2020-12-22 NOTE — Evaluation (Signed)
Occupational Therapy Evaluation Patient Details Name: Logan Hobbs MRN: [id number] DOB: 1935-03-03 Today's Date: 12/22/2020    History of Present Illness Patient is an 86 year old male admitted after dizziness and fall while walking into dentist's office. Had another recent fall after tripping over spouse's wheelchair. PMH includes  diabetes mellitus type 2 with neuropathy, hypertension, hyperlipidemia, erectile dysfunction   Clinical Impression   Patient lives with spouse at [facility] in an apartment. Patient is independent at baseline, has 24/7 caregiver support for spouse who is in a wheelchair d/t CVA. Patient does endorse 3 falls within past month related to vertigo symptoms. Currently patient overall min G assist with rolling walker for safety as he is mildly unsteady with initial narrow base of support. Acute OT to follow, do not anticipate any OT needs at D/C. Patient reports he plans to sign back up for another PT class "They have them at [facility], I did one 6 months ago."     Follow Up Recommendations  Other (comment) (plans to do PT through services at [facility])    Equipment Recommendations  None recommended by OT       Precautions / Restrictions Precautions Precautions: Fall Precaution Comments: reports 3 falls in past month Restrictions Weight Bearing Restrictions: No      Mobility Bed Mobility Overal bed mobility: Modified Independent                  Transfers Overall transfer level: Needs assistance Equipment used: Rolling walker (2 wheeled) Transfers: Sit to/from Stand Sit to Stand: Min guard         General transfer comment: for safety    Balance Overall balance assessment: History of Falls;Mild deficits observed, not formally tested                                         ADL either performed or assessed with clinical judgement   ADL Overall ADL's : Needs assistance/impaired Eating/Feeding:  Independent;Sitting Eating/Feeding Details (indicate cue type and reason): drink from cup Grooming: Set up;Sitting   Upper Body Bathing: Set up;Sitting   Lower Body Bathing: Min guard;Sitting/lateral leans;Sit to/from stand   Upper Body Dressing : Set up;Sitting   Lower Body Dressing: Min guard;Sitting/lateral leans;Sit to/from stand   Toilet Transfer: Min guard;Ambulation;RW Toilet Transfer Details (indicate cue type and reason): patient initially with narrow base of support while ambulating in room, progress to min G with ambulation in hallway using rolling walker. Toileting- Water quality scientist and Hygiene: Min guard;Sit to/from stand       Functional mobility during ADLs: Min guard;Rolling walker General ADL Comments: patient mildly unsteady with functional ambulation, increased safety using walker vs no AD. reports having walker at home that he can use as needed. plans to participate in PT services they have at [facility] once D/C'd home.                         Pertinent Vitals/Pain Pain Assessment: Faces Faces Pain Scale: Hurts little more Pain Location: head ache Pain Descriptors / Indicators: Headache Pain Intervention(s): Premedicated before session     Hand Dominance Right   Extremity/Trunk Assessment Upper Extremity Assessment Upper Extremity Assessment: Overall WFL for tasks assessed   Lower Extremity Assessment Lower Extremity Assessment: Defer to PT evaluation       Communication Communication Communication: No difficulties  Cognition Arousal/Alertness: Awake/alert Behavior During Therapy: WFL for tasks assessed/performed Overall Cognitive Status: Within Functional Limits for tasks assessed                                                Home Living Family/patient expects to be discharged to:: Private residence Living Arrangements: Spouse/significant other Available Help at Discharge: Family Type of Home:  Independent living facility [facility])       Home Layout: One level     Bathroom Shower/Tub: Walk-in Psychologist, prison and probation services: Standard     Home Equipment: Environmental consultant - 2 wheels;Shower seat;Grab bars - tub/shower   Additional Comments: spouse is in a wheelchair      Prior Functioning/Environment Level of Independence: Independent        Comments: have 24/7 aides to assist with spouse        OT Problem List: Impaired balance (sitting and/or standing);Decreased safety awareness      OT Treatment/Interventions: Self-care/ADL training;Balance training;Patient/family education;Therapeutic activities    OT Goals(Current goals can be found in the care plan section) Acute Rehab OT Goals Patient Stated Goal: home OT Goal Formulation: With patient Time For Goal Achievement: 01/05/21 Potential to Achieve Goals: Good  OT Frequency: Min 2X/week    AM-PAC OT "6 Clicks" Daily Activity     Outcome Measure Help from another person eating meals?: None Help from another person taking care of personal grooming?: A Little Help from another person toileting, which includes using toliet, bedpan, or urinal?: A Little Help from another person bathing (including washing, rinsing, drying)?: A Little Help from another person to put on and taking off regular upper body clothing?: A Little Help from another person to put on and taking off regular lower body clothing?: A Little 6 Click Score: 19   End of Session Equipment Utilized During Treatment: Rolling walker;Gait belt Nurse Communication: Mobility status  Activity Tolerance: Patient tolerated treatment well Patient left: in bed;with call bell/phone within reach  OT Visit Diagnosis: Unsteadiness on feet (R26.81);History of falling (Z91.81);Repeated falls (R29.6)                Time: [id number] OT Time Calculation (min): 22 min Charges:  OT General Charges $OT Visit: 1 Visit OT Evaluation $OT Eval Low Complexity: [place] OT OT pager: Paul [place] 12/22/2020, 9:41 AM

## 2020-12-22 NOTE — ED Notes (Signed)
Patient ambulated to the bathroom with assistance and using a walker. Patient is now back in bed.

## 2020-12-22 NOTE — Progress Notes (Signed)
  Echocardiogram 2D Echocardiogram has been performed.  Logan Hobbs 12/22/2020, 11:03 AM

## 2020-12-22 NOTE — ED Notes (Signed)
Pt ambulating with PT/OT around unit.

## 2020-12-22 NOTE — TOC Transition Note (Signed)
Transition of Care [facility]) - CM/SW Discharge Note   Patient Details  Name: HEATHE JAKOBSEN MRN: [id number] Date of Birth: 1934/07/21  [facility] CM/SW Contact:  Trish Mage, LCSW Phone Number: 12/22/2020, 3:18 PM   Clinical Narrative:   Patient who is stable for d/c was seen in follow up to recommendation for [facility] PT.  Mr Ragazzo confirms that he is a resident at [facility] in an independent living apartment, that he is open to [facility] PT, and in fact, he was scheduled to start with [facility] tomorrow.  I confirmed with [facility].  No orders needed as they already have orders from outpatient provider.  Patient is in need of ride home, and [facility] is not able to provide ride.  Called [facility] transport.  They will be here in an hour to pick him up.  No further needs identified.  TOC sign off.    Final next level of care: [place] Barriers to Discharge: No Barriers Identified   Patient Goals and CMS Choice        Discharge Placement                       Discharge Plan and Services                                     Social Determinants of Health (SDOH) Interventions     Readmission Risk Interventions No flowsheet data found.

## 2020-12-23 ENCOUNTER — Encounter: Payer: Self-pay | Admitting: *Deleted

## 2020-12-23 ENCOUNTER — Ambulatory Visit (INDEPENDENT_AMBULATORY_CARE_PROVIDER_SITE_OTHER): Payer: Medicare Other

## 2020-12-23 DIAGNOSIS — R55 Syncope and collapse: Secondary | ICD-10-CM

## 2020-12-23 NOTE — Progress Notes (Unsigned)
Patient enrolled for [employer] to mail a 14 day ZIO AT monitor to his address on file. Letter with instructions mailed to patient.

## 2020-12-25 ENCOUNTER — Ambulatory Visit: Payer: Medicare Other | Admitting: Podiatry

## 2020-12-30 ENCOUNTER — Ambulatory Visit: Payer: Medicare Other | Admitting: Podiatry

## 2020-12-30 ENCOUNTER — Telehealth: Payer: Self-pay | Admitting: Cardiology

## 2020-12-30 ENCOUNTER — Telehealth: Payer: Self-pay

## 2020-12-30 NOTE — Telephone Encounter (Signed)
Left message for pt to call back  °

## 2020-12-30 NOTE — Telephone Encounter (Signed)
Alba Destine, patient's sister-in-law, calling to request the patient have an appointment to put on his heart monitor. She says to schedule the appointment call the patient: [phone]

## 2020-12-30 NOTE — Telephone Encounter (Signed)
-----   Message from Gita Kudo, RN sent at 12/22/2020 12:49 PM EDT -----  ----- Message ----- From: Isaiah Serge, NP Sent: 12/22/2020  12:48 PM EDT To: Windy Fast Div Ash/Hp Scheduling  Please arrange follow up appt in 4-6 weeks for Dr. Geraldo Pitter for pt with syncope, he will wear 14 day zio patch.  Leaving [facility] today so just call pt later today or tomorrow.

## 2020-12-30 NOTE — Telephone Encounter (Signed)
Returned call to patient he will come to the office on 9/8 to have monitor applied

## 2020-12-31 ENCOUNTER — Other Ambulatory Visit: Payer: Self-pay

## 2020-12-31 DIAGNOSIS — R55 Syncope and collapse: Secondary | ICD-10-CM

## 2021-01-05 ENCOUNTER — Telehealth: Payer: Self-pay | Admitting: *Deleted

## 2021-01-05 NOTE — Telephone Encounter (Signed)
Courtesy call to inform us patient is sending 14 day ZIO AT monitor back due to adhesion issues.  Monitor has approximately 4 days of data on it.  Requested [employer] send a replacement monitor to patient.  They will call patient to inform him a replacement monitor is being shipped.

## 2021-01-11 ENCOUNTER — Telehealth: Payer: Self-pay | Admitting: Podiatry

## 2021-01-11 NOTE — Telephone Encounter (Signed)
Received voicemail from Logan Hobbs asking about status of diabetic shoes. Wife also left message but I could not understand her as she was talking very low.   I returned call and left message with a male to tell them we only got part of the paperwork from pcp and I have resent it with a note trying to get it completed so we can get pt his shoes. She is to let pt know.

## 2021-01-17 ENCOUNTER — Encounter (HOSPITAL_BASED_OUTPATIENT_CLINIC_OR_DEPARTMENT_OTHER): Payer: Self-pay | Admitting: Emergency Medicine

## 2021-01-17 ENCOUNTER — Other Ambulatory Visit: Payer: Self-pay

## 2021-01-17 ENCOUNTER — Emergency Department (HOSPITAL_BASED_OUTPATIENT_CLINIC_OR_DEPARTMENT_OTHER): Payer: Medicare Other

## 2021-01-17 ENCOUNTER — Emergency Department (HOSPITAL_BASED_OUTPATIENT_CLINIC_OR_DEPARTMENT_OTHER)
Admission: EM | Admit: 2021-01-17 | Discharge: 2021-01-18 | Disposition: A | Discharge status: Home / Self Care | Payer: Medicare Other | Attending: Emergency Medicine | Admitting: Emergency Medicine

## 2021-01-17 DIAGNOSIS — I10 Essential (primary) hypertension: Secondary | ICD-10-CM | POA: Diagnosis not present

## 2021-01-17 DIAGNOSIS — I722 Aneurysm of renal artery: Secondary | ICD-10-CM | POA: Diagnosis not present

## 2021-01-17 DIAGNOSIS — E1140 Type 2 diabetes mellitus with diabetic neuropathy, unspecified: Secondary | ICD-10-CM | POA: Diagnosis not present

## 2021-01-17 DIAGNOSIS — Z91040 Latex allergy status: Secondary | ICD-10-CM | POA: Insufficient documentation

## 2021-01-17 DIAGNOSIS — Z8546 Personal history of malignant neoplasm of prostate: Secondary | ICD-10-CM | POA: Insufficient documentation

## 2021-01-17 DIAGNOSIS — Z79899 Other long term (current) drug therapy: Secondary | ICD-10-CM | POA: Diagnosis not present

## 2021-01-17 DIAGNOSIS — R109 Unspecified abdominal pain: Secondary | ICD-10-CM | POA: Diagnosis present

## 2021-01-17 DIAGNOSIS — R197 Diarrhea, unspecified: Secondary | ICD-10-CM | POA: Insufficient documentation

## 2021-01-17 DIAGNOSIS — Z7984 Long term (current) use of oral hypoglycemic drugs: Secondary | ICD-10-CM | POA: Insufficient documentation

## 2021-01-17 DIAGNOSIS — K5900 Constipation, unspecified: Secondary | ICD-10-CM | POA: Insufficient documentation

## 2021-01-17 LAB — COMPREHENSIVE METABOLIC PANEL
ALT: 18 U/L (ref 0–44)
AST: 13 U/L — ABNORMAL LOW (ref 15–41)
Albumin: 4.1 g/dL (ref 3.5–5.0)
Alkaline Phosphatase: 40 U/L (ref 38–126)
Anion gap: 9 (ref 5–15)
BUN: 11 mg/dL (ref 8–23)
CO2: 24 mmol/L (ref 22–32)
Calcium: 9.7 mg/dL (ref 8.9–10.3)
Chloride: 93 mmol/L — ABNORMAL LOW (ref 98–111)
Creatinine, Ser: 0.8 mg/dL (ref 0.61–1.24)
GFR, Estimated: >60 mL/min (ref >60)
Glucose, Bld: 163 mg/dL — ABNORMAL HIGH (ref 70–99)
Potassium: 4.5 mmol/L (ref 3.5–5.1)
Sodium: 126 mmol/L — ABNORMAL LOW (ref 135–145)
Total Bilirubin: 0.7 mg/dL (ref 0.3–1.2)
Total Protein: 6.6 g/dL (ref 6.5–8.1)

## 2021-01-17 LAB — URINALYSIS, ROUTINE W REFLEX MICROSCOPIC
Bilirubin Urine: NEGATIVE
Glucose, UA: NEGATIVE mg/dL
Hgb urine dipstick: NEGATIVE
Ketones, ur: NEGATIVE mg/dL
Leukocytes,Ua: NEGATIVE
Nitrite: NEGATIVE
Protein, ur: NEGATIVE mg/dL
Specific Gravity, Urine: <1.005 — ABNORMAL LOW (ref 1.005–1.030)
pH: 5.5 (ref 5.0–8.0)

## 2021-01-17 LAB — CBC WITH DIFFERENTIAL/PLATELET
Abs Immature Granulocytes: 0.11 K/uL — ABNORMAL HIGH (ref 0.00–0.07)
Basophils Absolute: 0 K/uL (ref 0.0–0.1)
Basophils Relative: 0 %
Eosinophils Absolute: 0.1 K/uL (ref 0.0–0.5)
Eosinophils Relative: 2 %
HCT: 39.6 % (ref 39.0–52.0)
Hemoglobin: 14.2 g/dL (ref 13.0–17.0)
Immature Granulocytes: 1 %
Lymphocytes Relative: 25 %
Lymphs Abs: 2 K/uL (ref 0.7–4.0)
MCH: 34.7 pg — ABNORMAL HIGH (ref 26.0–34.0)
MCHC: 35.9 g/dL (ref 30.0–36.0)
MCV: 96.8 fL (ref 80.0–100.0)
Monocytes Absolute: 1.2 K/uL — ABNORMAL HIGH (ref 0.1–1.0)
Monocytes Relative: 15 %
Neutro Abs: 4.6 K/uL (ref 1.7–7.7)
Neutrophils Relative %: 57 %
Platelets: 260 K/uL (ref 150–400)
RBC: 4.09 MIL/uL — ABNORMAL LOW (ref 4.22–5.81)
RDW: 13.2 % (ref 11.5–15.5)
WBC: 8 K/uL (ref 4.0–10.5)
nRBC: 0 % (ref 0.0–0.2)

## 2021-01-17 LAB — LIPASE, BLOOD: Lipase: 22 U/L (ref 11–51)

## 2021-01-17 MED ORDER — LACTATED RINGERS IV BOLUS
500.0000 mL | Freq: Once | INTRAVENOUS | Status: AC
  Administered 2021-01-17: 500 mL via INTRAVENOUS

## 2021-01-17 MED ORDER — IOHEXOL 350 MG/ML SOLN
80.0000 mL | Freq: Once | INTRAVENOUS | Status: AC | PRN
  Administered 2021-01-18: 80 mL via INTRAVENOUS

## 2021-01-17 NOTE — ED Provider Notes (Signed)
[facility] EMERGENCY DEPT Provider Note   CSN: [id number] Arrival date & time: 01/17/21  2058     History Chief Complaint  Patient presents with   Abdominal Pain    Logan Hobbs is a 86 y.o. male.   Abdominal Pain Associated symptoms: constipation and diarrhea   Associated symptoms: no chest pain, no chills, no cough, no dysuria, no fatigue, no fever, no hematuria, no nausea, no shortness of breath, no sore throat and no vomiting   Patient presents for abdominal pain and fullness.  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  He has been managing this with consistent bowel regimen.  3 weeks ago, he had a fall.  He was evaluated in the hospital for syncope.  He states that, during this fall, he did strike his abdomen.  He is currently worried that he may have injured something.  Additionally, he has not had any large formed bowel movements in several weeks.  He has had some episodes of diarrhea.  After his arrival in the ED, he did have a bowel movement with a formed stool.  He reports that his abdominal discomfort and fullness have slightly improved since this bowel movement.  Patient states that he has had his normal p.o. intake lately.  He is accompanied by his daughter, who states that he has been eating less.  Patient denies any recent nausea or vomiting.  He has not had any fevers.  He denies any other areas of discomfort besides his abdomen.    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   Cataract 09/18/2020   Constipation 09/18/2020   Encounter for fitting and adjustment of hearing aid 09/18/2020   Impacted cerumen, bilateral 09/18/2020   Need for prophylactic vaccination and inoculation against influenza 09/18/2020   Obesity 09/18/2020   Rash and other nonspecific skin  eruption 09/18/2020   Ringing in ears 09/18/2020   Sensorineural hearing loss, bilateral 09/18/2020   Type 2 diabetes mellitus without complications ([place]) 40/98/1191   Trochanteric bursitis of right hip 05/28/2019   Cancer ([place]) 05/16/2018   Hearing loss 05/16/2018   Hyperlipidemia 05/16/2018   Onychomycosis 05/16/2018   Tinea cruris 05/16/2018   Vertigo 05/16/2018   Pre-operative cardiovascular examination 05/16/2018   Essential hypertension 05/16/2018   Diabetes mellitus with coincident hypertension ([place]) 03/31/2017   Blood pressure alteration 12/15/2016   Benign localized prostatic hyperplasia without lower urinary tract symptoms (LUTS) 09/13/2016   Male erectile dysfunction, unspecified 09/13/2016   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/04/2016   Gait abnormality 03/15/2015   Lumbar degenerative disc disease 03/15/2015   Neuropathy 06/20/2014   Vitamin D insufficiency 05/27/2013   Osteoarthritis 05/17/2013   Diabetes mellitus with neuropathy ([place]) 02/14/2013   Senile nuclear sclerosis 01/30/2013   Anxiety 12/13/2012   Colon polyp 02/13/2012   Allergic rhinitis 02/02/2011   Malignant neoplasm of prostate ([place]) 10/26/2010    Past Surgical History:  Procedure Laterality Date   CATARACT EXTRACTION     PROSTATE SURGERY  2009   TONSILLECTOMY     WISDOM TOOTH EXTRACTION         No family history on file.  Social History   Tobacco Use   Smoking status: Never   Smokeless tobacco: Never  Substance Use Topics   Alcohol use: Yes    Comment: beer occs.  Home Medications Prior to Admission medications   Medication Sig Start Date End Date Taking? Authorizing Provider  acetaminophen (TYLENOL) 325 MG tablet Take 325-650 mg by mouth every 6 (six) hours as needed for headache (pain).    [provider]  albuterol (VENTOLIN HFA) 108 (90 Base) MCG/ACT inhaler Inhale 2 puffs into the lungs every 6 (six) hours as needed for wheezing or shortness of breath. 11/24/09   [provider]  Blood Glucose Monitoring Suppl (ONE TOUCH ULTRA 2) w/Device KIT 3 (three) times daily as needed. 09/30/20   [provider]  cycloSPORINE (RESTASIS) 0.05 % ophthalmic emulsion Place 1 drop into both eyes 2 (two) times daily.    [provider]  escitalopram (LEXAPRO) 5 MG tablet Take 5 mg by mouth every morning. 08/26/20   [provider]  Lancet Devices (BAYER MICROLET 2 LANCING [facility]) MISC Check blood sugar once daily 07/31/15   [provider]  losartan (COZAAR) 25 MG tablet Take 25 mg by mouth every morning. 10/10/17   [provider]  mupirocin ointment (BACTROBAN) 2 % Apply topically 2 (two) times daily. 12/22/20   Sheikh, [place], DO  ONETOUCH DELICA LANCETS 30Z MISC CHECK BLOOD SUGAR ONCE DAILY 12/09/16   [provider]  [facility] ULTRA test strip USE TO CHECK BLOOD GLUCOSE ONCE DAILY 10/02/20   [provider]  rosuvastatin (CRESTOR) 10 MG tablet Take 10 mg by mouth every morning. 11/30/20   [provider]  sitaGLIPtin-metformin (JANUMET) 50-1000 MG tablet Take 1 tablet by mouth 2 (two) times daily. 10/10/17   [provider]    Allergies    Latex, Ciprofloxacin, Seasonal ic [cholestatin], Solifenacin succinate, and Tamsulosin hcl  Review of Systems   Review of Systems  Constitutional:  Negative for activity change, appetite change, chills, fatigue and fever.  HENT:  Negative for ear pain and sore throat.   Eyes:  Negative for pain and visual disturbance.  Respiratory:  Negative for cough, chest tightness and shortness of breath.   Cardiovascular:  Negative for chest pain and palpitations.  Gastrointestinal:  Positive for abdominal distention, abdominal pain, constipation and diarrhea. Negative for nausea and vomiting.  Genitourinary:  Negative for dysuria, flank pain, hematuria and testicular pain.  Musculoskeletal:  Negative for arthralgias, back pain, myalgias and neck pain.  Skin:  Negative  for color change and rash.  Neurological:  Negative for dizziness, seizures, syncope, weakness, light-headedness, numbness and headaches.  All other systems reviewed and are negative.  Physical Exam Updated Vital Signs BP (!) 152/93   Pulse 74   Temp 98.9 F (37.2 C)   Resp 18   Ht '5\' 10"'  (1.778 m)   Wt 86.2 kg   SpO2 99%   BMI 27.26 kg/m   Physical Exam Vitals and nursing note reviewed.  Constitutional:      General: He is not in acute distress.    Appearance: He is well-developed and normal weight. He is not ill-appearing, toxic-appearing or diaphoretic.  HENT:     Head: Normocephalic and atraumatic.     Mouth/Throat:     Mouth: Mucous membranes are moist.     Pharynx: Oropharynx is clear.  Eyes:     General: No scleral icterus.    Extraocular Movements: Extraocular movements intact.     Conjunctiva/sclera: Conjunctivae normal.  Cardiovascular:     Rate and Rhythm: Normal rate and regular rhythm.     Heart sounds: No murmur heard. Pulmonary:     Effort: Pulmonary  effort is normal. No respiratory distress.     Breath sounds: Normal breath sounds. No wheezing or rales.  Chest:     Chest wall: No tenderness.  Abdominal:     Palpations: Abdomen is soft.     Tenderness: There is abdominal tenderness in the left lower quadrant. There is no right CVA tenderness, left CVA tenderness, guarding or rebound.  Musculoskeletal:     Cervical back: Neck supple.  Skin:    General: Skin is warm and dry.     Capillary Refill: Capillary refill takes less than 2 seconds.     Coloration: Skin is not jaundiced or pale.  Neurological:     General: No focal deficit present.     Mental Status: He is alert and oriented to person, place, and time.     Cranial Nerves: No cranial nerve deficit.     Motor: No weakness.  Psychiatric:        Mood and Affect: Mood normal.        Behavior: Behavior normal.    ED Results / Procedures / Treatments   Labs (all labs ordered are listed, but  only abnormal results are displayed) Labs Reviewed  CBC WITH DIFFERENTIAL/PLATELET - Abnormal; Notable for the following components:      Result Value   RBC 4.09 (*)    MCH 34.7 (*)    Monocytes Absolute 1.2 (*)    Abs Immature Granulocytes 0.11 (*)    All other components within normal limits  COMPREHENSIVE METABOLIC PANEL - Abnormal; Notable for the following components:   Sodium 126 (*)    Chloride 93 (*)    Glucose, Bld 163 (*)    AST 13 (*)    All other components within normal limits  URINALYSIS, ROUTINE W REFLEX MICROSCOPIC - Abnormal; Notable for the following components:   Color, Urine COLORLESS (*)    Specific Gravity, Urine <1.005 (*)    All other components within normal limits  LIPASE, BLOOD    EKG None  Radiology CT ABDOMEN PELVIS W CONTRAST  Result Date: 01/18/2021 CLINICAL DATA:  Abdominal distension, bloating, constipation EXAM: CT ABDOMEN AND PELVIS WITH CONTRAST TECHNIQUE: Multidetector CT imaging of the abdomen and pelvis was performed using the standard protocol following bolus administration of intravenous contrast. CONTRAST:  66m OMNIPAQUE IOHEXOL 350 MG/ML SOLN COMPARISON:  None. FINDINGS: Lower chest: Mild bibasilar subpleural pulmonary fibrosis. Nodular subpleural densities within the basilar right middle lobe are nonspecific, however, there clustered configuration suggests an inflammatory origin. Cardiac size within normal limits. No pericardial effusion. Hepatobiliary: No focal liver abnormality is seen. No gallstones, gallbladder wall thickening, or biliary dilatation. Pancreas: Unremarkable Spleen: Unremarkable Adrenals/Urinary Tract: The adrenal glands are unremarkable. The kidneys are normal in size and position. Small parapelvic cyst noted within the left kidney. Partially rim calcified right renal artery aneurysm is seen within renal hilum measuring 14 x 16 x 21 mm in greatest dimension. The kidneys are otherwise unremarkable. The bladder is  unremarkable. Stomach/Bowel: The stomach, small bowel, and large bowel are unremarkable. Small to moderate stool burden noted. The appendix is not clearly identified and may be absent. No free intraperitoneal gas or fluid. Vascular/Lymphatic: Mild atherosclerotic calcification within the abdominal aorta. No aortic aneurysm. No pathologic adenopathy within the abdomen and pelvis. Reproductive: The prostate gland is not enlarged. Other: No abdominal wall hernia. Musculoskeletal: The osseous structures are age-appropriate. No lytic or blastic bone lesion is seen. No acute bone abnormality. IMPRESSION: No evidence of bowel obstruction.  Small  to moderate stool burden. Mild bibasilar subpleural pulmonary fibrosis. 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 Comparison with prior examinations, if available, would be helpful in determining stability of this lesion. Interventional radiology consultation may also be helpful for further management, if clinically indicated. Aortic Atherosclerosis (ICD10-I70.0). Electronically Signed   By: Fidela Salisbury M.D.   On: 01/18/2021 00:27    Procedures Procedures   Medications Ordered in ED Medications  lactated ringers bolus 500 mL (0 mLs Intravenous Stopped 01/18/21 0031)  iohexol (OMNIPAQUE) 350 MG/ML injection 80 mL (80 mLs Intravenous Contrast Given 01/18/21 0006)    ED Course  I have reviewed the triage vital signs and the nursing notes.  Pertinent labs & imaging results that were available during my care of the patient were reviewed by me and considered in my medical decision making (see chart for details).    MDM Rules/Calculators/A&P                           Patient is 86 year old male with history of idiopathic constipation, presenting for abdominal fullness and discomfort.  Prior to being bedded in the ED, he was able to have a formed stool.  He reports improvement in his symptoms following this.  On exam, he is well-appearing.  Vital signs are normal.  Abdomen  is soft on palpation.  He does have an area of tenderness to deep palpation in the left lower quadrant.  Given this finding and his concern of previous injury from a fall 3 weeks ago, CT scan was ordered in addition to lab work.  Care of patient was signed out to oncoming ED provider.  Final Clinical Impression(s) / ED Diagnoses Final diagnoses:  Constipation, unspecified constipation type  Renal artery aneurysm [facility])    Rx / DC Orders ED Discharge Orders     None        Godfrey Pick, MD 01/18/21 [phone]

## 2021-01-17 NOTE — ED Triage Notes (Signed)
Pt reports last significant bowel movement 3-4 weeks ago.  Pt was hospitalized post falls and is wearing a heart monitor at present for a 2 week study.  Pt reports bloating and fullness.  Pt has taken miralax at home with no relief.

## 2021-01-18 DIAGNOSIS — K5900 Constipation, unspecified: Secondary | ICD-10-CM | POA: Diagnosis not present

## 2021-01-18 NOTE — Discharge Instructions (Addendum)
You were seen today for abdominal pain.  Your CT scan confirms constipation.  You have since gone to the bathroom.  Continue your bowel regimen.  Get referral to GI from your primary physician.  You were incidentally noted to have a renal artery aneurysm.  This is of unclear significance.  You may need follow-up imaging to ensure that this is not enlarging.  Follow-up with your primary physician.

## 2021-01-18 NOTE — ED Provider Notes (Signed)
Patient signed out pending CT scan.  In brief presented with abdominal pain.  History of idiopathic constipation.  Has been unable to have bowel movement.  Since being in the emergency room, patient reports that he has had 2 bowel movements and symptoms have significantly improved.  CT scan reviewed.  No evidence of intra-abdominal pathology.  He does incidentally have a renal artery aneurysm.  This is of unclear significance.  Renal function appears preserved.  Patient and family advised of this finding.  May need surveillance imaging to ensure stability.    After history, exam, and medical workup I feel the patient has been appropriately medically screened and is safe for discharge home. Pertinent diagnoses were discussed with the patient. Patient was given return precautions.  Physical Exam  BP (!) 161/92   Pulse 70   Temp 98.9 F (37.2 C)   Resp 18   Ht 1.778 m (5\' 10" )   Wt 86.2 kg   SpO2 99%   BMI 27.26 kg/m   Physical Exam  ED Course/Procedures     Procedures  MDM   Problem List Items Addressed This Visit       Other   Constipation - Primary   Other Visit Diagnoses     Renal artery aneurysm ([facility])                 Dina Rich Barbette Hair, MD 01/18/21 [phone]

## 2021-02-01 ENCOUNTER — Telehealth: Payer: Self-pay

## 2021-02-01 DIAGNOSIS — I4729 Other ventricular tachycardia: Secondary | ICD-10-CM

## 2021-02-01 DIAGNOSIS — I2510 Atherosclerotic heart disease of native coronary artery without angina pectoris: Secondary | ICD-10-CM

## 2021-02-01 NOTE — Telephone Encounter (Signed)
-----   Message from Jenean Lindau, MD sent at 01/29/2021  4:10 PM EDT ----- Mildly abnormal.  Good idea to get Lexiscan sestamibi to assess for an objective evidence of coronary artery disease.  Copy primary care. Jenean Lindau, MD 01/29/2021 4:10 PM

## 2021-02-01 NOTE — Telephone Encounter (Signed)
Spoke with patient regarding results and recommendation.  Patient verbalizes understanding and is agreeable to plan of care. Advised patient to call back with any issues or concerns.      Please arrive 15 minutes prior to your appointment time for registration and insurance purposes.  The test will take approximately 3 to 4 hours to complete; you may bring reading material.  If someone comes with you to your appointment, they will need to remain in the main lobby due to limited space in the testing area. **If you are pregnant or breastfeeding, please notify the nuclear lab prior to your appointment**  How to prepare for your Myocardial Perfusion Test: Do not eat or drink 3 hours prior to your test, except you may have water. Do not consume products containing caffeine (regular or decaffeinated) 12 hours prior to your test. (ex: coffee, chocolate, sodas, tea). Do bring a list of your current medications with you.  If not listed below, you may take your medications as normal. Do wear comfortable clothes (no dresses or overalls) and walking shoes, tennis shoes preferred (No heels or open toe shoes are allowed). Do NOT wear cologne, perfume, aftershave, or lotions (deodorant is allowed). If these instructions are not followed, your test will have to be rescheduled.  If you have questions or concerns about your appointment, you can call the [facility] at [phone].  If you cannot keep your appointment, please provide 24 hours notification to the Nuclear Lab, to avoid a possible $50 charge to your account.

## 2021-02-02 NOTE — Telephone Encounter (Signed)
Spoke to the patient just now and got him scheduled to see Dr. Geraldo Pitter on 02/26/2021 as requested.

## 2021-02-02 NOTE — Addendum Note (Signed)
Addended by: Truddie Hidden on: 02/02/2021 03:03 PM   Modules accepted: Orders

## 2021-02-02 NOTE — Addendum Note (Signed)
Addended by: Jyl Heinz R on: 02/02/2021 03:06 PM   Modules accepted: Orders

## 2021-02-10 ENCOUNTER — Other Ambulatory Visit: Payer: Self-pay

## 2021-02-10 ENCOUNTER — Telehealth (HOSPITAL_COMMUNITY): Payer: Self-pay | Admitting: *Deleted

## 2021-02-10 DIAGNOSIS — R251 Tremor, unspecified: Secondary | ICD-10-CM

## 2021-02-10 DIAGNOSIS — T148XXA Other injury of unspecified body region, initial encounter: Secondary | ICD-10-CM

## 2021-02-10 DIAGNOSIS — R2689 Other abnormalities of gait and mobility: Secondary | ICD-10-CM | POA: Insufficient documentation

## 2021-02-10 DIAGNOSIS — F09 Unspecified mental disorder due to known physiological condition: Secondary | ICD-10-CM

## 2021-02-10 DIAGNOSIS — Z9181 History of falling: Secondary | ICD-10-CM

## 2021-02-10 HISTORY — DX: Unspecified mental disorder due to known physiological condition: F09

## 2021-02-10 HISTORY — DX: Tremor, unspecified: R25.1

## 2021-02-10 HISTORY — DX: Other abnormalities of gait and mobility: R26.89

## 2021-02-10 HISTORY — DX: History of falling: Z91.81

## 2021-02-10 HISTORY — DX: Other injury of unspecified body region, initial encounter: T14.8XXA

## 2021-02-10 NOTE — Telephone Encounter (Signed)

## 2021-02-16 ENCOUNTER — Telehealth: Payer: Self-pay | Admitting: Nurse Practitioner

## 2021-02-16 NOTE — Progress Notes (Signed)
Office Note     CC: Incidental right renal artery aneurysm Requesting Provider:  Marda Stalker, PA-C  HPI: Logan Hobbs is a 86 y.o. 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   He was accompanied by his aide as both he and his wife are wheelchair dependent.  On exam, Logan Hobbs was doing well.  He stated he has had several recent falls and was happy that his recent cardiac stress test went well.  Logan Hobbs was not aware of any aneurysm history in his family, was not aware of his right renal artery aneurysm until it was discovered in his most recent CT.  He denied abdominal pain, flank pain, but has chronic lumbar back pain.  He denied rest pain in his lower extremities and tissue loss.  The pt is  on a statin for cholesterol management.  The pt is not on a daily aspirin.   Other AC:  - The pt is on medication for hypertension.   The pt is  diabetic.  Tobacco hx:  never  Past Medical History:  Diagnosis Date   Abrasion 02/10/2021   Dec 25, 2020 Entered By: Ree Kida Comment: Status post fall   Allergic rhinitis 02/02/2011   Anxiety 12/13/2012   [facility] when necessary.   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 Followed by Dr. Farris Has at [facility].   Cataract 09/18/2020   Cognitive disorder 02/10/2021   Colon polyp 02/13/2012   Dec. 16, 2011 and next one due 5 years   Constipation 09/18/2020   Diabetes mellitus with coincident hypertension ([place]) 03/31/2017   Diabetes mellitus with neuropathy ([place]) 02/14/2013   Encounter for fitting and adjustment of hearing aid  09/18/2020   Essential hypertension 05/16/2018   Gait abnormality 03/15/2015   Followed by Dr. Yolonda Kida with [facility]. He does have neuropathy. Dr. Audley Hose note 07/03/14 says etiology of apractic gait unclear. Ongoing neurology follow up.   Hearing loss 05/16/2018   History of fall 02/10/2021   Hyperlipidemia 05/16/2018   Impacted cerumen, bilateral 09/18/2020   Lumbar degenerative disc disease 03/15/2015   Mild multilevel degenerative disc changes on MRI of the lumbar spine ordered by Dr. Yolonda Kida, done 06/15/14   Male erectile dysfunction, unspecified 09/13/2016   Malignant neoplasm of prostate ([place]) 10/26/2010   Need for prophylactic vaccination and inoculation against influenza 09/18/2020   Neuropathy 06/20/2014   Obesity 09/18/2020   Onychomycosis 05/16/2018   Osteoarthritis 05/17/2013   Left shoulder   Other abnormalities of gait and mobility 02/10/2021   Pre-operative cardiovascular examination 05/16/2018   Rash and other nonspecific skin eruption 09/18/2020   Ringing in ears 09/18/2020   Senile nuclear sclerosis 01/30/2013   Sensorineural hearing loss, bilateral 09/18/2020   Syncope and collapse 12/21/2020   Tinea cruris 05/16/2018   Tremor 02/10/2021   Trochanteric bursitis of right hip 05/28/2019   Type 2 diabetes mellitus without complications ([place]) 9/48/5462   Vertigo 05/16/2018   Vitamin D insufficiency 05/27/2013    Past Surgical History:  Procedure Laterality Date   CATARACT EXTRACTION     PROSTATE SURGERY  2009   TONSILLECTOMY     WISDOM TOOTH  EXTRACTION      Social History   Socioeconomic History   Marital status: Married    Spouse name: Not on file   Number of children: Not on file   Years of education: Not on file   Highest education level: Not on file  Occupational History   Not on file  Tobacco Use   Smoking status: Never   Smokeless tobacco: Never  Substance and Sexual Activity   Alcohol use: Yes    Comment: beer occs.   Drug use: Not on file   Sexual  activity: Not on file  Other Topics Concern   Not on file  Social History Narrative   Not on file   Social Determinants of Health   Financial Resource Strain: Not on file  Food Insecurity: Not on file  Transportation Needs: Not on file  Physical Activity: Not on file  Stress: Not on file  Social Connections: Not on file  Intimate Partner Violence: Not on file   No family history on file.  Current Outpatient Medications  Medication Sig Dispense Refill   acetaminophen (TYLENOL) 325 MG tablet Take 325-650 mg by mouth every 6 (six) hours as needed for headache.     albuterol (VENTOLIN HFA) 108 (90 Base) MCG/ACT inhaler Inhale 2 puffs into the lungs every 6 (six) hours as needed for wheezing or shortness of breath.     cycloSPORINE (RESTASIS) 0.05 % ophthalmic emulsion Place 1 drop into both eyes 2 (two) times daily.     escitalopram (LEXAPRO) 5 MG tablet Take 5 mg by mouth every morning.     losartan (COZAAR) 50 MG tablet Take 50 mg by mouth daily.     OXcarbazepine (TRILEPTAL) 150 MG tablet Take 1.5 tablets by mouth daily.     rosuvastatin (CRESTOR) 5 MG tablet Take 5 mg by mouth daily.     sitaGLIPtin-metformin (JANUMET) 50-1000 MG tablet Take 1 tablet by mouth 2 (two) times daily.     No current facility-administered medications for this visit.    Allergies  Allergen Reactions   Latex Shortness Of Breath    Skin itching and breathing problems   Ciprofloxacin Other (See Comments)    constipation   Seasonal Ic [Cholestatin] Other (See Comments)    Unknown reaction   Solifenacin Succinate Other (See Comments)    constipation   Tamsulosin Hcl     Weakness, vertigo, and nausea     REVIEW OF SYSTEMS:   [X]  denotes positive finding, [ ]  denotes negative finding Cardiac  Comments:  Chest pain or chest pressure:    Shortness of breath upon exertion:    Short of breath when lying flat:    Irregular heart rhythm:        Vascular    Pain in calf, thigh, or hip brought on  by ambulation:    Pain in feet at night that wakes you up from your sleep:     Blood clot in your veins:    Leg swelling:         Pulmonary    Oxygen at home:    Productive cough:     Wheezing:         Neurologic    Sudden weakness in arms or legs:     Sudden numbness in arms or legs:     Sudden onset of difficulty speaking or slurred speech:    Temporary loss of vision in one eye:     Problems with dizziness:  Gastrointestinal    Blood in stool:     Vomited blood:         Genitourinary    Burning when urinating:     Blood in urine:        Psychiatric    Major depression:         Hematologic    Bleeding problems:    Problems with blood clotting too easily:        Skin    Rashes or ulcers:        Constitutional    Fever or chills:      PHYSICAL EXAMINATION:  There were no vitals filed for this visit.  General:  WDWN in NAD; vital signs documented above Gait: Not observed HENT: WNL, normocephalic Pulmonary: normal non-labored breathing , without Rales, rhonchi,  wheezing Cardiac: regular HR,  Abdomen: soft, NT, no masses Skin: without rashes Vascular Exam/Pulses:  Right Left  Radial 2+ (normal) 2+ (normal)  Ulnar 2+ (normal) 2+ (normal)  Femoral    Popliteal    DP 2+ (normal) 2+ (normal)  PT 2+ (normal) 2+ (normal)   Extremities: without ischemic changes, without Gangrene , without cellulitis; without open wounds;  Musculoskeletal: no muscle wasting or atrophy  Neurologic: A&O X 3;  No focal weakness or paresthesias are detected Psychiatric:  The pt has Normal affect.   Non-Invasive Vascular Imaging:       ASSESSMENT/PLAN: Logan Hobbs is a 86 y.o. 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  He has no prior imaging.  Currently the aneurysm is measuring 2 cm and is distal to the main renal artery, appearing to involve the segmental branches.  There is no thrombus appreciated within the aneurysm, and the wall  appears calcified. Society for Vascular Surgery guidelines recommend treatment of renal artery aneurysms greater than 3 cm.   Logan Hobbs would benefit from continued surveillance.  If he reached a size greater than 3 cm I would first define the aneurysm using renal angiography with an attempt to treat it endovascularly.  Its location may require involvement from my urology colleagues and possible ex-vivo repair.    I Logan Hobbs see him back in the office in 6 months and Logan Hobbs assess for aneurysmal growth using renal duplex ultrasonography.   Broadus John, MD [facility] [phone]

## 2021-02-16 NOTE — Telephone Encounter (Signed)
Received call from operator with patient, family, and care giver on the line for instructions for lexiscan scheduled for 10/26. We reviewed medications and additional instructions regarding po intake and holding caffeine for 12 hours. I answered questions to patient's satisfaction and he thanked me for the call.

## 2021-02-17 ENCOUNTER — Other Ambulatory Visit: Payer: Self-pay

## 2021-02-17 ENCOUNTER — Ambulatory Visit (HOSPITAL_COMMUNITY): Payer: Medicare Other | Attending: Cardiovascular Disease

## 2021-02-17 DIAGNOSIS — I2510 Atherosclerotic heart disease of native coronary artery without angina pectoris: Secondary | ICD-10-CM | POA: Insufficient documentation

## 2021-02-17 LAB — MYOCARDIAL PERFUSION IMAGING
Base ST Depression (mm): 0 mm
LV dias vol: 63 mL (ref 62–150)
LV sys vol: 22 mL
Nuc Stress EF: 65 %
Peak HR: 92 {beats}/min
Rest HR: 68 {beats}/min
Rest Nuclear Isotope Dose: 10.5 mCi
SDS: 3
SRS: 0
SSS: 3
ST Depression (mm): 0 mm
Stress Nuclear Isotope Dose: 30.8 mCi
TID: 0.89

## 2021-02-17 MED ORDER — TECHNETIUM TC 99M TETROFOSMIN IV KIT
30.8000 | PACK | Freq: Once | INTRAVENOUS | Status: AC | PRN
  Administered 2021-02-17: 30.8 via INTRAVENOUS
  Filled 2021-02-17: qty 31

## 2021-02-17 MED ORDER — REGADENOSON 0.4 MG/5ML IV SOLN
0.4000 mg | Freq: Once | INTRAVENOUS | Status: AC
  Administered 2021-02-17: 0.4 mg via INTRAVENOUS

## 2021-02-17 MED ORDER — TECHNETIUM TC 99M TETROFOSMIN IV KIT
10.5000 | PACK | Freq: Once | INTRAVENOUS | Status: AC | PRN
  Administered 2021-02-17: 10.5 via INTRAVENOUS
  Filled 2021-02-17: qty 11

## 2021-02-19 ENCOUNTER — Encounter: Payer: Self-pay | Admitting: Vascular Surgery

## 2021-02-19 ENCOUNTER — Ambulatory Visit (INDEPENDENT_AMBULATORY_CARE_PROVIDER_SITE_OTHER): Payer: Medicare Other | Admitting: Vascular Surgery

## 2021-02-19 ENCOUNTER — Other Ambulatory Visit: Payer: Self-pay

## 2021-02-19 VITALS — BP 130/79 | HR 80 | Temp 98.0°F | Resp 20

## 2021-02-19 DIAGNOSIS — I722 Aneurysm of renal artery: Secondary | ICD-10-CM | POA: Diagnosis not present

## 2021-02-24 ENCOUNTER — Other Ambulatory Visit: Payer: Self-pay

## 2021-02-24 DIAGNOSIS — I722 Aneurysm of renal artery: Secondary | ICD-10-CM

## 2021-02-26 ENCOUNTER — Encounter: Payer: Self-pay | Admitting: Cardiology

## 2021-02-26 ENCOUNTER — Ambulatory Visit (INDEPENDENT_AMBULATORY_CARE_PROVIDER_SITE_OTHER): Payer: Medicare Other | Admitting: Cardiology

## 2021-02-26 ENCOUNTER — Other Ambulatory Visit: Payer: Self-pay

## 2021-02-26 VITALS — BP 128/70 | HR 86 | Ht 70.6 in | Wt 179.0 lb

## 2021-02-26 DIAGNOSIS — Z8546 Personal history of malignant neoplasm of prostate: Secondary | ICD-10-CM

## 2021-02-26 DIAGNOSIS — I722 Aneurysm of renal artery: Secondary | ICD-10-CM

## 2021-02-26 DIAGNOSIS — M109 Gout, unspecified: Secondary | ICD-10-CM

## 2021-02-26 DIAGNOSIS — J301 Allergic rhinitis due to pollen: Secondary | ICD-10-CM | POA: Insufficient documentation

## 2021-02-26 DIAGNOSIS — E1142 Type 2 diabetes mellitus with diabetic polyneuropathy: Secondary | ICD-10-CM | POA: Insufficient documentation

## 2021-02-26 DIAGNOSIS — E7800 Pure hypercholesterolemia, unspecified: Secondary | ICD-10-CM

## 2021-02-26 DIAGNOSIS — I700 Atherosclerosis of aorta: Secondary | ICD-10-CM

## 2021-02-26 DIAGNOSIS — E0840 Diabetes mellitus due to underlying condition with diabetic neuropathy, unspecified: Secondary | ICD-10-CM

## 2021-02-26 DIAGNOSIS — K5904 Chronic idiopathic constipation: Secondary | ICD-10-CM | POA: Insufficient documentation

## 2021-02-26 DIAGNOSIS — R296 Repeated falls: Secondary | ICD-10-CM | POA: Insufficient documentation

## 2021-02-26 DIAGNOSIS — I6523 Occlusion and stenosis of bilateral carotid arteries: Secondary | ICD-10-CM | POA: Insufficient documentation

## 2021-02-26 DIAGNOSIS — I10 Essential (primary) hypertension: Secondary | ICD-10-CM

## 2021-02-26 HISTORY — DX: Gout, unspecified: M10.9

## 2021-02-26 HISTORY — DX: Personal history of malignant neoplasm of prostate: Z85.46

## 2021-02-26 HISTORY — DX: Type 2 diabetes mellitus with diabetic polyneuropathy: E11.42

## 2021-02-26 HISTORY — DX: Chronic idiopathic constipation: K59.04

## 2021-02-26 HISTORY — DX: Atherosclerosis of aorta: I70.0

## 2021-02-26 HISTORY — DX: Aneurysm of renal artery: I72.2

## 2021-02-26 HISTORY — DX: Occlusion and stenosis of bilateral carotid arteries: I65.23

## 2021-02-26 HISTORY — DX: Allergic rhinitis due to pollen: J30.1

## 2021-02-26 HISTORY — DX: Pure hypercholesterolemia, unspecified: E78.00

## 2021-02-26 HISTORY — DX: Repeated falls: R29.6

## 2021-02-26 NOTE — Progress Notes (Signed)
Cardiology Office Note:    Date:  02/26/2021   ID:  Logan Hobbs, DOB [birth year], MRN [id number]  PCP:  Marda Stalker, PA-C  Cardiologist:  Jenean Lindau, MD   Referring MD: Marda Stalker, PA-C    ASSESSMENT:    1. Aneurysm of renal artery (HCC)   2. Essential hypertension   3. Diabetes mellitus due to underlying condition with diabetic neuropathy, without long-term current use of insulin ([place])   4. Pure hypercholesterolemia    PLAN:    In order of problems listed above:  Primary prevention stressed with the patient.  Importance of compliance with diet medication stressed and he vocalized understanding.  He had multiple questions which were answered to his satisfaction.  Follow-up precautions were advised and he promises to do better. Essential hypertension: Blood pressure stable and diet was emphasized.  Lifestyle modification urged. Mixed dyslipidemia: Lipids were reviewed diet was emphasized.  He is on statin therapy.  Lipids followed by primary care. Diabetes mellitus: Managed by primary care.  I discussed the importance of diet and remaining active.  Again he walks with a walker now.  He is also followed by neurology for his neuropathy issues. Eliquis Chem-7 and CBC and we will do this for him. Patient will be seen in follow-up appointment in 6 months or earlier if the patient has any concerns    Medication Adjustments/Labs and Tests Ordered: Current medicines are reviewed at length with the patient today.  Concerns regarding medicines are outlined above.  No orders of the defined types were placed in this encounter.  No orders of the defined types were placed in this encounter.    No chief complaint on file.    History of Present Illness:    Logan Hobbs is a 86 y.o. male.  Patient has past medical history of essential hypertension, mixed dyslipidemia, diabetes mellitus.  He has had falls.  Patient mentions to me that he has diabetic neuropathy.   He denies any chest pain orthopnea or PND.  Overall his stress testing and echocardiogram are unremarkable.  His event monitoring was also unremarkable and there was only only 1 4 beat nonsustained ventricular tachycardia.  At the time of my evaluation, the patient is alert awake oriented and in no distress.  Past Medical History:  Diagnosis Date   Abrasion 02/10/2021   Dec 25, 2020 Entered By: Ree Kida Comment: Status post fall   Allergic rhinitis 02/02/2011   Anxiety 12/13/2012   [facility] when necessary.   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 Followed by Dr. Farris Has at [facility].   Cataract 09/18/2020   Cognitive disorder 02/10/2021   Colon polyp 02/13/2012   Dec. 16, 2011 and next one due 5 years   Constipation 09/18/2020   Diabetes mellitus with coincident hypertension ([place]) 03/31/2017   Diabetes mellitus with neuropathy ([place]) 02/14/2013   Encounter for fitting and adjustment of hearing aid 09/18/2020   Essential hypertension 05/16/2018   Gait abnormality 03/15/2015   Followed by Dr. Yolonda Kida with [facility]. He does have neuropathy. Dr. Audley Hose note 07/03/14 says etiology of apractic gait unclear. Ongoing neurology follow up.   Hearing loss 05/16/2018   History of  fall 02/10/2021   Hyperlipidemia 05/16/2018   Impacted cerumen, bilateral 09/18/2020   Lumbar degenerative disc disease 03/15/2015   Mild multilevel degenerative disc changes on MRI of the lumbar spine ordered by Dr. Yolonda Kida, done 06/15/14   Male erectile dysfunction, unspecified 09/13/2016   Malignant neoplasm of prostate ([place]) 10/26/2010   Need for prophylactic vaccination  and inoculation against influenza 09/18/2020   Neuropathy 06/20/2014   Obesity 09/18/2020   Onychomycosis 05/16/2018   Osteoarthritis 05/17/2013   Left shoulder   Other abnormalities of gait and mobility 02/10/2021   Pre-operative cardiovascular examination 05/16/2018   Rash and other nonspecific skin eruption 09/18/2020   Ringing in ears 09/18/2020   Senile nuclear sclerosis 01/30/2013   Sensorineural hearing loss, bilateral 09/18/2020   Syncope and collapse 12/21/2020   Tinea cruris 05/16/2018   Tremor 02/10/2021   Trochanteric bursitis of right hip 05/28/2019   Type 2 diabetes mellitus without complications ([place]) 5/36/1443   Vertigo 05/16/2018   Vitamin D insufficiency 05/27/2013    Past Surgical History:  Procedure Laterality Date   CATARACT EXTRACTION     PROSTATE SURGERY  2009   TONSILLECTOMY     WISDOM TOOTH EXTRACTION      Current Medications: Current Meds  Medication Sig   acetaminophen (TYLENOL) 500 MG tablet Take 500 mg by mouth in the morning and at bedtime.   Cholecalciferol (D3-1000 PO) Take 1,000 mg by mouth daily.   cycloSPORINE (RESTASIS) 0.05 % ophthalmic emulsion Place 1 drop into both eyes 2 (two) times daily.   escitalopram (LEXAPRO) 5 MG tablet Take 5 mg by mouth every morning.   Magnesium Hydroxide (DULCOLAX PO) Take 60 mg by mouth every other day.     Allergies:   Latex, Ciprofloxacin, Seasonal ic [cholestatin], Solifenacin, Solifenacin succinate, and Tamsulosin hcl   Social History   Socioeconomic History   Marital status: Married    Spouse name: Not on file   Number of children: Not on file   Years of education: Not on file   Highest education level: Not on file  Occupational History   Not on file  Tobacco Use   Smoking status: Never   Smokeless tobacco: Never  Substance and Sexual Activity   Alcohol use: Yes    Comment: beer occs.   Drug use: Not on file   Sexual activity: Not on file  Other Topics Concern   Not on file  Social History Narrative    Not on file   Social Determinants of Health   Financial Resource Strain: Not on file  Food Insecurity: Not on file  Transportation Needs: Not on file  Physical Activity: Not on file  Stress: Not on file  Social Connections: Not on file     Family History: The patient's family history includes Cancer in his brother; Heart disease in his brother and father; Hypertension in his father. There is no history of Diabetes.  ROS:   Please see the history of present illness.    All other systems reviewed and are negative.  EKGs/Labs/Other Studies Reviewed:    The following studies were reviewed today: I discussed my findings with the patient at extensive length including stress test echo and monitoring reports.   Recent Labs: 12/21/2020: TSH 0.647 12/22/2020: Magnesium 2.1 01/17/2021: ALT 18; BUN 11; Creatinine, Ser 0.80; Hemoglobin 14.2; Platelets 260; Potassium 4.5; Sodium 126  Recent Lipid Panel No results found for: CHOL, TRIG, HDL, CHOLHDL, VLDL, LDLCALC, LDLDIRECT  Physical Exam:    VS:  BP 128/70  Pulse 86   Ht 5' 10.6" (1.793 m)   Wt 179 lb (81.2 kg)   SpO2 96%   BMI 25.25 kg/m     Wt Readings from Last 3 Encounters:  02/26/21 179 lb (81.2 kg)  02/17/21 190 lb (86.2 kg)  01/17/21 190 lb (86.2 kg)     GEN: Patient is in no acute distress HEENT: Normal NECK: No JVD; No carotid bruits LYMPHATICS: No lymphadenopathy CARDIAC: Hear sounds regular, 2/6 systolic murmur at the apex. RESPIRATORY:  Clear to auscultation without rales, wheezing or rhonchi  ABDOMEN: Soft, non-tender, non-distended MUSCULOSKELETAL:  No edema; No deformity  SKIN: Warm and dry NEUROLOGIC:  Alert and oriented x 3 PSYCHIATRIC:  Normal affect   Signed, Jenean Lindau, MD  02/26/2021 11:00 AM    [facility]

## 2021-02-26 NOTE — Patient Instructions (Signed)
Medication Instructions:  Your physician recommends that you continue on your current medications as directed. Please refer to the Current Medication list given to you today.  *If you need a refill on your cardiac medications before your next appointment, please call your pharmacy*   Lab Work: Your physician recommends that you have labs done in the office today. Your test included  basic metabolic panel and complete blood count.  If you have labs (blood work) drawn today and your tests are completely normal, you will receive your results only by: [place] (if you have MyChart) OR A paper copy in the mail If you have any lab test that is abnormal or we need to change your treatment, we will call you to review the results.   Testing/Procedures: None ordered   Follow-Up: At [facility], you and your health needs are our priority.  As part of our continuing mission to provide you with exceptional heart care, we have created designated Provider Care Teams.  These Care Teams include your primary Cardiologist (physician) and Advanced Practice Providers (APPs -  Physician Assistants and Nurse Practitioners) who all work together to provide you with the care you need, when you need it.  We recommend signing up for the patient portal called "MyChart".  Sign up information is provided on this After Visit Summary.  MyChart is used to connect with patients for Virtual Visits (Telemedicine).  Patients are able to view lab/test results, encounter notes, upcoming appointments, etc.  Non-urgent messages can be sent to your provider as well.   To learn more about what you can do with MyChart, go to [email or web address].    Your next appointment:   6 month(s)  The format for your next appointment:   In Person  Provider:   Jyl Heinz, MD   Other Instructions NA

## 2021-02-27 LAB — CBC WITH DIFFERENTIAL/PLATELET
Basophils Absolute: 0 10*3/uL (ref 0.0–0.2)
Basos: 0 %
EOS (ABSOLUTE): 0.1 10*3/uL (ref 0.0–0.4)
Eos: 1 %
Hematocrit: 46.3 % (ref 37.5–51.0)
Hemoglobin: 15.5 g/dL (ref 13.0–17.7)
Immature Grans (Abs): 0.1 10*3/uL (ref 0.0–0.1)
Immature Granulocytes: 1 %
Lymphocytes Absolute: 1.8 10*3/uL (ref 0.7–3.1)
Lymphs: 21 %
MCH: 32.5 pg (ref 26.6–33.0)
MCHC: 33.5 g/dL (ref 31.5–35.7)
MCV: 97 fL (ref 79–97)
Monocytes Absolute: 1 10*3/uL — ABNORMAL HIGH (ref 0.1–0.9)
Monocytes: 12 %
Neutrophils Absolute: 5.4 10*3/uL (ref 1.4–7.0)
Neutrophils: 65 %
Platelets: 301 10*3/uL (ref 150–450)
RBC: 4.77 x10E6/uL (ref 4.14–5.80)
RDW: 12.2 % (ref 11.6–15.4)
WBC: 8.4 10*3/uL (ref 3.4–10.8)

## 2021-02-27 LAB — BASIC METABOLIC PANEL
BUN/Creatinine Ratio: 14 (ref 10–24)
BUN: 11 mg/dL (ref 8–27)
CO2: 22 mmol/L (ref 20–29)
Calcium: 9.2 mg/dL (ref 8.6–10.2)
Chloride: 98 mmol/L (ref 96–106)
Creatinine, Ser: 0.81 mg/dL (ref 0.76–1.27)
Glucose: 144 mg/dL — ABNORMAL HIGH (ref 70–99)
Potassium: 4.8 mmol/L (ref 3.5–5.2)
Sodium: 134 mmol/L (ref 134–144)
eGFR: 87 mL/min/{1.73_m2} (ref >59)

## 2021-03-01 ENCOUNTER — Telehealth: Payer: Self-pay | Admitting: Podiatry

## 2021-03-01 NOTE — Telephone Encounter (Signed)
Pts caregiver Freda Munro called and left message checking on pts diabetic shoes.  I returned call and spoke to pt to let him know we have not received the needed documents from pcp office and it has been faxed multiple times. I have refaxed it today. Pt is to call the pcp office to see if they can get it taken care of. I told pt that everything is ordered just waiting on the documents so that the inserts can be made.

## 2021-03-04 ENCOUNTER — Telehealth: Payer: Self-pay | Admitting: Podiatry

## 2021-03-04 NOTE — Telephone Encounter (Signed)
Pts friend left message asking if we had gotten the paperwork for pts diabetic shoes.  Upon checking [facility] needed [facility] it to them.  I notified his friend and she said [facility] and I told her I would work on this tomorrow.she said thank you

## 2021-03-05 ENCOUNTER — Ambulatory Visit: Payer: Medicare Other | Admitting: Podiatry

## 2021-03-05 ENCOUNTER — Telehealth: Payer: Self-pay | Admitting: Podiatry

## 2021-03-05 NOTE — Telephone Encounter (Signed)
Spoke to sheila yesterday and she told me that [facility] and I have changed the medicare documents to Dr Ishmael Holter and it will be faxed to her office.   Today I received a voicemail from pts wife stating to fax the paperwork to [place] and they will sign the paperwork for pt to get shoes. Per wife pt really needs shoes... please, please,please help him.  I have refaxed it to Dr Kathyrn Lass today as well.

## 2021-03-09 ENCOUNTER — Telehealth: Payer: Self-pay | Admitting: Podiatry

## 2021-03-09 NOTE — Telephone Encounter (Signed)
Pts caregiver Freda Munro called and left message asking if we got the documents needed for pt to get his shoes and to pick them up at the appt on 11.21...  I returned call and explained we did get the documents needed but not until 11.11 so the inserts are in production and they usually take about 2 wks to make and get shipped out so I don't think they will be here for the appt on 11.21 but I will call when they come in.

## 2021-03-15 ENCOUNTER — Ambulatory Visit: Payer: Medicare Other | Admitting: Podiatry

## 2021-03-22 ENCOUNTER — Ambulatory Visit: Payer: Medicare Other | Admitting: Cardiology

## 2021-04-01 ENCOUNTER — Other Ambulatory Visit: Payer: Self-pay

## 2021-04-01 ENCOUNTER — Ambulatory Visit: Payer: Medicare Other

## 2021-04-01 DIAGNOSIS — M2042 Other hammer toe(s) (acquired), left foot: Secondary | ICD-10-CM

## 2021-04-01 DIAGNOSIS — E119 Type 2 diabetes mellitus without complications: Secondary | ICD-10-CM

## 2021-04-01 DIAGNOSIS — E1140 Type 2 diabetes mellitus with diabetic neuropathy, unspecified: Secondary | ICD-10-CM

## 2021-04-01 DIAGNOSIS — M2041 Other hammer toe(s) (acquired), right foot: Secondary | ICD-10-CM

## 2021-04-01 NOTE — Progress Notes (Signed)
SITUATION Reason for Visit: Fitting of Diabetic Shoes & Insoles Patient / Caregiver Report:  Patient is satisfied with shoes and insoles  OBJECTIVE DATA: Patient History / Diagnosis:  Type 2 diabetes mellitus without complication, unspecified whether long term insulin use ([place])  Change in Status:   None  ACTIONS PERFORMED: In-Person Delivery, patient was fit with: - 1x pair A5500 PDAC approved prefabricated Diabetic Shoes: APEX V551M 11W - 3x pair [id number] PDAC approved CAM milled custom diabetic insoles  Shoes and insoles were verified for structural integrity and safety. Patient wore shoes and insoles in office. Skin was inspected and free of areas of concern after wearing shoes and inserts. Shoes and inserts fit properly. Patient / Caregiver provided with ferbal instruction and demonstration regarding donning, doffing, wear, care, proper fit, function, purpose, cleaning, and use of shoes and insoles ' and in all related precautions and risks and benefits regarding shoes and insoles. Patient / Caregiver was instructed to wear properly fitting socks with shoes at all times. Patient was also provided with verbal instruction regarding how to report any failures or malfunctions of shoes or inserts, and necessary follow up care. Patient / Caregiver was also instructed to contact physician regarding change in status that may affect function of shoes and inserts.   Patient / Caregiver verbalized undersatnding of instruction provided. Patient / Caregiver demonstrated independence with proper donning and doffing of shoes and inserts.  PLAN Patient to follow up as needed. Plan of care was discussed with and agreed upon by patient and/or caregiver. All questions were answered and concerns addressed.

## 2021-04-05 ENCOUNTER — Ambulatory Visit: Payer: Medicare Other | Admitting: Podiatry

## 2021-05-04 ENCOUNTER — Emergency Department (HOSPITAL_COMMUNITY)
Admission: EM | Admit: 2021-05-04 | Discharge: 2021-05-05 | Disposition: A | Discharge status: Skilled Nursing Facility | Payer: Medicare Other | Source: Home / Self Care | Attending: Emergency Medicine | Admitting: Emergency Medicine

## 2021-05-04 ENCOUNTER — Other Ambulatory Visit: Payer: Self-pay

## 2021-05-04 DIAGNOSIS — R112 Nausea with vomiting, unspecified: Secondary | ICD-10-CM | POA: Insufficient documentation

## 2021-05-04 DIAGNOSIS — Z79899 Other long term (current) drug therapy: Secondary | ICD-10-CM | POA: Insufficient documentation

## 2021-05-04 DIAGNOSIS — I10 Essential (primary) hypertension: Secondary | ICD-10-CM | POA: Insufficient documentation

## 2021-05-04 DIAGNOSIS — R509 Fever, unspecified: Secondary | ICD-10-CM | POA: Insufficient documentation

## 2021-05-04 DIAGNOSIS — E119 Type 2 diabetes mellitus without complications: Secondary | ICD-10-CM | POA: Insufficient documentation

## 2021-05-04 DIAGNOSIS — J111 Influenza due to unidentified influenza virus with other respiratory manifestations: Secondary | ICD-10-CM

## 2021-05-04 DIAGNOSIS — Z8546 Personal history of malignant neoplasm of prostate: Secondary | ICD-10-CM | POA: Insufficient documentation

## 2021-05-04 DIAGNOSIS — E871 Hypo-osmolality and hyponatremia: Secondary | ICD-10-CM

## 2021-05-04 DIAGNOSIS — R531 Weakness: Secondary | ICD-10-CM | POA: Insufficient documentation

## 2021-05-04 DIAGNOSIS — R519 Headache, unspecified: Secondary | ICD-10-CM

## 2021-05-04 DIAGNOSIS — I615 Nontraumatic intracerebral hemorrhage, intraventricular: Secondary | ICD-10-CM | POA: Diagnosis not present

## 2021-05-04 DIAGNOSIS — Z91040 Latex allergy status: Secondary | ICD-10-CM | POA: Insufficient documentation

## 2021-05-04 DIAGNOSIS — Z20822 Contact with and (suspected) exposure to covid-19: Secondary | ICD-10-CM | POA: Insufficient documentation

## 2021-05-04 MED ORDER — LACTATED RINGERS IV BOLUS
1000.0000 mL | Freq: Once | INTRAVENOUS | Status: AC
  Administered 2021-05-05: 1000 mL via INTRAVENOUS

## 2021-05-04 MED ORDER — PROCHLORPERAZINE EDISYLATE 10 MG/2ML IJ SOLN
10.0000 mg | Freq: Once | INTRAMUSCULAR | Status: AC
  Administered 2021-05-05: 10 mg via INTRAVENOUS
  Filled 2021-05-04: qty 2

## 2021-05-04 NOTE — ED Notes (Signed)
If pt discharge tonight caregiver request call be made to Maryjo(daughter) for pick up

## 2021-05-04 NOTE — ED Triage Notes (Signed)
Patient BIB EMS for evaluation of headache and nausea after receiving the shingles vaccination yesterday.  Reports HA is "mild currently but comes and goes."
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
## 2021-05-04 NOTE — ED Notes (Signed)
Urinal placed at bedside.

## 2021-05-05 LAB — CBC WITH DIFFERENTIAL/PLATELET
Abs Immature Granulocytes: 0.03 10*3/uL (ref 0.00–0.07)
Basophils Absolute: 0 10*3/uL (ref 0.0–0.1)
Basophils Relative: 0 %
Eosinophils Absolute: 0 10*3/uL (ref 0.0–0.5)
Eosinophils Relative: 0 %
HCT: 40.8 % (ref 39.0–52.0)
Hemoglobin: 14.3 g/dL (ref 13.0–17.0)
Immature Granulocytes: 0 %
Lymphocytes Relative: 13 %
Lymphs Abs: 1 10*3/uL (ref 0.7–4.0)
MCH: 35 pg — ABNORMAL HIGH (ref 26.0–34.0)
MCHC: 35 g/dL (ref 30.0–36.0)
MCV: 100 fL (ref 80.0–100.0)
Monocytes Absolute: 0.8 10*3/uL (ref 0.1–1.0)
Monocytes Relative: 10 %
Neutro Abs: 6.4 10*3/uL (ref 1.7–7.7)
Neutrophils Relative %: 77 %
Platelets: 233 10*3/uL (ref 150–400)
RBC: 4.08 MIL/uL — ABNORMAL LOW (ref 4.22–5.81)
RDW: 12.9 % (ref 11.5–15.5)
WBC: 8.3 10*3/uL (ref 4.0–10.5)
nRBC: 0 % (ref 0.0–0.2)

## 2021-05-05 LAB — RESP PANEL BY RT-PCR (FLU A&B, COVID) ARPGX2
Influenza A by PCR: NEGATIVE
Influenza B by PCR: NEGATIVE
SARS Coronavirus 2 by RT PCR: NEGATIVE

## 2021-05-05 LAB — COMPREHENSIVE METABOLIC PANEL
ALT: 18 U/L (ref 0–44)
AST: 18 U/L (ref 15–41)
Albumin: 3.9 g/dL (ref 3.5–5.0)
Alkaline Phosphatase: 39 U/L (ref 38–126)
Anion gap: 9 (ref 5–15)
BUN: 15 mg/dL (ref 8–23)
CO2: 23 mmol/L (ref 22–32)
Calcium: 9 mg/dL (ref 8.9–10.3)
Chloride: 98 mmol/L (ref 98–111)
Creatinine, Ser: 0.85 mg/dL (ref 0.61–1.24)
GFR, Estimated: >60 mL/min (ref >60)
Glucose, Bld: 179 mg/dL — ABNORMAL HIGH (ref 70–99)
Potassium: 3.9 mmol/L (ref 3.5–5.1)
Sodium: 130 mmol/L — ABNORMAL LOW (ref 135–145)
Total Bilirubin: 1.1 mg/dL (ref 0.3–1.2)
Total Protein: 7.1 g/dL (ref 6.5–8.1)

## 2021-05-05 MED ORDER — ACETAMINOPHEN 325 MG PO TABS
650.0000 mg | ORAL_TABLET | Freq: Once | ORAL | Status: AC
Start: 2021-05-05 — End: 2021-05-05
  Administered 2021-05-05: 650 mg via ORAL
  Filled 2021-05-05: qty 2

## 2021-05-05 MED ORDER — KETOROLAC TROMETHAMINE 15 MG/ML IJ SOLN
15.0000 mg | Freq: Once | INTRAMUSCULAR | Status: AC
  Administered 2021-05-05: 15 mg via INTRAVENOUS
  Filled 2021-05-05: qty 1

## 2021-05-05 MED ORDER — ONDANSETRON 8 MG PO TBDP: 8.0000 mg | ORAL_TABLET | Freq: Three times a day (TID) | ORAL | 0 refills | Status: DC | PRN

## 2021-05-05 NOTE — ED Notes (Signed)
Patient assisted back to bed with walker.  States he would like to call a ride for discharge.

## 2021-05-05 NOTE — ED Notes (Signed)
Patient assisted to bathroom with walker

## 2021-05-05 NOTE — Discharge Instructions (Signed)
Drink plenty fluids.  Take acetaminophen and/or ibuprofen as needed for fever or pain.  Return if symptoms are worsening.

## 2021-05-06 ENCOUNTER — Inpatient Hospital Stay (HOSPITAL_BASED_OUTPATIENT_CLINIC_OR_DEPARTMENT_OTHER)
Admission: EM | Admit: 2021-05-06 | Discharge: 2021-05-10 | DRG: 065 | Disposition: A | Discharge status: Skilled Nursing Facility | Payer: Medicare Other | Attending: Neurosurgery | Admitting: Neurosurgery

## 2021-05-06 ENCOUNTER — Emergency Department (HOSPITAL_BASED_OUTPATIENT_CLINIC_OR_DEPARTMENT_OTHER): Payer: Medicare Other

## 2021-05-06 ENCOUNTER — Encounter (HOSPITAL_BASED_OUTPATIENT_CLINIC_OR_DEPARTMENT_OTHER): Payer: Self-pay

## 2021-05-06 DIAGNOSIS — F419 Anxiety disorder, unspecified: Secondary | ICD-10-CM | POA: Diagnosis present

## 2021-05-06 DIAGNOSIS — R296 Repeated falls: Secondary | ICD-10-CM | POA: Diagnosis not present

## 2021-05-06 DIAGNOSIS — H903 Sensorineural hearing loss, bilateral: Secondary | ICD-10-CM | POA: Diagnosis not present

## 2021-05-06 DIAGNOSIS — R509 Fever, unspecified: Secondary | ICD-10-CM | POA: Diagnosis present

## 2021-05-06 DIAGNOSIS — I10 Essential (primary) hypertension: Secondary | ICD-10-CM | POA: Diagnosis not present

## 2021-05-06 DIAGNOSIS — E871 Hypo-osmolality and hyponatremia: Secondary | ICD-10-CM | POA: Diagnosis not present

## 2021-05-06 DIAGNOSIS — Z8546 Personal history of malignant neoplasm of prostate: Secondary | ICD-10-CM | POA: Diagnosis not present

## 2021-05-06 DIAGNOSIS — E559 Vitamin D deficiency, unspecified: Secondary | ICD-10-CM | POA: Diagnosis present

## 2021-05-06 DIAGNOSIS — Z9181 History of falling: Secondary | ICD-10-CM | POA: Diagnosis not present

## 2021-05-06 DIAGNOSIS — Z8249 Family history of ischemic heart disease and other diseases of the circulatory system: Secondary | ICD-10-CM

## 2021-05-06 DIAGNOSIS — Z20822 Contact with and (suspected) exposure to covid-19: Secondary | ICD-10-CM | POA: Diagnosis not present

## 2021-05-06 DIAGNOSIS — N401 Enlarged prostate with lower urinary tract symptoms: Secondary | ICD-10-CM | POA: Diagnosis present

## 2021-05-06 DIAGNOSIS — E785 Hyperlipidemia, unspecified: Secondary | ICD-10-CM | POA: Diagnosis not present

## 2021-05-06 DIAGNOSIS — Z91040 Latex allergy status: Secondary | ICD-10-CM

## 2021-05-06 DIAGNOSIS — R338 Other retention of urine: Secondary | ICD-10-CM | POA: Diagnosis not present

## 2021-05-06 DIAGNOSIS — Z86010 Personal history of colonic polyps: Secondary | ICD-10-CM | POA: Diagnosis not present

## 2021-05-06 DIAGNOSIS — Z9079 Acquired absence of other genital organ(s): Secondary | ICD-10-CM

## 2021-05-06 DIAGNOSIS — F09 Unspecified mental disorder due to known physiological condition: Secondary | ICD-10-CM | POA: Diagnosis present

## 2021-05-06 DIAGNOSIS — E1140 Type 2 diabetes mellitus with diabetic neuropathy, unspecified: Secondary | ICD-10-CM | POA: Diagnosis present

## 2021-05-06 DIAGNOSIS — I615 Nontraumatic intracerebral hemorrhage, intraventricular: Principal | ICD-10-CM

## 2021-05-06 DIAGNOSIS — M436 Torticollis: Secondary | ICD-10-CM | POA: Diagnosis present

## 2021-05-06 DIAGNOSIS — Z7984 Long term (current) use of oral hypoglycemic drugs: Secondary | ICD-10-CM | POA: Diagnosis not present

## 2021-05-06 DIAGNOSIS — Z888 Allergy status to other drugs, medicaments and biological substances status: Secondary | ICD-10-CM | POA: Diagnosis not present

## 2021-05-06 DIAGNOSIS — Z79899 Other long term (current) drug therapy: Secondary | ICD-10-CM

## 2021-05-06 DIAGNOSIS — S06360A Traumatic hemorrhage of cerebrum, unspecified, without loss of consciousness, initial encounter: Secondary | ICD-10-CM

## 2021-05-06 DIAGNOSIS — Z881 Allergy status to other antibiotic agents status: Secondary | ICD-10-CM

## 2021-05-06 HISTORY — DX: Nontraumatic intracerebral hemorrhage, intraventricular: I61.5

## 2021-05-06 LAB — COMPREHENSIVE METABOLIC PANEL
ALT: 13 U/L (ref 0–44)
AST: 17 U/L (ref 15–41)
Albumin: 4.4 g/dL (ref 3.5–5.0)
Alkaline Phosphatase: 35 U/L — ABNORMAL LOW (ref 38–126)
Anion gap: 9 (ref 5–15)
BUN: 14 mg/dL (ref 8–23)
CO2: 28 mmol/L (ref 22–32)
Calcium: 9.4 mg/dL (ref 8.9–10.3)
Chloride: 95 mmol/L — ABNORMAL LOW (ref 98–111)
Creatinine, Ser: 0.9 mg/dL (ref 0.61–1.24)
GFR, Estimated: >60 mL/min (ref >60)
Glucose, Bld: 169 mg/dL — ABNORMAL HIGH (ref 70–99)
Potassium: 4.1 mmol/L (ref 3.5–5.1)
Sodium: 132 mmol/L — ABNORMAL LOW (ref 135–145)
Total Bilirubin: 0.8 mg/dL (ref 0.3–1.2)
Total Protein: 7.6 g/dL (ref 6.5–8.1)

## 2021-05-06 LAB — CBC WITH DIFFERENTIAL/PLATELET
Abs Immature Granulocytes: 0.04 10*3/uL (ref 0.00–0.07)
Basophils Absolute: 0 10*3/uL (ref 0.0–0.1)
Basophils Relative: 0 %
Eosinophils Absolute: 0 10*3/uL (ref 0.0–0.5)
Eosinophils Relative: 0 %
HCT: 41.5 % (ref 39.0–52.0)
Hemoglobin: 14.5 g/dL (ref 13.0–17.0)
Immature Granulocytes: 1 %
Lymphocytes Relative: 16 %
Lymphs Abs: 1.4 10*3/uL (ref 0.7–4.0)
MCH: 34.1 pg — ABNORMAL HIGH (ref 26.0–34.0)
MCHC: 34.9 g/dL (ref 30.0–36.0)
MCV: 97.6 fL (ref 80.0–100.0)
Monocytes Absolute: 1.1 10*3/uL — ABNORMAL HIGH (ref 0.1–1.0)
Monocytes Relative: 13 %
Neutro Abs: 6.2 10*3/uL (ref 1.7–7.7)
Neutrophils Relative %: 70 %
Platelets: 246 10*3/uL (ref 150–400)
RBC: 4.25 MIL/uL (ref 4.22–5.81)
RDW: 12.9 % (ref 11.5–15.5)
WBC: 8.7 10*3/uL (ref 4.0–10.5)
nRBC: 0 % (ref 0.0–0.2)

## 2021-05-06 LAB — PROTIME-INR
INR: 1 (ref 0.8–1.2)
Prothrombin Time: 12.9 seconds (ref 11.4–15.2)

## 2021-05-06 LAB — RESP PANEL BY RT-PCR (FLU A&B, COVID) ARPGX2
Influenza A by PCR: NEGATIVE
Influenza B by PCR: NEGATIVE
SARS Coronavirus 2 by RT PCR: NEGATIVE

## 2021-05-06 LAB — CBG MONITORING, ED: Glucose-Capillary: 157 mg/dL — ABNORMAL HIGH (ref 70–99)

## 2021-05-06 MED ORDER — CYCLOSPORINE 0.05 % OP EMUL
1.0000 [drp] | Freq: Two times a day (BID) | OPHTHALMIC | Status: DC
  Administered 2021-05-07 – 2021-05-10 (×8): 1 [drp] via OPHTHALMIC
  Filled 2021-05-06 (×10): qty 30

## 2021-05-06 MED ORDER — ESCITALOPRAM OXALATE 10 MG PO TABS
5.0000 mg | ORAL_TABLET | Freq: Every morning | ORAL | Status: DC
  Administered 2021-05-07 – 2021-05-10 (×4): 5 mg via ORAL
  Filled 2021-05-06 (×3): qty 1

## 2021-05-06 MED ORDER — IOHEXOL 350 MG/ML SOLN
75.0000 mL | Freq: Once | INTRAVENOUS | Status: AC | PRN
Start: 2021-05-06 — End: 2021-05-06
  Administered 2021-05-06: 75 mL via INTRAVENOUS

## 2021-05-06 MED ORDER — SODIUM CHLORIDE 0.9% FLUSH
3.0000 mL | Freq: Two times a day (BID) | INTRAVENOUS | Status: DC
  Administered 2021-05-06 – 2021-05-10 (×8): 3 mL via INTRAVENOUS
  Filled 2021-05-06: qty 3

## 2021-05-06 MED ORDER — POLYETHYLENE GLYCOL 3350 17 G PO PACK
17.0000 g | PACK | Freq: Every day | ORAL | Status: DC | PRN
  Administered 2021-05-08 – 2021-05-10 (×2): 17 g via ORAL
  Filled 2021-05-06 (×2): qty 1

## 2021-05-06 MED ORDER — SODIUM CHLORIDE 0.9 % IV BOLUS
500.0000 mL | Freq: Once | INTRAVENOUS | Status: AC
Start: 2021-05-06 — End: 2021-05-06
  Administered 2021-05-06: 500 mL via INTRAVENOUS

## 2021-05-06 MED ORDER — CHLORHEXIDINE GLUCONATE CLOTH 2 % EX PADS
6.0000 | MEDICATED_PAD | Freq: Every day | CUTANEOUS | Status: DC
  Administered 2021-05-07 – 2021-05-08 (×2): 6 via TOPICAL

## 2021-05-06 MED ORDER — MORPHINE SULFATE (PF) 4 MG/ML IV SOLN
4.0000 mg | Freq: Once | INTRAVENOUS | Status: AC
  Administered 2021-05-06: 4 mg via INTRAVENOUS
  Filled 2021-05-06: qty 1

## 2021-05-06 MED ORDER — DOCUSATE SODIUM 100 MG PO CAPS
100.0000 mg | ORAL_CAPSULE | Freq: Two times a day (BID) | ORAL | Status: DC
  Administered 2021-05-06 – 2021-05-10 (×8): 100 mg via ORAL
  Filled 2021-05-06 (×8): qty 1

## 2021-05-06 MED ORDER — SITAGLIPTIN PHOS-METFORMIN HCL 50-1000 MG PO TABS: 1.0000 | ORAL_TABLET | Freq: Two times a day (BID) | ORAL | Status: DC

## 2021-05-06 MED ORDER — METOPROLOL TARTRATE 5 MG/5ML IV SOLN: 5.0000 mg | Freq: Four times a day (QID) | INTRAVENOUS | Status: DC | PRN

## 2021-05-06 MED ORDER — ONDANSETRON HCL 4 MG/2ML IJ SOLN
4.0000 mg | Freq: Once | INTRAMUSCULAR | Status: AC
  Administered 2021-05-06: 4 mg via INTRAVENOUS
  Filled 2021-05-06: qty 2

## 2021-05-06 MED ORDER — FLEET ENEMA 7-19 GM/118ML RE ENEM: 1.0000 | ENEMA | Freq: Once | RECTAL | Status: DC | PRN

## 2021-05-06 MED ORDER — ACETAMINOPHEN 325 MG PO TABS
650.0000 mg | ORAL_TABLET | Freq: Four times a day (QID) | ORAL | Status: DC | PRN
  Administered 2021-05-07 – 2021-05-10 (×2): 650 mg via ORAL
  Filled 2021-05-06 (×2): qty 2

## 2021-05-06 MED ORDER — ONDANSETRON HCL 4 MG/2ML IJ SOLN
4.0000 mg | Freq: Four times a day (QID) | INTRAMUSCULAR | Status: DC | PRN
  Administered 2021-05-07 – 2021-05-08 (×3): 4 mg via INTRAVENOUS
  Filled 2021-05-06 (×3): qty 2

## 2021-05-06 MED ORDER — METFORMIN HCL 500 MG PO TABS
1000.0000 mg | ORAL_TABLET | Freq: Two times a day (BID) | ORAL | Status: DC
  Administered 2021-05-08 – 2021-05-10 (×6): 1000 mg via ORAL
  Filled 2021-05-06 (×6): qty 2

## 2021-05-06 MED ORDER — SODIUM CHLORIDE 0.9 % IV SOLN: INTRAVENOUS | Status: DC

## 2021-05-06 MED ORDER — ACETAMINOPHEN 650 MG RE SUPP: 650.0000 mg | Freq: Four times a day (QID) | RECTAL | Status: DC | PRN

## 2021-05-06 MED ORDER — BISACODYL 10 MG RE SUPP
10.0000 mg | Freq: Every day | RECTAL | Status: DC | PRN
  Administered 2021-05-09: 10 mg via RECTAL
  Filled 2021-05-06: qty 1

## 2021-05-06 MED ORDER — ONDANSETRON HCL 4 MG PO TABS: 4.0000 mg | ORAL_TABLET | Freq: Four times a day (QID) | ORAL | Status: DC | PRN

## 2021-05-06 MED ORDER — LINAGLIPTIN 5 MG PO TABS
5.0000 mg | ORAL_TABLET | Freq: Every day | ORAL | Status: DC
  Administered 2021-05-07 – 2021-05-10 (×4): 5 mg via ORAL
  Filled 2021-05-06 (×4): qty 1

## 2021-05-06 NOTE — ED Notes (Signed)
Called [facility] to transport patient to [facility] ICU 4N room 27

## 2021-05-06 NOTE — ED Notes (Signed)
Blood glucose taken 157

## 2021-05-06 NOTE — ED Provider Notes (Signed)
°  Provider Note MRN:  [id number]  Arrival date & time: 05/06/21    ED Course and Medical Decision Making  Assumed care from Dr Dina Rich at shift change.  See not from prior team for complete details, in brief: 87 yo male with hx HTN, HLD, to ED for N/v/ headache, neck stiffness. Mx falls in the past. Worsening HA today, frontal, x3 days or so. Worsening. Dizzy, difficulty with ambulation 2/2 this dizziness. Difficulty keeping his eyes open 2/2 dizziness and headache.   Plan per prior physician imaging, re-assess  Labs are stable.  CTH with mild layering of hyperdense blood within the occipital horns of b/l lateral ventricles.   Will d/w NSGY  Recommend CTA head  CTA is unremarkable recommend hospitalist admission to progressive bed with repeat CT head in the morning.  If CT imaging on repeat is stable likely be able to discharge that time with o/p f/u  D/w NSGY who have reviewed the CTA (Dr Glenford Peers) they will admit pt to ICU at [facility]. SBP goal <160, if becomes elevated favor labatelol or cleviprex to maintain SBP <160.  Pt to be transferred to [facility] ICU under care of NSGY Dr Vella Raring for management of the above. Pt and family agreeable with plan for transfer- pt is stable for transfer.   .Critical Care Performed by: Jeanell Sparrow, DO Authorized by: Jeanell Sparrow, DO   Critical care provider statement:    Critical care time (minutes):  36   Critical care time was exclusive of:  Separately billable procedures and treating other patients   Critical care was necessary to treat or prevent imminent or life-threatening deterioration of the following conditions:  CNS failure or compromise   Critical care was time spent personally by me on the following activities:  Development of treatment plan with patient or surrogate, discussions with consultants, evaluation of patient's response to treatment, examination of patient, ordering and review of laboratory studies, ordering and review of radiographic  studies, ordering and performing treatments and interventions, pulse oximetry, re-evaluation of patient's condition and review of old charts   Care discussed with: admitting provider    Final Clinical Impressions(s) / ED Diagnoses     ICD-10-CM   1. Traumatic hemorrhage of cerebrum without loss of consciousness, unspecified laterality, initial encounter [facility])  T05.697X       ED Discharge Orders     None       Discharge Instructions   None         Jeanell Sparrow, DO 05/06/21 2319

## 2021-05-06 NOTE — ED Notes (Signed)
Patient transported to CT 

## 2021-05-06 NOTE — ED Provider Notes (Signed)
, [facility] EMERGENCY DEPT Provider Note   CSN: [id number] Arrival date & time: 05/06/21  1314     History  Chief Complaint  Patient presents with   Headache    dizziness    Logan Hobbs is a 87 y.o. male.  HPI  87 year old male with past medical history of HTN, DM HLD presents the emergency department with concern for headache, neck pain/stiffness, nausea/vomiting.  Patient has had multiple falls in the past, some with head injury.  He is scheduled to see a neurologist as an outpatient due to headaches, falls and head injuries.  Patient presents today because he has had a worse than baseline headache, frontal.  Started suddenly about 3 days ago, has been progressively worse.  Now associated with neck pain and stiffness.  He has photophobia, nausea/vomiting.  States that he feels dizzy and his family member at bedside says he can no longer walk secondary to being unsteady.  Patient denies being on any anticoagulation.  Denies any numbness/tingling.  Home Medications Prior to Admission medications   Medication Sig Start Date End Date Taking? Authorizing Provider  acetaminophen (TYLENOL) 500 MG tablet Take 1,000 mg by mouth in the morning and at bedtime.   Yes [provider]  Cholecalciferol (D3-1000 PO) Take 2,000 mg by mouth every evening.   Yes [provider]  cycloSPORINE (RESTASIS) 0.05 % ophthalmic emulsion Place 1 drop into both eyes 2 (two) times daily.   Yes [provider]  escitalopram (LEXAPRO) 5 MG tablet Take 5 mg by mouth every morning. 08/26/20  Yes [provider]  losartan (COZAAR) 50 MG tablet Take 50 mg by mouth daily. 02/02/21  Yes [provider]  ondansetron (ZOFRAN-ODT) 8 MG disintegrating tablet Take 1 tablet (8 mg total) by mouth every 8 (eight) hours as needed for nausea or vomiting. 3/87/56   Delora Fuel, MD  rosuvastatin (CRESTOR) 10 MG tablet Take 10 mg by mouth every morning.    [provider]  sitaGLIPtin-metformin (JANUMET) 50-1000 MG tablet Take 1 tablet by mouth 2 (two) times daily. 10/10/17   [provider]  vitamin B-12 (CYANOCOBALAMIN) 1000 MCG tablet Take 1,000 mcg by mouth daily.    [provider]      Allergies    Latex, Ciprofloxacin, Seasonal ic [cholestatin], Solifenacin succinate, and Tamsulosin hcl    Review of Systems   Review of Systems  Constitutional:  Negative for fever.  Eyes:  Positive for photophobia. Negative for visual disturbance.  Respiratory:  Negative for shortness of breath.   Cardiovascular:  Negative for chest pain.  Gastrointestinal:  Positive for nausea and vomiting. Negative for abdominal pain and diarrhea.  Musculoskeletal:  Positive for neck pain and neck stiffness.  Skin:  Negative for rash.  Neurological:  Positive for dizziness and headaches. Negative for syncope, weakness and numbness.   Physical Exam Updated Vital Signs BP (!) 142/111    Pulse 62    Temp 99.1 F (37.3 C) (Oral)    Resp 17    Ht 5' 10.5" (1.791 m)    Wt 92.5 kg    SpO2 98%    BMI 28.86 kg/m  Physical Exam Vitals and nursing note reviewed.  Constitutional:      Appearance: Normal appearance. He is ill-appearing.  HENT:     Head: Normocephalic.     Mouth/Throat:     Mouth: Mucous membranes are moist.  Eyes:     Extraocular Movements: Extraocular movements intact.  Pupils: Pupils are equal, round, and reactive to light.  Cardiovascular:     Rate and Rhythm: Normal rate.  Pulmonary:     Effort: Pulmonary effort is normal. No respiratory distress.  Abdominal:     Palpations: Abdomen is soft.     Tenderness: There is no abdominal tenderness.  Musculoskeletal:     Cervical back: Rigidity present.  Skin:    General: Skin is warm.  Neurological:     Mental Status: He is alert and oriented to person, place, and time. Mental status is at baseline.     Cranial Nerves: No cranial nerve deficit, dysarthria or facial asymmetry.      Sensory: No sensory deficit.    ED Results / Procedures / Treatments   Labs (all labs ordered are listed, but only abnormal results are displayed) Labs Reviewed  CBC WITH DIFFERENTIAL/PLATELET - Abnormal; Notable for the following components:      Result Value   MCH 34.1 (*)    Monocytes Absolute 1.1 (*)    All other components within normal limits  CBG MONITORING, ED - Abnormal; Notable for the following components:   Glucose-Capillary 157 (*)    All other components within normal limits  RESP PANEL BY RT-PCR (FLU A&B, COVID) ARPGX2  COMPREHENSIVE METABOLIC PANEL  PROTIME-INR    EKG None  Radiology No results found.  Procedures Procedures    Medications Ordered in ED Medications  ondansetron (ZOFRAN) injection 4 mg (4 mg Intravenous Given 05/06/21 1457)  morphine 4 MG/ML injection 4 mg (4 mg Intravenous Given 05/06/21 1458)    ED Course/ Medical Decision Making/ A&P                           Medical Decision Making  87 year old male presents emergency department with headache, neck pain/stiffness, dizziness, nausea/vomiting.  Has had multiple falls and head injuries in the past.  This specific headache was more sudden onset about 3 days ago, constant, progressively worse.  Patient appears ill, eyes closed, cannot keep his eyes open secondary to dizziness, dry heaving.  He does have some neck stiffness on exam and pain with range of motion.  He is afebrile.  My biggest concern would be ICH.  Immediate plan for blood work and head CT without.  Given the duration of his symptoms at this is negative we will need to pursue some sort of vascular imaging and possible MRI.  Patient will be treated symptomatically in the meantime.  Patient signed out to Dr. Pearline Cables pending labs/imaging and reevaluation.         Final Clinical Impression(s) / ED Diagnoses Final diagnoses:  None    Rx / DC Orders ED Discharge Orders     None         Lorelle Gibbs,  DO 05/06/21 1511

## 2021-05-06 NOTE — ED Notes (Signed)
Called 4N to see if bed was ready.  Showing assigned since 7:40pm.  Was told bed was ready and its EVS job to change over to ready

## 2021-05-06 NOTE — ED Triage Notes (Signed)
Onset of two days of headache and dizziness.  Feeling weak. No SOB but non productive cough. Denies chest pain

## 2021-05-06 NOTE — H&P (Signed)
Chief Complaint: IVH HPI: Logan Hobbs is a 87 y.o. male with a PmHx significant for HTN, DM2, and HLD presented to [facility] today due to complaints of a progressively worsening headache with an acute onset of approximately 3 days ago. He is unable to report any precipitating events. He also reports some neck pain/stiffness, dizziness, and photophobia. He has a h/o falls in the past with resultant head injury associated with some of the falls. He is scheduled for evaluation by outpatient neurology due to headaches, falls and head injuries. Patient denies any anticoagulation use. He takes an 81mg  aspirin QD and stated he took this this morning as per usual.  He denies numbness/tingling, weakness, visual disturbances, and confusion. Due to the findings on his head CT, neurosurgery was asked to evaluate the patient.   Past Medical History:  Diagnosis Date   Abrasion 02/10/2021   Dec 25, 2020 Entered By: Ree Kida Comment: Status post fall   Allergic rhinitis 02/02/2011   Anxiety 12/13/2012   [facility]; [facility] when necessary.   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 Followed by Dr. Farris Has at [facility].   Cataract 09/18/2020   Cognitive disorder 02/10/2021   Colon polyp 02/13/2012   Dec. 16, 2011 and next one due 5 years   Constipation 09/18/2020   Diabetes mellitus with coincident hypertension ([place]) 03/31/2017   Diabetes mellitus with neuropathy ([place]) 02/14/2013   Encounter for fitting and adjustment of hearing aid 09/18/2020   Essential hypertension 05/16/2018   Gait abnormality 03/15/2015   Followed by Dr. Yolonda Kida with [facility]. He  does have neuropathy. Dr. Audley Hose note 07/03/14 says etiology of apractic gait unclear. Ongoing neurology follow up.   Hearing loss 05/16/2018   History of fall 02/10/2021   Hyperlipidemia 05/16/2018   Impacted cerumen, bilateral 09/18/2020   Lumbar degenerative disc disease 03/15/2015   Mild multilevel degenerative disc changes on MRI of the lumbar spine ordered by Dr. Yolonda Kida, done 06/15/14   Male erectile dysfunction, unspecified 09/13/2016   Malignant neoplasm of prostate ([place]) 10/26/2010   Need for prophylactic vaccination and inoculation against influenza 09/18/2020   Neuropathy 06/20/2014   Obesity 09/18/2020   Onychomycosis 05/16/2018   Osteoarthritis 05/17/2013   Left shoulder   Other abnormalities of gait and mobility 02/10/2021   Pre-operative cardiovascular examination 05/16/2018   Rash and other nonspecific skin eruption 09/18/2020   Ringing in ears 09/18/2020   Senile nuclear sclerosis 01/30/2013   Sensorineural hearing loss, bilateral 09/18/2020   Syncope and collapse 12/21/2020   Tinea cruris 05/16/2018   Tremor 02/10/2021   Trochanteric bursitis of right hip 05/28/2019   Type 2 diabetes mellitus without complications ([place]) 1/94/1740   Vertigo 05/16/2018   Vitamin D insufficiency 05/27/2013    Past Surgical History:  Procedure Laterality Date   CATARACT EXTRACTION     PROSTATE SURGERY  2009   TONSILLECTOMY     WISDOM TOOTH EXTRACTION      Family History  Problem Relation Age of Onset   Heart disease Father    Hypertension Father    Heart disease Brother    Cancer Brother    Diabetes Neg Hx    Social History:  reports that  he has never smoked. He has never used smokeless tobacco. He reports current alcohol use. He reports that he does not use drugs.  Allergies:  Allergies  Allergen Reactions   Latex Shortness Of Breath    Skin itching and breathing problems Other reaction(s): rash   Ciprofloxacin Other (See Comments)    constipation   Seasonal Ic [Cholestatin] Other (See  Comments)    Unknown reaction   Solifenacin Succinate Other (See Comments)    constipation   Tamsulosin Hcl     Weakness, vertigo, and nausea    (Not in a hospital admission)   Results for orders placed or performed during the hospital encounter of 05/06/21 (from the past 48 hour(s))  CBG monitoring, ED     Status: Abnormal   Collection Time: 05/06/21  2:17 PM  Result Value Ref Range   Glucose-Capillary 157 (H) 70 - 99 mg/dL    Comment: Glucose reference range applies only to samples taken after fasting for at least 8 hours.  CBC with Differential     Status: Abnormal   Collection Time: 05/06/21  2:56 PM  Result Value Ref Range   WBC 8.7 4.0 - 10.5 K/uL   RBC 4.25 4.22 - 5.81 MIL/uL   Hemoglobin 14.5 13.0 - 17.0 g/dL   HCT 41.5 39.0 - 52.0 %   MCV 97.6 80.0 - 100.0 fL   MCH 34.1 (H) 26.0 - 34.0 pg   MCHC 34.9 30.0 - 36.0 g/dL   RDW 12.9 11.5 - 15.5 %   Platelets 246 150 - 400 K/uL   nRBC 0.0 0.0 - 0.2 %   Neutrophils Relative % 70 %   Neutro Abs 6.2 1.7 - 7.7 K/uL   Lymphocytes Relative 16 %   Lymphs Abs 1.4 0.7 - 4.0 K/uL   Monocytes Relative 13 %   Monocytes Absolute 1.1 (H) 0.1 - 1.0 K/uL   Eosinophils Relative 0 %   Eosinophils Absolute 0.0 0.0 - 0.5 K/uL   Basophils Relative 0 %   Basophils Absolute 0.0 0.0 - 0.1 K/uL   Immature Granulocytes 1 %   Abs Immature Granulocytes 0.04 0.00 - 0.07 K/uL    Comment: Performed at [facility], [place], [place] [address]  Comprehensive metabolic panel     Status: Abnormal   Collection Time: 05/06/21  2:56 PM  Result Value Ref Range   Sodium 132 (L) 135 - 145 mmol/L   Potassium 4.1 3.5 - 5.1 mmol/L   Chloride 95 (L) 98 - 111 mmol/L   CO2 28 22 - 32 mmol/L   Glucose, Bld 169 (H) 70 - 99 mg/dL    Comment: Glucose reference range applies only to samples taken after fasting for at least 8 hours.   BUN 14 8 - 23 mg/dL   Creatinine, Ser 0.90 0.61 - 1.24 mg/dL   Calcium 9.4 8.9 - 10.3 mg/dL    Total Protein 7.6 6.5 - 8.1 g/dL   Albumin 4.4 3.5 - 5.0 g/dL   AST 17 15 - 41 U/L   ALT 13 0 - 44 U/L   Alkaline Phosphatase 35 (L) 38 - 126 U/L   Total Bilirubin 0.8 0.3 - 1.2 mg/dL   GFR, Estimated >60 >60 mL/min    Comment: (NOTE) Calculated using the CKD-EPI Creatinine Equation (2021)    Anion gap 9 5 - 15    Comment: Performed at [facility], [address], [place], [place] [address]  Protime-INR     Status:  None   Collection Time: 05/06/21  2:56 PM  Result Value Ref Range   Prothrombin Time 12.9 11.4 - 15.2 seconds   INR 1.0 0.8 - 1.2    Comment: (NOTE) INR goal varies based on device and disease states. Performed at [facility], [address] [place], [place] [address]   Resp Panel by RT-PCR (Flu A&B, Covid) Nasopharyngeal Swab     Status: None   Collection Time: 05/06/21  3:05 PM   Specimen: Nasopharyngeal Swab; Nasopharyngeal(NP) swabs in vial transport medium  Result Value Ref Range   SARS Coronavirus 2 by RT PCR NEGATIVE NEGATIVE    Comment: (NOTE) SARS-CoV-2 target nucleic acids are NOT DETECTED.  The SARS-CoV-2 RNA is generally detectable in upper respiratory specimens during the acute phase of infection. The lowest concentration of SARS-CoV-2 viral copies this assay can detect is 138 copies/mL. A negative result does not preclude SARS-Cov-2 infection and should not be used as the sole basis for treatment or other patient management decisions. A negative result may occur with  improper specimen collection/handling, submission of specimen other than nasopharyngeal swab, presence of viral mutation(s) within the areas targeted by this assay, and inadequate number of viral copies(<138 copies/mL). A negative result must be combined with clinical observations, patient history, and epidemiological information. The expected result is Negative.  Fact Sheet for Patients:  EntrepreneurPulse.com.au  Fact  Sheet for Healthcare Providers:  IncredibleEmployment.be  This test is no t yet approved or cleared by the [place] FDA and  has been authorized for detection and/or diagnosis of SARS-CoV-2 by FDA under an Emergency Use Authorization (EUA). This EUA will remain  in effect (meaning this test can be used) for the duration of the COVID-19 declaration under Section 564(b)(1) of the Act, 21 U.S.C.section 360bbb-3(b)(1), unless the authorization is terminated  or revoked sooner.       Influenza A by PCR NEGATIVE NEGATIVE   Influenza B by PCR NEGATIVE NEGATIVE    Comment: (NOTE) The Xpert Xpress SARS-CoV-2/FLU/RSV plus assay is intended as an aid in the diagnosis of influenza from Nasopharyngeal swab specimens and should not be used as a sole basis for treatment. Nasal washings and aspirates are unacceptable for Xpert Xpress SARS-CoV-2/FLU/RSV testing.  Fact Sheet for Patients: EntrepreneurPulse.com.au  Fact Sheet for Healthcare Providers: IncredibleEmployment.be  This test is not yet approved or cleared by the [place] FDA and has been authorized for detection and/or diagnosis of SARS-CoV-2 by FDA under an Emergency Use Authorization (EUA). This EUA will remain in effect (meaning this test can be used) for the duration of the COVID-19 declaration under Section 564(b)(1) of the Act, 21 U.S.C. section 360bbb-3(b)(1), unless the authorization is terminated or revoked.  Performed at [facility], [address] [place],  [address]    CT Head Wo Contrast  Result Date: 05/06/2021 CLINICAL DATA:  Headache, new or worsening. EXAM: CT HEAD WITHOUT CONTRAST TECHNIQUE: Contiguous axial images were obtained from the base of the skull through the vertex without intravenous contrast. RADIATION DOSE REDUCTION: This exam was performed according to the departmental dose-optimization program which includes  automated exposure control, adjustment of the mA and/or kV according to patient size and/or use of iterative reconstruction technique. COMPARISON:  CT brain 12/21/2020 FINDINGS: Brain: There is mild-to-moderate cortical atrophy, unchanged from prior and within normal limits for patient age. The ventricles are normal in configuration. The basilar cisterns are patent. No mass, mass effect, or midline shift. There is mild layering hyperdense blood within  the dependent aspect of the occipital horns. No intraparenchymal hematoma is seen. No abnormal extra-axial fluid collection. Preservation of the normal cortical gray-white interface without CT evidence of an acute major vascular territorial cortical based infarction. Vascular: No hyperdense vessel or unexpected calcification. Skull: Normal. Negative for fracture or focal lesion. Sinuses/Orbits: Status post bilateral lens replacements. Mild ethmoid air cell mucosal opacification. Within the limitations of patient motion artifact, no definite calvarial fracture is seen Other: None. IMPRESSION:: IMPRESSION: There is mild layering hyperdense blood within the occipital horns of the bilateral lateral ventricles. This is new compared to 12/21/2020 prior CT. No definite intraparenchymal, subdural, or epidural hematoma is visualized. Critical Value/emergent results were called by telephone at the time of interpretation on 05/06/2021 at 3:53 pm to provider [facility] , who verbally acknowledged these results. Electronically Signed   By: Yvonne Kendall   On: 05/06/2021 15:57    ROS: Per HPI  Blood pressure (!) 147/74, pulse (!) 57, temperature 99.1 F (37.3 C), resp. rate 14, height 5' 10.5" (1.791 m), weight 92.5 kg, SpO2 96 %.  Physical Exam: Patient is awake, A/O X 4, conversant, and thought content appropriate. VSS. Speech is fluent and appropriate without evidence of aphasia. No dysarthria present. Able to follow 3 step commands without difficulty. MAEW with good  strength that is symmetric bilaterally. 5/5 BUE/BLE. No drift present. Sensation to light touch is intact. PERLA, EOMI. CNs grossly intact.   Assessment/Plan 87 year old male with an acute onset of a progressively worsening headache that began approximately three days ago. He also some associated dizziness, neck stiffness, and photophobia. On examination, he has full strength in his BUE and BLE with intact sensation and CNs. CT head was obtained and revealed mild layering of hyperdense blood within the occipital horns of the bilateral lateral ventricles. There is no evidence of IPH, SDH, or SAH. IVH is favored to be secondary to hypertension. The patient does not require any acute neurosurgical intervention at this time. Will obtain a CTA head and neck. Plan to admit to the neuro ICU for frequent neuro checks and close neurological monitoring. Will repeat CT head in the morning for stability purposes. If the patient's repeat CT head is stable, he will likely be able to discharge tomorrow.    -Admit to neuro ICU -[place] SBP < 160 -Frequent neuro checks -CT head in the morning  -Hold aspirin   Marvis Moeller, DNP, NP-C 05/06/2021 7:21 PM

## 2021-05-07 ENCOUNTER — Observation Stay (HOSPITAL_COMMUNITY): Payer: Medicare Other

## 2021-05-07 DIAGNOSIS — M436 Torticollis: Secondary | ICD-10-CM | POA: Diagnosis present

## 2021-05-07 DIAGNOSIS — E785 Hyperlipidemia, unspecified: Secondary | ICD-10-CM | POA: Diagnosis present

## 2021-05-07 DIAGNOSIS — Z881 Allergy status to other antibiotic agents status: Secondary | ICD-10-CM | POA: Diagnosis not present

## 2021-05-07 DIAGNOSIS — Z86010 Personal history of colonic polyps: Secondary | ICD-10-CM | POA: Diagnosis not present

## 2021-05-07 DIAGNOSIS — I10 Essential (primary) hypertension: Secondary | ICD-10-CM | POA: Diagnosis present

## 2021-05-07 DIAGNOSIS — Z91040 Latex allergy status: Secondary | ICD-10-CM | POA: Diagnosis not present

## 2021-05-07 DIAGNOSIS — R338 Other retention of urine: Secondary | ICD-10-CM | POA: Diagnosis present

## 2021-05-07 DIAGNOSIS — H903 Sensorineural hearing loss, bilateral: Secondary | ICD-10-CM | POA: Diagnosis present

## 2021-05-07 DIAGNOSIS — N401 Enlarged prostate with lower urinary tract symptoms: Secondary | ICD-10-CM | POA: Diagnosis present

## 2021-05-07 DIAGNOSIS — I615 Nontraumatic intracerebral hemorrhage, intraventricular: Secondary | ICD-10-CM | POA: Diagnosis present

## 2021-05-07 DIAGNOSIS — F419 Anxiety disorder, unspecified: Secondary | ICD-10-CM | POA: Diagnosis present

## 2021-05-07 DIAGNOSIS — F09 Unspecified mental disorder due to known physiological condition: Secondary | ICD-10-CM | POA: Diagnosis present

## 2021-05-07 DIAGNOSIS — Z8249 Family history of ischemic heart disease and other diseases of the circulatory system: Secondary | ICD-10-CM | POA: Diagnosis not present

## 2021-05-07 DIAGNOSIS — Z7984 Long term (current) use of oral hypoglycemic drugs: Secondary | ICD-10-CM | POA: Diagnosis not present

## 2021-05-07 DIAGNOSIS — Z9079 Acquired absence of other genital organ(s): Secondary | ICD-10-CM | POA: Diagnosis not present

## 2021-05-07 DIAGNOSIS — E871 Hypo-osmolality and hyponatremia: Secondary | ICD-10-CM | POA: Diagnosis present

## 2021-05-07 DIAGNOSIS — E1140 Type 2 diabetes mellitus with diabetic neuropathy, unspecified: Secondary | ICD-10-CM | POA: Diagnosis present

## 2021-05-07 DIAGNOSIS — R509 Fever, unspecified: Secondary | ICD-10-CM | POA: Diagnosis present

## 2021-05-07 DIAGNOSIS — Z8546 Personal history of malignant neoplasm of prostate: Secondary | ICD-10-CM | POA: Diagnosis not present

## 2021-05-07 DIAGNOSIS — Z9181 History of falling: Secondary | ICD-10-CM | POA: Diagnosis not present

## 2021-05-07 DIAGNOSIS — E559 Vitamin D deficiency, unspecified: Secondary | ICD-10-CM | POA: Diagnosis present

## 2021-05-07 DIAGNOSIS — Z20822 Contact with and (suspected) exposure to covid-19: Secondary | ICD-10-CM | POA: Diagnosis present

## 2021-05-07 DIAGNOSIS — R296 Repeated falls: Secondary | ICD-10-CM | POA: Diagnosis present

## 2021-05-07 DIAGNOSIS — Z888 Allergy status to other drugs, medicaments and biological substances status: Secondary | ICD-10-CM | POA: Diagnosis not present

## 2021-05-07 LAB — GLUCOSE, CAPILLARY
Glucose-Capillary: 177 mg/dL — ABNORMAL HIGH (ref 70–99)
Glucose-Capillary: 187 mg/dL — ABNORMAL HIGH (ref 70–99)
Glucose-Capillary: 166 mg/dL — ABNORMAL HIGH (ref 70–99)

## 2021-05-07 LAB — MRSA NEXT GEN BY PCR, NASAL: MRSA by PCR Next Gen: NOT DETECTED

## 2021-05-07 MED ORDER — ACETAMINOPHEN-CODEINE #3 300-30 MG PO TABS
1.0000 | ORAL_TABLET | Freq: Four times a day (QID) | ORAL | Status: DC | PRN
  Administered 2021-05-07 – 2021-05-08 (×2): 1 via ORAL
  Administered 2021-05-09: 2 via ORAL
  Administered 2021-05-09 – 2021-05-10 (×2): 1 via ORAL
  Filled 2021-05-07: qty 2
  Filled 2021-05-07 (×3): qty 1

## 2021-05-07 MED ORDER — INSULIN ASPART 100 UNIT/ML IJ SOLN
0.0000 [IU] | Freq: Three times a day (TID) | INTRAMUSCULAR | Status: DC
  Administered 2021-05-07 (×2): 2 [IU] via SUBCUTANEOUS
  Administered 2021-05-08: 1 [IU] via SUBCUTANEOUS
  Administered 2021-05-08 (×2): 2 [IU] via SUBCUTANEOUS
  Administered 2021-05-09 (×2): 1 [IU] via SUBCUTANEOUS
  Administered 2021-05-09: 3 [IU] via SUBCUTANEOUS
  Administered 2021-05-10: 1 [IU] via SUBCUTANEOUS
  Administered 2021-05-10: 2 [IU] via SUBCUTANEOUS

## 2021-05-07 MED ORDER — MORPHINE SULFATE (PF) 2 MG/ML IV SOLN
2.0000 mg | INTRAVENOUS | Status: DC | PRN
  Administered 2021-05-07 – 2021-05-08 (×5): 2 mg via INTRAVENOUS
  Filled 2021-05-07 (×5): qty 1

## 2021-05-07 MED ORDER — ROSUVASTATIN CALCIUM 5 MG PO TABS
10.0000 mg | ORAL_TABLET | Freq: Every morning | ORAL | Status: DC
  Administered 2021-05-07 – 2021-05-10 (×4): 10 mg via ORAL
  Filled 2021-05-07 (×3): qty 2

## 2021-05-07 MED ORDER — ACETAMINOPHEN-CODEINE #3 300-30 MG PO TABS
1.0000 | ORAL_TABLET | ORAL | Status: DC | PRN
  Administered 2021-05-07: 1 via ORAL
  Filled 2021-05-07: qty 1

## 2021-05-07 MED ORDER — LOSARTAN POTASSIUM 50 MG PO TABS
50.0000 mg | ORAL_TABLET | Freq: Every day | ORAL | Status: DC
  Administered 2021-05-07 – 2021-05-10 (×4): 50 mg via ORAL
  Filled 2021-05-07 (×4): qty 1

## 2021-05-07 NOTE — TOC Initial Note (Addendum)
Transition of Care [facility]) - Initial/Assessment Note    Patient Details  Name: Logan Hobbs MRN: [id number] Date of Birth: 1934/10/23  Transition of Care [facility]) CM/SW Contact:    Benard Halsted, LCSW Phone Number: 05/07/2021, 3:49 PM  Clinical Narrative:                 CSW received request from patient's spouse to speak with patient's daughter. CSW spoke with patient's daughter. They are requesting SNF placement for patient at discharge. They do not want [facility]. 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 Awaiting therapy evaluations.   CSW spoke with [facility]. They are not certain, but may have a bed Monday. Will continue to review for bed offers.   Expected Discharge Plan: Skilled Nursing Facility Barriers to Discharge: Continued Medical Work up, SNF Pending bed offer   Patient Goals and CMS Choice Patient states their goals for this hospitalization and ongoing recovery are:: Rehab CMS Medicare.gov Compare Post Acute Care list provided to:: Patient Represenative (must comment) Choice offered to / list presented to : Adult Children, Patient  Expected Discharge Plan and Services Expected Discharge Plan: [facility] In-house Referral: Clinical Social Work   Post Acute Care Choice: [place] Living arrangements for the past 2 months: [place]                                      Prior Living Arrangements/Services Living arrangements for the past 2 months: [place] Lives with:: Spouse Patient language and need for interpreter reviewed:: Yes Do you feel safe going back to the place where you live?: Yes      Need for Family Participation in Patient Care: Yes (Comment) Care giver support system in place?: Yes (comment) Current home services: Homehealth aide Criminal Activity/Legal Involvement Pertinent to Current Situation/Hospitalization: No - Comment as needed  Activities of Daily  Living      Permission Sought/Granted Permission sought to share information with : Facility Sport and exercise psychologist, Family Supports Permission granted to share information with : Yes, Verbal Permission Granted  Share Information with NAME: [facility]  Permission granted to share info w AGENCY: SNFs  Permission granted to share info w Relationship: Daughter  Permission granted to share info w Contact Information: [phone]  Emotional Assessment Appearance:: Appears stated age Attitude/Demeanor/Rapport: Engaged Affect (typically observed): Accepting, Appropriate Orientation: : Oriented to Self, Oriented to Place, Oriented to  Time, Oriented to Situation Alcohol / Substance Use: Not Applicable Psych Involvement: No (comment)  Admission diagnosis:  Intraventricular hemorrhage (HCC) [I61.5] Traumatic hemorrhage of cerebrum without loss of consciousness, unspecified laterality, initial encounter ([place]) [S06.360A] Patient Active Problem List   Diagnosis Date Noted   Intraventricular hemorrhage ([place]) 05/06/2021   Allergic rhinitis due to pollen 02/26/2021   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   Atherosclerosis of both carotid arteries 02/26/2021   Chronic idiopathic constipation 02/26/2021   Diabetic peripheral neuropathy associated with type 2 diabetes mellitus ([place]) 02/26/2021   Gout 02/26/2021   Hardening of the aorta (main artery of the heart) ([place]) 02/26/2021   History of malignant neoplasm of prostate 02/26/2021   Pure hypercholesterolemia 02/26/2021   Recurrent falls 02/26/2021   Abrasion 02/10/2021   Cognitive disorder 02/10/2021   History of fall 02/10/2021   Other abnormalities of gait and mobility 02/10/2021   Tremor 02/10/2021   Syncope and collapse 12/21/2020   Bilateral  congenital hammer toes 12/02/2020   Cataract 09/18/2020   Constipation 09/18/2020   Encounter for fitting and adjustment of hearing aid 09/18/2020   Impacted cerumen, bilateral 09/18/2020    Need for prophylactic vaccination and inoculation against influenza 09/18/2020   Obesity 09/18/2020   Rash and other nonspecific skin eruption 09/18/2020   Ringing in ears 09/18/2020   Sensorineural hearing loss, bilateral 09/18/2020   Type 2 diabetes mellitus without complications ([place]) 79/39/0300   Trochanteric bursitis of right hip 05/28/2019   Cancer ([place]) 05/16/2018   Hearing loss 05/16/2018   Hyperlipidemia 05/16/2018   Onychomycosis 05/16/2018   Tinea cruris 05/16/2018   Vertigo 05/16/2018   Pre-operative cardiovascular examination 05/16/2018   Essential hypertension 05/16/2018   Diabetes mellitus with coincident hypertension ([place]) 03/31/2017   Blood pressure alteration 12/15/2016   Benign localized prostatic hyperplasia without lower urinary tract symptoms (LUTS) 09/13/2016   Male erectile dysfunction, unspecified 09/13/2016   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/04/2016   Gait abnormality 03/15/2015   Lumbar degenerative disc disease 03/15/2015   Neuropathy 06/20/2014   Vitamin D insufficiency 05/27/2013   Osteoarthritis 05/17/2013   Diabetes mellitus with neuropathy ([place]) 02/14/2013   Senile nuclear sclerosis 01/30/2013   Anxiety 12/13/2012   Colon polyp 02/13/2012   Allergic rhinitis 02/02/2011   Malignant neoplasm of prostate ([place]) 10/26/2010   PCP:  Marda Stalker, PA-C Pharmacy:   [id number] [place], [place] [address] [place] [address] Phone: [phone] Fax: [phone]  [facility] [place] [place] [facility] [address] [address] [place] [address] Phone: [phone] Fax: [phone]     Social Determinants of Health (SDOH) Interventions    Readmission Risk Interventions No flowsheet data found.

## 2021-05-07 NOTE — NC FL2 (Signed)
Divide LEVEL OF CARE SCREENING TOOL     IDENTIFICATION  Patient Name: Logan Hobbs Birthdate: [birth year] Sex: male Admission Date (Current Location): 05/06/2021  [facility] and [place] Number:  Herbalist and Address:  The . 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   Atherosclerosis of both carotid arteries 02/26/2021   Chronic idiopathic constipation 02/26/2021   Diabetic peripheral neuropathy associated with type 2 diabetes mellitus ([place]) 02/26/2021   Gout 02/26/2021   Hardening of the aorta (main artery of the heart) ([place]) 02/26/2021   History of malignant neoplasm of prostate 02/26/2021   Pure hypercholesterolemia 02/26/2021   Recurrent falls 02/26/2021   Abrasion 02/10/2021   Cognitive disorder 02/10/2021   History of fall 02/10/2021   Other abnormalities of gait and mobility 02/10/2021   Tremor 02/10/2021   Syncope and collapse 12/21/2020   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   Cataract 09/18/2020   Constipation 09/18/2020   Encounter for fitting and adjustment of hearing aid 09/18/2020   Impacted cerumen, bilateral 09/18/2020   Need for prophylactic vaccination and inoculation against influenza 09/18/2020   Obesity 09/18/2020   Rash and other nonspecific skin eruption 09/18/2020   Ringing in ears 09/18/2020   Sensorineural hearing  loss, bilateral 09/18/2020   Type 2 diabetes mellitus without complications ([place]) 84/69/6295   Trochanteric bursitis of right hip 05/28/2019   Cancer ([place]) 05/16/2018   Hearing loss 05/16/2018   Hyperlipidemia 05/16/2018   Onychomycosis 05/16/2018   Tinea cruris 05/16/2018   Vertigo 05/16/2018   Pre-operative cardiovascular examination 05/16/2018   Essential hypertension 05/16/2018   Diabetes mellitus with coincident hypertension ([place]) 03/31/2017   Blood pressure alteration 12/15/2016   Benign localized prostatic hyperplasia without lower urinary tract symptoms (LUTS) 09/13/2016   Male erectile dysfunction, unspecified 09/13/2016   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/04/2016   Gait abnormality 03/15/2015   Lumbar degenerative disc disease 03/15/2015   Neuropathy 06/20/2014   Vitamin D insufficiency 05/27/2013   Osteoarthritis 05/17/2013   Diabetes mellitus with neuropathy ([place]) 02/14/2013   Senile nuclear sclerosis 01/30/2013   Anxiety 12/13/2012   Colon polyp 02/13/2012   Allergic rhinitis 02/02/2011   Malignant neoplasm of prostate ([place]) 10/26/2010    Orientation RESPIRATION BLADDER Height & Weight     Self, Time, Situation, Place  Normal Continent Weight: 204 lb (92.5 kg) Height:  5' 10.5" (179.1 cm)  BEHAVIORAL SYMPTOMS/MOOD NEUROLOGICAL BOWEL NUTRITION STATUS      Continent Diet (See DC summary)  AMBULATORY STATUS COMMUNICATION OF NEEDS Skin   Limited Assist Verbally Normal                       Personal Care Assistance Level of Assistance  Bathing, Feeding, Dressing Bathing Assistance: Limited assistance Feeding assistance: Independent Dressing Assistance: Limited assistance     Functional Limitations Info  Hearing, Speech, Sight Sight Info: Adequate Hearing Info: Adequate  Speech Info: Adequate    SPECIAL CARE FACTORS FREQUENCY  PT (By licensed PT), OT (By licensed OT)     PT Frequency: 5x/week OT Frequency: 5x/week            Contractures  Contractures Info: Not present    Additional Factors Info  Code Status, Allergies, Insulin Sliding Scale Code Status Info: Full Code Allergies Info: Latex, Ciprofloxacin, Seasonal Ic (Cholestatin), Solifenacin Succinate, Tamsulosin Hcl   Insulin Sliding Scale Info: insulin aspart (novoLOG) injection 0-9 Units       Current Medications (05/07/2021):  This is the current hospital active medication list Current Facility-Administered Medications  Medication Dose Route Frequency Provider Last Rate Last Admin   0.9 %  sodium chloride infusion   Intravenous Continuous Vallarie Mare, MD 75 mL/hr at 05/07/21 1400 Infusion Verify at 05/07/21 1400   acetaminophen (TYLENOL) tablet 650 mg  650 mg Oral Q6H PRN Vallarie Mare, MD   650 mg at 05/07/21 0981   Or   acetaminophen (TYLENOL) suppository 650 mg  650 mg Rectal Q6H PRN Vallarie Mare, MD       acetaminophen-codeine (TYLENOL #3) 300-30 MG per tablet 1-2 tablet  1-2 tablet Oral Q6H PRN Vallarie Mare, MD   1 tablet at 05/07/21 1155   bisacodyl (DULCOLAX) suppository 10 mg  10 mg Rectal Daily PRN Vallarie Mare, MD       Chlorhexidine Gluconate Cloth 2 % PADS 6 each  6 each Topical Q0600 Vallarie Mare, MD   6 each at 05/07/21 0554   cycloSPORINE (RESTASIS) 0.05 % ophthalmic emulsion 1 drop  1 drop Both Eyes BID Vallarie Mare, MD   1 drop at 05/07/21 0906   docusate sodium (COLACE) capsule 100 mg  100 mg Oral BID Vallarie Mare, MD   100 mg at 05/07/21 0906   escitalopram (LEXAPRO) tablet 5 mg  5 mg Oral q morning Vallarie Mare, MD   5 mg at 05/07/21 0910   insulin aspart (novoLOG) injection 0-9 Units  0-9 Units Subcutaneous TID WC Vallarie Mare, MD   2 Units at 05/07/21 1143   linagliptin (TRADJENTA) tablet 5 mg  5 mg Oral Daily Vallarie Mare, MD   5 mg at 05/07/21 0906   losartan (COZAAR) tablet 50 mg  50 mg Oral Daily Vallarie Mare, MD   50 mg at 05/07/21 0906   [START ON 05/08/2021] metFORMIN  (GLUCOPHAGE) tablet 1,000 mg  1,000 mg Oral BID WC Vallarie Mare, MD       metoprolol tartrate (LOPRESSOR) injection 5-10 mg  5-10 mg Intravenous Q6H PRN Vallarie Mare, MD       morphine 2 MG/ML injection 2 mg  2 mg Intravenous Q3H PRN Vallarie Mare, MD   2 mg at 05/07/21 1427   ondansetron (ZOFRAN) tablet 4 mg  4 mg Oral Q6H PRN Vallarie Mare, MD       Or   ondansetron [facility]) injection 4 mg  4 mg Intravenous Q6H PRN Vallarie Mare, MD   4 mg at 05/07/21 1914   polyethylene glycol (MIRALAX / GLYCOLAX) packet 17 g  17 g Oral Daily PRN Vallarie Mare, MD       rosuvastatin (CRESTOR) tablet 10 mg  10 mg Oral q morning Vallarie Mare, MD   10 mg at 05/07/21 0910   sodium chloride flush (NS) 0.9 % injection 3 mL  3 mL Intravenous Q12H Vallarie Mare,  MD   3 mL at 05/07/21 0900   sodium phosphate (FLEET) 7-19 GM/118ML enema 1 enema  1 enema Rectal Once PRN Vallarie Mare, MD         Discharge Medications: Please see discharge summary for a list of discharge medications.  Relevant Imaging Results:  Relevant Lab Results:   Additional Information SSN: [id number]. Moderna COVID-19 Vaccine 05/31/2019 , 05/03/2019  Benard Halsted, LCSW

## 2021-05-07 NOTE — Evaluation (Signed)
Physical Therapy Evaluation Patient Details Name: Logan Hobbs MRN: [id number] DOB: December 15, 1934 Today's Date: 05/07/2021  History of Present Illness  87 yo male presents to [facility] on 1/10 with headache, nausea after receiving shingles vaccine yesterday. Pt d/c, back on 1/12 with headache and dizziness. CT revealed mild layering of hyperdense blood within occipital horns of bilat lateral ventricles, no IPH/SDH/SAH; IVH favored secondary to HTN. PMH includes multiple falls with + head trauma, hypertension, diabetes, hyperlipidemia, prostate cancer, anxiety.  Clinical Impression   Pt presents with generalized weakness, severe headache-type pain, ataxic stepping but WFL heel-to-shin bilat, dysmetria as assessed via finger-to-nose worse L vs R especially to pt's L, possible L upper visual field impairment, impaired cognition, impaired balance with high risk of falls, and decreased activity tolerance vs baseline. Pt to benefit from acute PT to address deficits. Pt overall requiring mod +2 for safe short-distance gait, x2 posterior LOB requiring PT intervention to correct. At baseline, pt reports walking mod I with RW and performing all ADLs for self. PT to progress mobility as tolerated, and will continue to follow acutely.         Recommendations for follow up therapy are one component of a multi-disciplinary discharge planning process, led by the attending physician.  Recommendations may be updated based on patient status, additional functional criteria and insurance authorization.  Follow Up Recommendations Skilled nursing-short term rehab (<3 hours/day)    Assistance Recommended at Discharge Frequent or constant Supervision/Assistance  Patient can return home with the following  A lot of help with walking and/or transfers;A lot of help with bathing/dressing/bathroom;Direct supervision/assist for financial management;Assist for transportation;Direct supervision/assist for medications  management;Assistance with cooking/housework    Equipment Recommendations None recommended by PT  Recommendations for Other Services       Functional Status Assessment Patient has had a recent decline in their functional status and/or demonstrates limited ability to make significant improvements in function in a reasonable and predictable amount of time     Precautions / Restrictions Precautions Precautions: Fall Restrictions Weight Bearing Restrictions: No      Mobility  Bed Mobility Overal bed mobility: Needs Assistance Bed Mobility: Supine to Sit     Supine to sit: HOB elevated;Mod assist     General bed mobility comments: assist for trunk elevation and LE progression to EOB, increased time and step-by-step cuing for sequencing.    Transfers Overall transfer level: Needs assistance Equipment used: Rolling walker (2 wheels) Transfers: Sit to/from Stand Sit to Stand: Mod assist           General transfer comment: assist for power up, rise, steadying. Cues for hand placement when rising.    Ambulation/Gait Ambulation/Gait assistance: Mod assist;+2 safety/equipment Gait Distance (Feet): 4 Feet Assistive device: Rolling walker (2 wheels) Gait Pattern/deviations: Step-through pattern;Ataxic;Shuffle;Narrow base of support Gait velocity: decr     General Gait Details: Assist to steady, correct posterior LOB x2, cues for upright posture, taking bigger steps. Ataxic stepping laterally  Stairs            Wheelchair Mobility    Modified Rankin (Stroke Patients Only) Modified Rankin (Stroke Patients Only) Pre-Morbid Rankin Score: Moderate disability Modified Rankin: Moderately severe disability     Balance Overall balance assessment: Needs assistance;History of Falls Sitting-balance support: No upper extremity supported;Feet supported Sitting balance-Leahy Scale: Fair Sitting balance - Comments: Preference for R lateral leaning, cues for upright posture  which pt corrected Postural control: Right lateral lean Standing balance support: Bilateral upper extremity supported;During functional activity  Standing balance-Leahy Scale: Poor Standing balance comment: heavy reliance on RW and PT, LOB requiring PT intervention x2                             Pertinent Vitals/Pain Pain Assessment: 0-10 Pain Score: 10-Worst pain ever Pain Location: head (frontal region), back of neck Pain Descriptors / Indicators: Sore;Headache Pain Intervention(s): Limited activity within patient's tolerance;Monitored during session;Repositioned    Home Living Family/patient expects to be discharged to:: Private residence Living Arrangements: Spouse/significant other Available Help at Discharge: Family Type of Home: Independent living facility ([facility]) Home Access: Level entry       Home Layout: One level Home Equipment: Conservation officer, nature (2 wheels);Wheelchair - Banker;Shower seat      Prior Function Prior Level of Function : Needs assist;Patient poor historian/Family not available             Mobility Comments: pt states he uses a RW for short distances, has been using wheelchair as needed for to/from the dining hall. Questionable history, whether this has been since September or since last week       Hand Dominance   Dominant Hand: Right    Extremity/Trunk Assessment   Upper Extremity Assessment Upper Extremity Assessment: Defer to OT evaluation (UE incoordination as assessed fingers-to-nose, L>R)    Lower Extremity Assessment Lower Extremity Assessment: Generalized weakness    Cervical / Trunk Assessment Cervical / Trunk Assessment: Normal  Communication   Communication: No difficulties  Cognition Arousal/Alertness: Awake/alert Behavior During Therapy: WFL for tasks assessed/performed Overall Cognitive Status: No family/caregiver present to determine baseline cognitive functioning Area of Impairment:  Attention;Memory;Following commands;Safety/judgement;Problem solving                   Current Attention Level: Sustained Memory: Decreased short-term memory Following Commands: Follows one step commands with increased time Safety/Judgement: Decreased awareness of safety;Decreased awareness of deficits   Problem Solving: Decreased initiation;Requires verbal cues;Slow processing General Comments: Pt with difficulty recounting PLOF, changes PLOF multiple times. Pt with halting speech, difficulty answering some subjective questions and following more than 2 step commands.        General Comments General comments (skin integrity, edema, etc.): vss. L visual field impairment vs inattention, + visual field deficit left upper field but difficult to discern if this is due to pt difficulty with command following vs true imapirment.    Exercises     Assessment/Plan    PT Assessment Patient needs continued PT services  PT Problem List Decreased strength;Decreased mobility;Decreased safety awareness;Decreased activity tolerance;Decreased balance;Decreased knowledge of use of DME;Pain;Decreased cognition;Decreased coordination       PT Treatment Interventions DME instruction;Therapeutic activities;Gait training;Therapeutic exercise;Patient/family education;Balance training;Functional mobility training;Neuromuscular re-education    PT Goals (Current goals can be found in the Care Plan section)  Acute Rehab PT Goals Patient Stated Goal: improve balance PT Goal Formulation: With patient Time For Goal Achievement: 05/21/21 Potential to Achieve Goals: Fair    Frequency Min 3X/week     Co-evaluation               AM-PAC PT "6 Clicks" Mobility  Outcome Measure Help needed turning from your back to your side while in a flat bed without using bedrails?: A Lot Help needed moving from lying on your back to sitting on the side of a flat bed without using bedrails?: A Lot Help needed  moving to and from a bed to a chair (including  a wheelchair)?: A Lot Help needed standing up from a chair using your arms (e.g., wheelchair or bedside chair)?: A Lot Help needed to walk in hospital room?: A Lot Help needed climbing 3-5 steps with a railing? : Total 6 Click Score: 11    End of Session Equipment Utilized During Treatment: Gait belt Activity Tolerance: Patient limited by fatigue Patient left: in chair;with call bell/phone within reach;with chair alarm set;with nursing/sitter in room Nurse Communication: Mobility status PT Visit Diagnosis: Unsteadiness on feet (R26.81);Difficulty in walking, not elsewhere classified (R26.2);Other symptoms and signs involving the nervous system (R29.898)    Time: 8299-3716 PT Time Calculation (min) (ACUTE ONLY): 29 min   Charges:   PT Evaluation $PT Eval Moderate Complexity: 1 Mod PT Treatments $Therapeutic Activity: 8-22 mins        Stacie Glaze, PT DPT Acute Rehabilitation Services Pager [phone]  Office [phone]   Roxine Caddy E Ruffin Pyo 05/07/2021, 3:56 PM

## 2021-05-07 NOTE — Progress Notes (Signed)
Spoke with patient's daughter and sister-in-law at length on the phone. They are very concerned about sending patient home with his weakness and pain levels. He lives at [facility] with his wife who is significantly disabled from a previous stroke. They have "around the clock CNA care" in the independent living side. I assured them PT/OT would evaluate the pt and make their recommendations and that we would not send him home if it were not safe. I do agree patient has weakness and difficulty ambulating on his own. We also discussed his urinary retention and they said he has had trouble urinating since Tuesday and does not drink much. Family requested that pt have some time in rehab to regain strength. I educated on the process of therapy evaluations, recommendations and assistance with case management. They said they understood. Family also requested a phone in the room for pt to be able to call his wife and family. I have done that for them. Pt's daughter will be in this afternoon to visit with her dad. All questions and concerns addressed. Tannie Koskela, Rande Brunt, RN

## 2021-05-07 NOTE — Progress Notes (Signed)
Subjective: Patient reports HA, nausea somewhat improved compared with last night  Objective: Vital signs in last 24 hours: Temp:  [97.7 F (36.5 C)-99.7 F (37.6 C)] 98 F (36.7 C) (01/13 1200) Pulse Rate:  [46-69] 50 (01/13 1100) Resp:  [0-20] 19 (01/13 1100) BP: (122-167)/(68-114) 159/71 (01/13 1100) SpO2:  [92 %-99 %] 93 % (01/13 1100) Weight:  [92.5 kg] 92.5 kg (01/12 1322)  Intake/Output from previous day: 01/12 0701 - 01/13 0700 In: 670.8 [I.V.:670.8] Out: 1800 [Urine:1800] Intake/Output this shift: Total I/O In: 372.7 [I.V.:372.7] Out: 550 [Urine:550]    General : Alert, cooperative, no distress, appears stated age   Head:  Normocephalic/atraumatic   Neck: Supple Chest:  Respirations unlabored Chest wall: no tenderness or deformity Abdomen: Soft, nontender and nondistended Extremities: warm and well-perfused Skin: normal turgor, color and texture Neurologic:  Alert, oriented x 2.  Eyes open spontaneously. PERRL, EOMI, VFC, no facial droop. V1-3 intact.  No dysarthria, tongue protrusion symmetric.  CNII-XII intact. Normal strength, sensation and reflexes throughout.  No pronator drift, full strength in legs       Lab Results: Recent Labs    05/05/21 0111 05/06/21 1456  WBC 8.3 8.7  HGB 14.3 14.5  HCT 40.8 41.5  PLT 233 246   BMET Recent Labs    05/05/21 0111 05/06/21 1456  NA 130* 132*  K 3.9 4.1  CL 98 95*  CO2 23 28  GLUCOSE 179* 169*  BUN 15 14  CREATININE 0.85 0.90  CALCIUM 9.0 9.4    Studies/Results: CT ANGIO HEAD NECK W WO CM  Result Date: 05/06/2021 CLINICAL DATA:  Initial evaluation for acute head trauma. EXAM: CT ANGIOGRAPHY HEAD AND NECK TECHNIQUE: Multidetector CT imaging of the head and neck was performed using the standard protocol during bolus administration of intravenous contrast. Multiplanar CT image reconstructions and MIPs were obtained to evaluate the vascular anatomy. Carotid stenosis measurements (when applicable) are  obtained utilizing NASCET criteria, using the distal internal carotid diameter as the denominator. RADIATION DOSE REDUCTION: This exam was performed according to the departmental dose-optimization program which includes automated exposure control, adjustment of the mA and/or kV according to patient size and/or use of iterative reconstruction technique. CONTRAST:  54mL OMNIPAQUE IOHEXOL 350 MG/ML SOLN COMPARISON:  Head CT from earlier the same day. FINDINGS: CTA NECK FINDINGS Aortic arch: Visualized arch normal caliber with normal branch pattern. Mild atheromatous change about the arch and origin of the great vessels without significant stenosis. Right carotid system: Right CCA patent from its origin to the bifurcation without stenosis. Atheromatous change about the proximal cervical right ICA with associated short-segment stenosis of up to 60% by NASCET criteria (series 6, image 205). Right ICA patent distally without stenosis or dissection. Left carotid system: Left common and internal carotid arteries widely patent without stenosis, dissection or occlusion. Vertebral arteries: Both vertebral arteries arise from the subclavian arteries. No proximal subclavian artery stenosis. Both vertebral arteries widely patent without stenosis, dissection or occlusion. Skeleton: Mild-to-moderate spondylosis present at C5-6 and C6-7. Other neck: No other acute soft tissue abnormality within the neck. Upper chest: Visualized upper chest demonstrates no acute finding. Review of the MIP images confirms the above findings CTA HEAD FINDINGS Anterior circulation: Petrous segments patent bilaterally. Mild atheromatous change within the carotid siphons without significant stenosis or other abnormality. A1 segments patent bilaterally. Right A1 hypoplastic, accounting for the slightly diminutive right ICA is compared to the left. Normal anterior communicating artery complex. Anterior cerebral arteries patent without stenosis. No  M1  stenosis or occlusion. Normal MCA bifurcations. Distal MCA branches perfused and symmetric. Posterior circulation: Both V4 segments patent without stenosis. Left vertebral artery dominant. Both PICA origins patent and normal. Basilar patent to its distal aspect without stenosis. Superior cerebellar arteries patent bilaterally. Both PCAs primarily supplied via the basilar well perfused or distal aspects. Venous sinuses: Not well assessed due to timing of the contrast bolus. Anatomic variants: Hypoplastic right A1 segment. No aneurysm or other vascular malformation. Review of the MIP images confirms the above findings IMPRESSION: 1. Negative CTA for large vessel occlusion. No aneurysm or other vascular malformation. 2. 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 3. No other hemodynamically significant or correctable stenosis about the major arterial vasculature of the head and neck. Electronically Signed   By: Jeannine Boga M.D.   On: 05/06/2021 19:28   CT HEAD WO CONTRAST  Result Date: 05/07/2021 CLINICAL DATA:  Follow-up intracranial hemorrhage EXAM: CT HEAD WITHOUT CONTRAST TECHNIQUE: Contiguous axial images were obtained from the base of the skull through the vertex without intravenous contrast. RADIATION DOSE REDUCTION: This exam was performed according to the departmental dose-optimization program which includes automated exposure control, adjustment of the mA and/or kV according to patient size and/or use of iterative reconstruction technique. COMPARISON:  Yesterday FINDINGS: Brain: Intraventricular clot at the level of the occipital horns of the lateral ventricles. Brain atrophy with chronic ventriculomegaly. No infarct, shift, or masslike finding Vascular: No hyperdense vessel or unexpected calcification. Skull: Normal. Negative for fracture or focal lesion. Sinuses/Orbits: No acute finding. IMPRESSION: 1. Unchanged intraventricular hemorrhage. 2. Unchanged ventriculomegaly  associated with brain atrophy. Electronically Signed   By: Jorje Guild M.D.   On: 05/07/2021 05:23   CT Head Wo Contrast  Result Date: 05/06/2021 CLINICAL DATA:  Headache, new or worsening. EXAM: CT HEAD WITHOUT CONTRAST TECHNIQUE: Contiguous axial images were obtained from the base of the skull through the vertex without intravenous contrast. RADIATION DOSE REDUCTION: This exam was performed according to the departmental dose-optimization program which includes automated exposure control, adjustment of the mA and/or kV according to patient size and/or use of iterative reconstruction technique. COMPARISON:  CT brain 12/21/2020 FINDINGS: Brain: There is mild-to-moderate cortical atrophy, unchanged from prior and within normal limits for patient age. The ventricles are normal in configuration. The basilar cisterns are patent. No mass, mass effect, or midline shift. There is mild layering hyperdense blood within the dependent aspect of the occipital horns. No intraparenchymal hematoma is seen. No abnormal extra-axial fluid collection. Preservation of the normal cortical gray-white interface without CT evidence of an acute major vascular territorial cortical based infarction. Vascular: No hyperdense vessel or unexpected calcification. Skull: Normal. Negative for fracture or focal lesion. Sinuses/Orbits: Status post bilateral lens replacements. Mild ethmoid air cell mucosal opacification. Within the limitations of patient motion artifact, no definite calvarial fracture is seen Other: None. IMPRESSION:: IMPRESSION: There is mild layering hyperdense blood within the occipital horns of the bilateral lateral ventricles. This is new compared to 12/21/2020 prior CT. No definite intraparenchymal, subdural, or epidural hematoma is visualized. Critical Value/emergent results were called by telephone at the time of interpretation on 05/06/2021 at 3:53 pm to provider [facility] , who verbally acknowledged these results.  Electronically Signed   By: Yvonne Kendall   On: 05/06/2021 15:57    Assessment/Plan: 87 yo M with small IVH, no associated vascular abnormality, stable on multiple images - unclear as to the etiology of his hemorrhage, as he was not overtly  hypertensive in the hospital, nor is this a typical lobar hemorrhage related to amyloid angiopathy, but these two possibilities would be the main considerations.  He has a history of falls but none recently. - I discussed with patient's daughter [facility] discharge plans.  She has concerns that it would difficult to manage his headaches at home.  We will plan for discharge tomorrow morning.  He should hold his aspirin for 1 week.  He has f/u with Dr. Leta Baptist of neurology next week already established.  Vallarie Mare 05/07/2021, 1:18 PM

## 2021-05-08 LAB — GLUCOSE, CAPILLARY
Glucose-Capillary: 149 mg/dL — ABNORMAL HIGH (ref 70–99)
Glucose-Capillary: 196 mg/dL — ABNORMAL HIGH (ref 70–99)
Glucose-Capillary: 180 mg/dL — ABNORMAL HIGH (ref 70–99)

## 2021-05-08 NOTE — Progress Notes (Signed)
°  NEUROSURGERY PROGRESS NOTE   No issues overnight. Pt cont to c/o HA and nausea, although somewhat improved from yesterday. No new complaints but has required I/O cath for retention x 3 since yesterday.  EXAM:  BP (!) 151/99    Pulse (!) 53    Temp 97.8 F (36.6 C) (Oral)    Resp 14    Ht 5' 10.5" (1.791 m)    Wt 92.5 kg    SpO2 94%    BMI 28.86 kg/m   Awake, alert, oriented  Speech fluent, appropriate  CN grossly intact  5/5 BUE/BLE   IMPRESSION:  87 y.o. male with apparent spontaneous IVH. Remains neurologically intact.  PLAN: - Can transfer to floor - Cont to hold ASA - Pt does not feel ready for d/c back to facility. Possible discharge tomorrow.  Consuella Lose, MD [facility]

## 2021-05-09 LAB — GLUCOSE, CAPILLARY
Glucose-Capillary: 172 mg/dL — ABNORMAL HIGH (ref 70–99)
Glucose-Capillary: 127 mg/dL — ABNORMAL HIGH (ref 70–99)
Glucose-Capillary: 149 mg/dL — ABNORMAL HIGH (ref 70–99)
Glucose-Capillary: 214 mg/dL — ABNORMAL HIGH (ref 70–99)
Glucose-Capillary: 136 mg/dL — ABNORMAL HIGH (ref 70–99)

## 2021-05-09 MED ORDER — ACETAMINOPHEN-CODEINE #3 300-30 MG PO TABS: 1.0000 | ORAL_TABLET | Freq: Three times a day (TID) | ORAL | 0 refills | Status: AC | PRN

## 2021-05-09 NOTE — Plan of Care (Signed)
Pt is alert x 3, pt has been trying to have bowel movement with no success. PRN dulcolax suppository given per PRN order with effective results. Pt has male purewick in place.

## 2021-05-09 NOTE — Discharge Summary (Addendum)
Physician Discharge Summary  Patient ID: Logan Hobbs MRN: [id number] DOB/AGE: 85/19/1936 85 y.o.  Admit date: 05/06/2021 Discharge date: 05/10/2021  Admission Diagnoses:  Intraventricular hemorrhage  Discharge Diagnoses:  Same Principal Problem:   Intraventricular hemorrhage [facility])   Discharged Condition: Stable  Hospital Course:  Logan Hobbs is a 87 y.o. male admitted to the hospital after apparently spontaneous intraventricular hemorrhage.  Patient was found to have the hemorrhage on CT scan.  A CTA showed no vascular abnormality.  He was monitored in the intensive care unit initially with repeat CT demonstrating stability.  Initially he did complain of significant headache and nausea which subsequently improved.  Patient apparently resides at a facility where there are physical and occupational therapists.  With his significant clinical improvement and stable CT scan he was discharged home with home health PT and OT.   Discharge Exam: Blood pressure (!) 144/82, pulse 65, temperature 98 F (36.7 C), temperature source Oral, resp. rate 16, height 5' 10.5" (1.791 m), weight 92.5 kg, SpO2 99 %. Awake, alert, oriented Speech fluent, appropriate CN grossly intact 5/5 BUE/BLE Wound c/d/i  Disposition: Discharge disposition: 01-Home or Self Care       Discharge Instructions     Call MD for:  redness, tenderness, or signs of infection (pain, swelling, redness, odor or green/yellow discharge around incision site)   Complete by: As directed    Call MD for:  temperature >100.4   Complete by: As directed    Diet - low sodium heart healthy   Complete by: As directed    Discharge instructions   Complete by: As directed    Walk at home as much as possible, at least 4 times / day   Increase activity slowly   Complete by: As directed    Lifting restrictions   Complete by: As directed    No lifting > 10 lbs   May shower / Bathe   Complete by: As directed    48 hours  after surgery   May walk up steps   Complete by: As directed    Other Restrictions   Complete by: As directed    No bending/twisting at waist      Allergies as of 05/09/2021       Reactions   Latex Shortness Of Breath   Skin itching and breathing problems Other reaction(s): rash   Ciprofloxacin Other (See Comments)   constipation   Seasonal Ic [cholestatin] Other (See Comments)   Unknown reaction   Solifenacin Succinate Other (See Comments)   constipation   Tamsulosin Hcl    Weakness, vertigo, and nausea        Medication List     TAKE these medications    acetaminophen 500 MG tablet Commonly known as: TYLENOL Take 1,000 mg by mouth in the morning and at bedtime.   acetaminophen-codeine 300-30 MG tablet Commonly known as: TYLENOL #3 Take 1-2 tablets by mouth every 8 (eight) hours as needed for up to 7 days for moderate pain.   cycloSPORINE 0.05 % ophthalmic emulsion Commonly known as: RESTASIS Place 1 drop into both eyes 2 (two) times daily.   D3-1000 PO Take 2,000 mg by mouth every evening.   escitalopram 5 MG tablet Commonly known as: LEXAPRO Take 5 mg by mouth every morning.   losartan 50 MG tablet Commonly known as: COZAAR Take 50 mg by mouth daily.   MIRALAX PO Take by mouth. Once daily   ondansetron 8 MG disintegrating tablet Commonly known as:  ZOFRAN-ODT Take 1 tablet (8 mg total) by mouth every 8 (eight) hours as needed for nausea or vomiting.   rosuvastatin 10 MG tablet Commonly known as: CRESTOR Take 10 mg by mouth every morning.   sitaGLIPtin-metformin 50-1000 MG tablet Commonly known as: JANUMET Take 1 tablet by mouth 2 (two) times daily.   vitamin B-12 1000 MCG tablet Commonly known as: CYANOCOBALAMIN Take 1,000 mcg by mouth daily.        Follow-up Information     Vallarie Mare, MD. Schedule an appointment as soon as possible for a visit.   Specialty: Neurosurgery Contact information: [address] [place] [address] [phone]

## 2021-05-09 NOTE — Progress Notes (Signed)
°  NEUROSURGERY PROGRESS NOTE   No issues overnight. Significantly improved HA and nausea today. Voiding without difficulty.  EXAM:  BP (!) 144/82 (BP Location: Right Arm)    Pulse 65    Temp 98 F (36.7 C) (Oral)    Resp 16    Ht 5' 10.5" (1.791 m)    Wt 92.5 kg    SpO2 99%    BMI 28.86 kg/m   Awake, alert, oriented  Speech fluent, appropriate  CN grossly intact  5/5 BUE/BLE   IMPRESSION:  87 y.o. male with apparent spontaneous IVH. Remains neurologically intact. Significant clinical improvement today.  PLAN: - d/c back to facility today with PT/OT - Cont to hold ASA - f/u with Dr. Marcello Moores in 2-3 weeks.  Consuella Lose, MD [facility]

## 2021-05-09 NOTE — Evaluation (Signed)
Occupational Therapy Evaluation Patient Details Name: Logan Hobbs MRN: [id number] DOB: 1935/01/01 Today's Date: 05/09/2021   History of Present Illness 87 yo male presents to [facility] on 1/10 with headache, nausea after receiving shingles vaccine yesterday. Pt d/c, back on 1/12 with headache and dizziness. CT revealed mild layering of hyperdense blood within occipital horns of bilat lateral ventricles, no IPH/SDH/SAH; IVH favored secondary to HTN. PMH includes multiple falls with + head trauma, hypertension, diabetes, hyperlipidemia, prostate cancer, anxiety.   Clinical Impression   Logan Hobbs reports using a RW for in home mobility and a wc to go to the cafeteria at his [facility], he also reports being generally mod I fro ADLs. Pt not an accurate historian this date, PLOF should be confirmed with family. He lives at [place] with his wife who is sp CVA and is at wc level herself. Upon arrival, pt was perseverating on his ability to d/c home. He was very difficult to re-direct to task this session despite max cues. Pt required mod A for bed mobility and significantly increased time. He also required max A for sit<>stand with side stepping at the bed side. Pt had posterior bias and required support of the bed for standing balance. Due to deficits noted, pt requires up to max A for ADLs. Pt's vision shoul dbe further assessed. Despite heavy assist levels and education, pt continues to state he can go home with very limited insight into safety and his deficits. He is a high fall and safety risk this date. Recommend d/c to SNF due to high assistance level required. OT to follow acutely.      Recommendations for follow up therapy are one component of a multi-disciplinary discharge planning process, led by the attending physician.  Recommendations may be updated based on patient status, additional functional criteria and insurance authorization.   Follow Up Recommendations  Skilled nursing-short term rehab  (<3 hours/day)    Assistance Recommended at Discharge Frequent or constant Supervision/Assistance  Patient can return home with the following A lot of help with walking and/or transfers;A lot of help with bathing/dressing/bathroom;Assistance with cooking/housework;Direct supervision/assist for medications management;Assist for transportation;Help with stairs or ramp for entrance    Functional Status Assessment  Patient has had a recent decline in their functional status and demonstrates the ability to make significant improvements in function in a reasonable and predictable amount of time.  Equipment Recommendations  None recommended by OT       Precautions / Restrictions Precautions Precautions: Fall Restrictions Weight Bearing Restrictions: No      Mobility Bed Mobility Overal bed mobility: Needs Assistance Bed Mobility: Supine to Sit;Sit to Supine     Supine to sit: Mod assist;HOB elevated Sit to supine: Mod assist   General bed mobility comments: +significantly incrased time to allow pt to adjust hips towards the EOB    Transfers Overall transfer level: Needs assistance Equipment used: Rolling walker (2 wheels) Transfers: Sit to/from Stand Sit to Stand: Max assist           General transfer comment: max A to boost into standing from elevated surface, verbal cues for hand placement. Pt with posterior bais and reliant on support of bed behind his legs fro standing balance      Balance Overall balance assessment: Needs assistance;History of Falls Sitting-balance support: No upper extremity supported;Feet supported Sitting balance-Leahy Scale: Fair   Postural control: Posterior lean Standing balance support: Bilateral upper extremity supported;During functional activity Standing balance-Leahy Scale: Poor  ADL either performed or assessed with clinical judgement   ADL Overall ADL's : Needs  assistance/impaired Eating/Feeding: Independent;Sitting   Grooming: Minimal assistance;Sitting   Upper Body Bathing: Minimal assistance;Sitting   Lower Body Bathing: Maximal assistance;Sit to/from stand   Upper Body Dressing : Minimal assistance;Sitting   Lower Body Dressing: Maximal assistance;Sit to/from stand   Toilet Transfer: Maximal assistance;Stand-pivot;BSC/3in1   Toileting- Clothing Manipulation and Hygiene: Maximal assistance;Sit to/from stand       Functional mobility during ADLs: Maximal assistance;Rolling walker (2 wheels) General ADL Comments: pt limited by poor insight to deficits and safety, difficulty sequencing, impaired coordination, generalized weakness and posterior bias. He requires verbal cues for all funcitonal tasks     Vision Baseline Vision/History: 1 Wears glasses Ability to See in Adequate Light: 0 Adequate Patient Visual Report: No change from baseline Additional Comments: difficult to fully assess, pt not following cues for visual assessment. Overall seems funcitonal for ADLs, pt denies changes in his vision     Perception     Praxis      Pertinent Vitals/Pain Pain Assessment: No/denies pain Pain Intervention(s): Monitored during session     Hand Dominance Right   Extremity/Trunk Assessment Upper Extremity Assessment Upper Extremity Assessment: Generalized weakness (globally 4/5 MMT, WFL for ROM)   Lower Extremity Assessment Lower Extremity Assessment: Defer to PT evaluation   Cervical / Trunk Assessment Cervical / Trunk Assessment: Normal   Communication Communication Communication: Expressive difficulties (difficulty word finding, perseverates)   Cognition Arousal/Alertness: Awake/alert Behavior During Therapy: Impulsive Overall Cognitive Status: No family/caregiver present to determine baseline cognitive functioning Area of Impairment: Attention;Memory;Safety/judgement;Following commands;Awareness;Problem solving                    Current Attention Level: Sustained Memory: Decreased recall of precautions;Decreased short-term memory Following Commands: Follows one step commands with increased time Safety/Judgement: Decreased awareness of safety;Decreased awareness of deficits Awareness: Intellectual Problem Solving: Slow processing;Decreased initiation;Requires verbal cues;Difficulty sequencing General Comments: Pt perseverating on the MD stating he can go home and difficult to redirect. Pt requires simple one step commands and moderating cueing to sequence through simple tasks. He has very poor insight into his deficits, despite education and requiring mod-max A for bed mobility & sit<>stand, pt continues to state he can go home.     General Comments  VSS on RA. difficult wtih command following for full assessment, pt perseverating on d/c and the MD stating he can go home.    Exercises     Shoulder Instructions      Home Living Family/patient expects to be discharged to:: Private residence Living Arrangements: Spouse/significant other Available Help at Discharge: Family Type of Home: Independent living facility Home Access: Level entry     Home Layout: One level     Bathroom Shower/Tub: Occupational psychologist: Standard     Home Equipment: Conservation officer, nature (2 wheels);Wheelchair - Banker;Shower seat   Additional Comments: spouse is in a wheelchair      Prior Functioning/Environment Prior Level of Function : Needs assist;Patient poor historian/Family not available             Mobility Comments: pt states he uses a RW for short distances, has been using wheelchair as needed for to/from the dining hall. Questionable history, whether this has been since September or since last week ADLs Comments: pt states that he is indep wtih ADLs however he is a poor historian        OT Problem List: Decreased  strength;Decreased range of motion;Decreased activity  tolerance;Impaired balance (sitting and/or standing);Decreased cognition;Decreased safety awareness;Decreased knowledge of use of DME or AE;Pain      OT Treatment/Interventions: Self-care/ADL training;Therapeutic exercise;DME and/or AE instruction;Therapeutic activities;Patient/family education;Balance training    OT Goals(Current goals can be found in the care plan section) Acute Rehab OT Goals Patient Stated Goal: to go home OT Goal Formulation: With patient Time For Goal Achievement: 05/23/21 Potential to Achieve Goals: Good ADL Goals Pt Will Perform Grooming: with modified independence;standing Pt Will Perform Lower Body Bathing: with supervision;sit to/from stand Pt Will Perform Lower Body Dressing: with supervision;sit to/from stand Pt Will Transfer to Toilet: with modified independence;ambulating Additional ADL Goal #1: Pt will indep sequence through 3 step trail making task  OT Frequency: Min 2X/week    Co-evaluation              AM-PAC OT "6 Clicks" Daily Activity     Outcome Measure Help from another person eating meals?: A Little Help from another person taking care of personal grooming?: A Little Help from another person toileting, which includes using toliet, bedpan, or urinal?: A Lot Help from another person bathing (including washing, rinsing, drying)?: A Lot Help from another person to put on and taking off regular upper body clothing?: A Little Help from another person to put on and taking off regular lower body clothing?: A Lot 6 Click Score: 15   End of Session Equipment Utilized During Treatment: Gait belt;Rolling walker (2 wheels) Nurse Communication: Mobility status  Activity Tolerance: Patient tolerated treatment well Patient left: in bed;with call bell/phone within reach;with bed alarm set;with family/visitor present  OT Visit Diagnosis: Unsteadiness on feet (R26.81);Other abnormalities of gait and mobility (R26.89);Muscle weakness (generalized)  (M62.81);Pain;History of falling (Z91.81)                Time: 3845-3646 OT Time Calculation (min): 27 min Charges:  OT General Charges $OT Visit: 1 Visit OT Evaluation $OT Eval Moderate Complexity: 1 Mod OT Treatments $Therapeutic Activity: 8-22 mins  Shakiara Lukic A Ahria Slappey 05/09/2021, 5:35 PM

## 2021-05-10 LAB — RESP PANEL BY RT-PCR (FLU A&B, COVID) ARPGX2
Influenza A by PCR: NEGATIVE
Influenza B by PCR: NEGATIVE
SARS Coronavirus 2 by RT PCR: NEGATIVE

## 2021-05-10 LAB — GLUCOSE, CAPILLARY
Glucose-Capillary: 142 mg/dL — ABNORMAL HIGH (ref 70–99)
Glucose-Capillary: 172 mg/dL — ABNORMAL HIGH (ref 70–99)
Glucose-Capillary: 135 mg/dL — ABNORMAL HIGH (ref 70–99)

## 2021-05-10 NOTE — TOC Transition Note (Addendum)
Transition of Care [facility]) - CM/SW Discharge Note   Patient Details  Name: Logan Hobbs MRN: [id number] Date of Birth: 1935-01-08  Transition of [facility]) CM/SW Contact:  Geralynn Ochs, LCSW Phone Number: 05/10/2021, 2:26 PM   Clinical Narrative:   Nurse to call report to [phone], Room 605.    Final next level of care: Skilled Nursing Facility Barriers to Discharge: Barriers Resolved   Patient Goals and CMS Choice Patient states their goals for this hospitalization and ongoing recovery are:: Rehab CMS Medicare.gov Compare Post Acute Care list provided to:: Patient Represenative (must comment) Choice offered to / list presented to : Adult Children, Patient  Discharge Placement              Patient chooses bed at: [facility] Patient to be transferred to facility by: [facility] Name of family member notified: Renie Ora Patient and family notified of of transfer: 05/10/21  Discharge Plan and Services In-house Referral: Clinical Social Work   Post Acute Care Choice: [place]                               Social Determinants of Health (SDOH) Interventions     Readmission Risk Interventions No flowsheet data found.

## 2021-05-10 NOTE — Plan of Care (Signed)
Pt alert oriented x 4. Pt is +1 assist to bedside commode, pt is unsteady but is able to transfer to bedside commode with time and help. Pt c/o headache, PRN tylenol #3 given per order. pts male purewick has been removed, area cleansed and replaced. Pt resting no distress noted.

## 2021-05-10 NOTE — Care Management Important Message (Signed)
Important Message  Patient Details  Name: Logan Hobbs MRN: [id number] Date of Birth: [birth year]   Medicare Important Message Given:  Yes     Memory Argue 05/10/2021, 3:03 PM

## 2021-05-10 NOTE — Progress Notes (Signed)
Pt received bath this morning, full linen changed. Pt assisted to bedside commode multiple times but unable to have a bowel movement.  Pt had smears. PRN miralax given.

## 2021-05-10 NOTE — Progress Notes (Signed)
Physical Therapy Treatment Patient Details Name: CLARKSON ROSSELLI MRN: [id number] DOB: May 18, 1934 Today's Date: 05/10/2021   History of Present Illness 87 yo male presents to [facility] on 1/10 with headache, nausea after receiving shingles vaccine yesterday. Pt d/c, back on 1/12 with headache and dizziness. CT revealed mild layering of hyperdense blood within occipital horns of bilat lateral ventricles, no IPH/SDH/SAH; IVH favored secondary to HTN. PMH includes multiple falls with + head trauma, hypertension, diabetes, hyperlipidemia, prostate cancer, anxiety.    PT Comments    Pt was seen for porgression of mobility from initial visit but declines to get OOB with PT.  Instead he did agree to some there exercise with more assistance needed on RLE.  HA today but per pt is an issue every day.  Follow acutely for there exercise and gait as tolerated, and encourage OOB to chair as pt is willing.     Recommendations for follow up therapy are one component of a multi-disciplinary discharge planning process, led by the attending physician.  Recommendations may be updated based on patient status, additional functional criteria and insurance authorization.  Follow Up Recommendations  Skilled nursing-short term rehab (<3 hours/day)     Assistance Recommended at Discharge Frequent or constant Supervision/Assistance  Patient can return home with the following A lot of help with walking and/or transfers;A lot of help with bathing/dressing/bathroom;Direct supervision/assist for financial management;Assist for transportation;Direct supervision/assist for medications management;Assistance with cooking/housework;Help with stairs or ramp for entrance   Equipment Recommendations  None recommended by PT    Recommendations for Other Services       Precautions / Restrictions Precautions Precautions: Fall Precaution Comments: monitor symptoms, HA Restrictions Weight Bearing Restrictions: No Other  Position/Activity Restrictions: HA with eyes closed     Mobility  Bed Mobility Overal bed mobility: Needs Assistance             General bed mobility comments: shifting scooting up bed with max assist from PT    Transfers                   General transfer comment: declines    Ambulation/Gait                   Stairs             Wheelchair Mobility    Modified Rankin (Stroke Patients Only)       Balance                                            Cognition Arousal/Alertness: Awake/alert Behavior During Therapy: Flat affect Overall Cognitive Status: No family/caregiver present to determine baseline cognitive functioning Area of Impairment: Orientation, Attention, Memory, Following commands, Safety/judgement, Awareness, Problem solving                 Orientation Level: Situation Current Attention Level: Sustained Memory: Decreased short-term memory Following Commands: Follows one step commands with increased time Safety/Judgement: Decreased awareness of deficits, Decreased awareness of safety Awareness: Emergent Problem Solving: Slow processing, Decreased initiation, Difficulty sequencing, Requires verbal cues, Requires tactile cues General Comments: pt is requiring hands on cues for mobility on legs, unable to follow less than multi modal commands        Exercises General Exercises - Lower Extremity Ankle Circles/Pumps: AAROM, 5 reps Quad Sets: AROM, 10 reps Gluteal Sets: AROM, 10 reps Heel Slides: AAROM,  10 reps Hip ABduction/ADduction: AAROM, 10 reps Straight Leg Raises: AAROM, 10 reps Hip Flexion/Marching: AAROM, 10 reps    General Comments        Pertinent Vitals/Pain Pain Assessment Pain Assessment: Faces Faces Pain Scale: Hurts whole lot Pain Location: head (frontal region), back of neck Pain Descriptors / Indicators: Sore, Headache Pain Intervention(s): Limited activity within patient's  tolerance, Monitored during session, Premedicated before session, Repositioned    Home Living                          Prior Function            PT Goals (current goals can now be found in the care plan section) Acute Rehab PT Goals Patient Stated Goal: get home after rehab Potential to Achieve Goals: Fair Progress towards PT goals: Not progressing toward goals - comment    Frequency    Min 3X/week      PT Plan Current plan remains appropriate    Co-evaluation              AM-PAC PT "6 Clicks" Mobility   Outcome Measure  Help needed turning from your back to your side while in a flat bed without using bedrails?: A Little Help needed moving from lying on your back to sitting on the side of a flat bed without using bedrails?: A Little Help needed moving to and from a bed to a chair (including a wheelchair)?: A Lot Help needed standing up from a chair using your arms (e.g., wheelchair or bedside chair)?: A Lot Help needed to walk in hospital room?: A Lot Help needed climbing 3-5 steps with a railing? : A Lot 6 Click Score: 14    End of Session Equipment Utilized During Treatment: Gait belt Activity Tolerance: Patient limited by fatigue Patient left: with call bell/phone within reach;in bed;with bed alarm set Nurse Communication: Mobility status PT Visit Diagnosis: Unsteadiness on feet (R26.81);Difficulty in walking, not elsewhere classified (R26.2);Other symptoms and signs involving the nervous system (R29.898)     Time: 4259-5638 PT Time Calculation (min) (ACUTE ONLY): 21 min  Charges:  $Therapeutic Exercise: 8-22 mins $Therapeutic Activity: 8-22 mins      Ramond Dial 05/10/2021, 5:22 PM  Mee Hives, PT PhD Acute Rehab Dept. Number: [place]

## 2021-05-11 ENCOUNTER — Encounter: Payer: Self-pay | Admitting: *Deleted

## 2021-05-12 ENCOUNTER — Telehealth: Payer: Self-pay | Admitting: Diagnostic Neuroimaging

## 2021-05-12 ENCOUNTER — Encounter: Payer: Self-pay | Admitting: Diagnostic Neuroimaging

## 2021-05-12 ENCOUNTER — Ambulatory Visit (INDEPENDENT_AMBULATORY_CARE_PROVIDER_SITE_OTHER): Payer: Medicare Other | Admitting: Diagnostic Neuroimaging

## 2021-05-12 VITALS — BP 120/76 | HR 84 | Ht 70.5 in | Wt 204.0 lb

## 2021-05-12 DIAGNOSIS — R261 Paralytic gait: Secondary | ICD-10-CM | POA: Diagnosis not present

## 2021-05-12 DIAGNOSIS — G1220 Motor neuron disease, unspecified: Secondary | ICD-10-CM | POA: Diagnosis not present

## 2021-05-12 DIAGNOSIS — R269 Unspecified abnormalities of gait and mobility: Secondary | ICD-10-CM | POA: Diagnosis not present

## 2021-05-12 DIAGNOSIS — E1142 Type 2 diabetes mellitus with diabetic polyneuropathy: Secondary | ICD-10-CM

## 2021-05-12 NOTE — Telephone Encounter (Signed)
medicare/bcbs fed order sent to [facility], [facility] they will reach out to the patient to schedule.

## 2021-05-12 NOTE — Progress Notes (Signed)
[facility]  PATIENT: Logan Hobbs DOB: [birth year]  REFERRING CLINICIAN: Kathyrn Lass, MD HISTORY FROM: patient  REASON FOR VISIT: new consult    HISTORICAL  CHIEF COMPLAINT:  Chief Complaint  Patient presents with   Neuropathy, balance, falls    Rm 6 New Pt  Freda Munro- caregiver,  at [facility] for rehab following hospitalization    HISTORY OF PRESENT ILLNESS:   87 year old male here for evaluation of gait and balance difficulty.  Patient has had gait balance difficulty since 2013, previous evaluated by outside neurologist.  He was diagnosed with diabetic neuropathy.  He has used cane, walker for several years.  Unfortunately his gait has continued to decline.  Now patient feeling stiffness in his legs.  He has had several major falls especially in August 2022 when he tripped, fell forward and landed on his chin on the concrete.  He went to the hospital for evaluation.  He had some headaches after this.  He returned to the hospital more recently a week ago due to increasing headaches.  He was found to have intraventricular hemorrhage, thought to be related to hypertensive cause.   REVIEW OF SYSTEMS: Full 14 system review of systems performed and negative with exception of: as per HPI.  ALLERGIES: Allergies  Allergen Reactions   Latex Shortness Of Breath    Skin itching and breathing problems Other reaction(s): rash   Ciprofloxacin Other (See Comments)    constipation   Seasonal Ic [Cholestatin] Other (See Comments)    Unknown reaction   Solifenacin Succinate Other (See Comments)    constipation   Tamsulosin Hcl     Weakness, vertigo, and nausea    HOME MEDICATIONS: Outpatient Medications Prior to Visit  Medication Sig Dispense Refill   acetaminophen (TYLENOL) 500 MG tablet Take 1,000 mg by mouth in the morning and at bedtime.     acetaminophen-codeine (TYLENOL #3) 300-30 MG tablet Take 1-2 tablets by mouth every 8 (eight) hours as needed for up to  7 days for moderate pain. 30 tablet 0   Cholecalciferol (D3-1000 PO) Take 2,000 mg by mouth every evening.     cycloSPORINE (RESTASIS) 0.05 % ophthalmic emulsion Place 1 drop into both eyes 2 (two) times daily.     escitalopram (LEXAPRO) 5 MG tablet Take 5 mg by mouth every morning.     losartan (COZAAR) 50 MG tablet Take 50 mg by mouth daily.     ondansetron (ZOFRAN-ODT) 8 MG disintegrating tablet Take 1 tablet (8 mg total) by mouth every 8 (eight) hours as needed for nausea or vomiting. 20 tablet 0   Polyethylene Glycol 3350 (MIRALAX PO) Take by mouth. Once daily     rosuvastatin (CRESTOR) 10 MG tablet Take 10 mg by mouth every morning.     sitaGLIPtin-metformin (JANUMET) 50-1000 MG tablet Take 1 tablet by mouth 2 (two) times daily.     vitamin B-12 (CYANOCOBALAMIN) 1000 MCG tablet Take 1,000 mcg by mouth daily.     No facility-administered medications prior to visit.    PAST MEDICAL HISTORY: Past Medical History:  Diagnosis Date   Abrasion 02/10/2021   Dec 25, 2020 Entered By: Ree Kida Comment: Status post fall   Allergic rhinitis 02/02/2011   Anxiety 12/13/2012   [facility]; [facility] when necessary.   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 Followed by Dr. Farris Has at [facility].   Cataract 09/18/2020   Cognitive disorder 02/10/2021   Colon polyp 02/13/2012   Dec. 16, 2011 and next one due 5 years   Constipation 09/18/2020   Diabetes mellitus with coincident hypertension ([place]) 03/31/2017   Diabetes mellitus with neuropathy ([place]) 02/14/2013   Dizziness    Encounter for fitting and adjustment of hearing aid 09/18/2020   Essential hypertension  05/16/2018   Falls    Gait abnormality 03/15/2015   Followed by Dr. Yolonda Kida with [facility]. He does have neuropathy. Dr. Audley Hose note 07/03/14 says etiology of apractic gait unclear. Ongoing neurology follow up.   Hearing loss 05/16/2018   History of fall 02/10/2021   Hyperlipidemia 05/16/2018   Impacted cerumen, bilateral 09/18/2020   Lumbar degenerative disc disease 03/15/2015   Mild multilevel degenerative disc changes on MRI of the lumbar spine ordered by Dr. Yolonda Kida, done 06/15/14   Male erectile dysfunction, unspecified 09/13/2016   Malignant neoplasm of prostate ([place]) 10/26/2010   Need for prophylactic vaccination and inoculation against influenza 09/18/2020   Neuropathy 06/20/2014   Obesity 09/18/2020   Onychomycosis 05/16/2018   Osteoarthritis 05/17/2013   Left shoulder   Other abnormalities of gait and mobility 02/10/2021   Pre-operative cardiovascular examination 05/16/2018   Rash and other nonspecific skin eruption 09/18/2020   Ringing in ears 09/18/2020   Senile nuclear sclerosis 01/30/2013   Sensorineural hearing loss, bilateral 09/18/2020   Syncope and collapse 12/21/2020   Tinea cruris 05/16/2018   Tremor 02/10/2021   Trochanteric bursitis of right hip 05/28/2019   Type 2 diabetes mellitus without complications ([place]) 18/29/9371   Vertigo 05/16/2018   Vitamin D insufficiency 05/27/2013    PAST SURGICAL HISTORY: Past Surgical History:  Procedure Laterality Date   BLADDER SURGERY     bladder neck contracture repair   CATARACT EXTRACTION Left 2014   PROSTATE SURGERY  2009   TONSILLECTOMY     VASECTOMY  1978   WISDOM TOOTH EXTRACTION      FAMILY HISTORY: Family History  Problem Relation Age of Onset   Heart disease Father    Hypertension Father    Heart disease Brother    Cancer Brother    Diabetes Neg Hx     SOCIAL HISTORY: Social History   Socioeconomic History   Marital status: Married    Spouse name: Not on file   Number of  children: 1   Years of education: Not on file   Highest education level: Master's degree (e.g., MA, MS, MEng, MEd, MSW, MBA)  Occupational History    Comment: retired Chiropodist  Tobacco Use   Smoking status: Never   Smokeless tobacco: Never  Substance and Sexual Activity   Alcohol use: Not Currently    Alcohol/week: 7.0 standard drinks    Types: 7 Cans of beer per week   Drug use: Never   Sexual activity: Not on file  Other Topics Concern   Not on file  Social History Narrative   05/12/21 lives at [place] [facility] for rehab now   Caffeine    Social Determinants of Health   Financial Resource Strain: Not on file  Food Insecurity: Not on file  Transportation Needs: Not on file  Physical Activity: Not on file  Stress: Not on file  Social Connections: Not on file  Intimate Partner Violence: Not on file  PHYSICAL EXAM  GENERAL EXAM/CONSTITUTIONAL: Vitals:  Vitals:   05/12/21 1029  BP: 120/76  Pulse: 84  Weight: 204 lb (92.5 kg)  Height: 5' 10.5" (1.791 m)   Body mass index is 28.86 kg/m. Wt Readings from Last 3 Encounters:  05/12/21 204 lb (92.5 kg)  05/06/21 204 lb (92.5 kg)  05/04/21 204 lb (92.5 kg)   Patient is in no distress; well developed, nourished and groomed; neck is supple  CARDIOVASCULAR: Examination of carotid arteries is normal; no carotid bruits Regular rate and rhythm, no murmurs Examination of peripheral vascular system by observation and palpation is normal  EYES: Ophthalmoscopic exam of optic discs and posterior segments is normal; no papilledema or hemorrhages No results found.  MUSCULOSKELETAL: Gait, strength, tone, movements noted in Neurologic exam below  NEUROLOGIC: MENTAL STATUS:  No flowsheet data found. awake, alert, oriented to person, place and time recent and remote memory intact normal attention and concentration language fluent, comprehension intact, naming intact fund of knowledge appropriate  CRANIAL NERVE:   2nd - no papilledema on fundoscopic exam 2nd, 3rd, 4th, 6th - pupils equal and reactive to light, visual fields full to confrontation, extraocular muscles intact, no nystagmus 5th - facial sensation symmetric 7th - facial strength symmetric 8th - hearing intact 9th - palate elevates symmetrically, uvula midline 11th - shoulder shrug symmetric 12th - tongue protrusion midline HOARSE VOICE  MOTOR:  normal bulk; INCREASED TONE IN BLE BUE 5; BLE 5  SENSORY:  normal and symmetric to light touch, temperature, vibration; except decr in feet  COORDINATION:  finger-nose-finger, fine finger movements SLOW  REFLEXES:  deep tendon reflexes 1+ and symmetric  GAIT/STATION:  SHORT STEPS; VERY UNSTEADY; BARELY ABLE TO STAND WITH 2 PERSON ASSIST     DIAGNOSTIC DATA (LABS, IMAGING, TESTING) - I reviewed patient records, labs, notes, testing and imaging myself where available.  Lab Results  Component Value Date   WBC 8.7 05/06/2021   HGB 14.5 05/06/2021   HCT 41.5 05/06/2021   MCV 97.6 05/06/2021   PLT 246 05/06/2021      Component Value Date/Time   NA 132 (L) 05/06/2021 1456   NA 134 02/26/2021 1112   K 4.1 05/06/2021 1456   CL 95 (L) 05/06/2021 1456   CO2 28 05/06/2021 1456   GLUCOSE 169 (H) 05/06/2021 1456   BUN 14 05/06/2021 1456   BUN 11 02/26/2021 1112   CREATININE 0.90 05/06/2021 1456   CALCIUM 9.4 05/06/2021 1456   PROT 7.6 05/06/2021 1456   ALBUMIN 4.4 05/06/2021 1456   AST 17 05/06/2021 1456   ALT 13 05/06/2021 1456   ALKPHOS 35 (L) 05/06/2021 1456   BILITOT 0.8 05/06/2021 1456   GFRNONAA >60 05/06/2021 1456   No results found for: CHOL, HDL, LDLCALC, LDLDIRECT, TRIG, CHOLHDL Lab Results  Component Value Date   HGBA1C 7.8 (H) 12/22/2020   No results found for: VITAMINB12 Lab Results  Component Value Date   TSH 0.647 12/21/2020    01/28/14 EMG/NCS This is an abnormal EMG and nerve conduction study of the upper and lower extremities. There is  electrophysiologic evidence for minimal mixed sensorimotor polyneuropathy. There is electrophysiologic evidence for minimal left median neuropathy at the wrist and mild non-localizable left ulnar neuropathy. There is no electrophysiologic evidence for a radiculopathy, plexopathy, or myopathy.   12/21/20 CT head / cervical spine  1. No acute intracranial pathology. Small-vessel white matter disease. 2. No displaced fracture or dislocation of the facial bones. 3. Soft tissue contusion of the  chin. 4. No fracture or static subluxation of the cervical spine. 5. Focally moderate disc space height loss and osteophytosis of C5-C6, with otherwise mild disc degenerative disease.   05/07/21 CT head  1. Unchanged intraventricular hemorrhage. 2. Unchanged ventriculomegaly associated with brain atrophy.   05/06/21 CTA head / neck 1. Negative CTA for large vessel occlusion. No aneurysm or other vascular malformation. 2. 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 3. No other hemodynamically significant or correctable stenosis about the major arterial vasculature of the head and neck.     ASSESSMENT AND PLAN  87 y.o. year old male here with longstanding gait and balance difficulty since 2013 likely related to diabetic neuropathy.  However has developed additional gait difficulty more recently with increased tone in the lower extremities and spastic unsteady gait.  We will proceed with further work-up to rule out other causes such as stroke, myelopathy, B12 deficiency, copper deficiency, paraneoplastic syndrome or stiff person syndrome.  Dx:  1. Spastic gait   2. Gait difficulty   3. 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 Eval   Copper, serum   Ceruloplasmin   Zinc   Return for pending if symptoms worsen or fail to improve, pending test results.  I reviewed images, labs, notes, records myself. I summarized findings and reviewed with patient, for this high risk condition (spastic gait; frequent falls, intraventricular hemorrhage) requiring high complexity decision making.    Penni Bombard, MD 2/53/6644, 03:47 AM Certified in Neurology, Neurophysiology and Neuroimaging  [facility] [address] [place], [place] [address] [phone]

## 2021-05-12 NOTE — Patient Instructions (Signed)
GAIT DIFFICULTY (since 2013; due to long standing diabetic neuropathy; also with spastic gait that needs further workup) - check MRI brain, cervical, thoracic spine - check labs  INTRAVENTRICULAR HEMORRHAGE - recently; possible due to HTN vs traumatic - stable

## 2021-05-14 LAB — PARANEOPLASTIC AB
AGNA-1: NEGATIVE
Amphiphysin Antibody: NEGATIVE
Anti-Hu Ab: NEGATIVE
Anti-Ri Ab: NEGATIVE
Anti-Yo Ab: NEGATIVE
Antineruonal nuclear Ab Type 3: NEGATIVE
CASPR2 Antibody,Cell-based IFA: NEGATIVE
CRMP-5 IgG: NEGATIVE
Interpretation: NEGATIVE
LGI1 Antibody, Cell-based IFA: NEGATIVE
Purkinje Cell Cyto Ab Type 2: NEGATIVE
Purkinje Cell Cyto Ab Type Tr: NEGATIVE
VGCC Antibody: <1 pmol/L (ref 0.0–30.0)

## 2021-05-15 LAB — VITAMIN B12: Vitamin B-12: 1065 pg/mL (ref 232–1245)

## 2021-05-15 LAB — COPPER, SERUM: Copper: 89 ug/dL (ref 69–132)

## 2021-05-15 LAB — CERULOPLASMIN: Ceruloplasmin: 20.5 mg/dL (ref 16.0–31.0)

## 2021-05-15 LAB — GLUTAMIC ACID DECARBOXYLASE AUTO ABS: Glutamic Acid Decarb Ab: <5 U/mL (ref 0.0–5.0)

## 2021-05-15 LAB — ZINC: Zinc: 69 ug/dL (ref 44–115)

## 2021-05-26 ENCOUNTER — Ambulatory Visit
Admission: RE | Admit: 2021-05-26 | Discharge: 2021-05-26 | Disposition: A | Discharge status: Home / Self Care | Payer: Medicare Other | Source: Ambulatory Visit | Attending: Diagnostic Neuroimaging | Admitting: Diagnostic Neuroimaging

## 2021-05-26 DIAGNOSIS — R269 Unspecified abnormalities of gait and mobility: Secondary | ICD-10-CM

## 2021-05-26 DIAGNOSIS — R261 Paralytic gait: Secondary | ICD-10-CM

## 2021-05-26 MED ORDER — GADOBENATE DIMEGLUMINE 529 MG/ML IV SOLN
20.0000 mL | Freq: Once | INTRAVENOUS | Status: AC | PRN
  Administered 2021-05-26: 20 mL via INTRAVENOUS

## 2021-05-28 ENCOUNTER — Ambulatory Visit
Admission: RE | Admit: 2021-05-28 | Discharge: 2021-05-28 | Disposition: A | Discharge status: Home / Self Care | Payer: Medicare Other | Source: Ambulatory Visit | Attending: Diagnostic Neuroimaging | Admitting: Diagnostic Neuroimaging

## 2021-05-28 ENCOUNTER — Other Ambulatory Visit: Payer: Self-pay

## 2021-05-28 DIAGNOSIS — R261 Paralytic gait: Secondary | ICD-10-CM

## 2021-05-28 DIAGNOSIS — R269 Unspecified abnormalities of gait and mobility: Secondary | ICD-10-CM

## 2021-05-28 DIAGNOSIS — G1220 Motor neuron disease, unspecified: Secondary | ICD-10-CM

## 2021-05-28 MED ORDER — GADOBENATE DIMEGLUMINE 529 MG/ML IV SOLN
18.0000 mL | Freq: Once | INTRAVENOUS | Status: AC | PRN
  Administered 2021-05-28: 18 mL via INTRAVENOUS

## 2021-06-08 ENCOUNTER — Telehealth: Payer: Self-pay | Admitting: *Deleted

## 2021-06-08 NOTE — Telephone Encounter (Signed)
Pt would like a call from the nurse to discuss what on the other MRIs.

## 2021-06-08 NOTE — Telephone Encounter (Signed)
Called patient and informed him the MRI brain showed unremarkable imaging results. His MRI cervical spine showed pinched nerves at multiple levels, but no spinal cord issues.  The MRI thoracic spine showed unremarkable imaging results. Dr Leta Baptist advises to follow plan. He stated he is currently getting therapy to strengthen his legs, asked if it was ok to proceed. I advised him it is ok.  He asked that reports be sent to his PCP, [facility],[address] Reports faxed as requested.

## 2021-06-09 ENCOUNTER — Encounter: Payer: Self-pay | Admitting: *Deleted

## 2021-06-09 NOTE — Telephone Encounter (Signed)
Daughter Clifford, on [place], called to review results per her father. Reviewed results, answered her questions to her stated satisfaction. I advised if other labs are not normal I'll call her back. She  verbalized understanding, appreciation.  Called [facility] to check on 2 outstanding results. Spoke with Regino Schultze who stated he only has Paraneoplastic Ab lab. He faxed result which is normal.

## 2021-06-09 NOTE — Telephone Encounter (Signed)
Called patient who gave phone to caregiver, Freda Munro, on [place]. Phone was put on speaker, and patient had multiple questions. I answered to his stated satisfaction. Freda Munro asked if they could get copies of MRI reports. I informed her if a release of information is signed. She and patient verbalized understanding, appreciation.

## 2021-09-02 ENCOUNTER — Other Ambulatory Visit: Payer: Self-pay

## 2021-09-02 DIAGNOSIS — R194 Change in bowel habit: Secondary | ICD-10-CM

## 2021-09-02 DIAGNOSIS — W19XXXA Unspecified fall, initial encounter: Secondary | ICD-10-CM | POA: Insufficient documentation

## 2021-09-02 DIAGNOSIS — R42 Dizziness and giddiness: Secondary | ICD-10-CM | POA: Insufficient documentation

## 2021-09-02 DIAGNOSIS — R197 Diarrhea, unspecified: Secondary | ICD-10-CM | POA: Insufficient documentation

## 2021-09-02 HISTORY — DX: Diarrhea, unspecified: R19.7

## 2021-09-02 HISTORY — DX: Change in bowel habit: R19.4

## 2021-09-03 ENCOUNTER — Ambulatory Visit: Payer: Medicare Other

## 2021-09-03 ENCOUNTER — Encounter (HOSPITAL_COMMUNITY): Payer: Medicare Other

## 2021-09-07 ENCOUNTER — Ambulatory Visit (INDEPENDENT_AMBULATORY_CARE_PROVIDER_SITE_OTHER): Payer: Medicare Other | Admitting: Cardiology

## 2021-09-07 ENCOUNTER — Ambulatory Visit: Payer: Medicare Other | Admitting: Cardiology

## 2021-09-07 VITALS — BP 126/72 | HR 82 | Ht 70.6 in | Wt 196.1 lb

## 2021-09-07 DIAGNOSIS — I10 Essential (primary) hypertension: Secondary | ICD-10-CM

## 2021-09-07 DIAGNOSIS — I722 Aneurysm of renal artery: Secondary | ICD-10-CM

## 2021-09-07 DIAGNOSIS — E119 Type 2 diabetes mellitus without complications: Secondary | ICD-10-CM

## 2021-09-07 DIAGNOSIS — E7800 Pure hypercholesterolemia, unspecified: Secondary | ICD-10-CM | POA: Diagnosis not present

## 2021-09-07 DIAGNOSIS — I2510 Atherosclerotic heart disease of native coronary artery without angina pectoris: Secondary | ICD-10-CM

## 2021-09-07 NOTE — Progress Notes (Signed)
?Cardiology Office Note:   ? ?Date:  09/07/2021  ? ?ID:  Logan Hobbs, DOB 29-Sep-1934, MRN [id number] ? ?PCP:  Logan Stalker, PA-C  ?Cardiologist:  Logan Lindau, MD  ? ?Referring MD: Logan Stalker, PA-C  ? ? ?ASSESSMENT:   ? ?1. Coronary artery disease without angina pectoris, unspecified vessel or lesion type, unspecified whether native or transplanted heart   ?2. Pure hypercholesterolemia   ?3. Aneurysm of renal artery (HCC)   ?4. Diabetes mellitus with coincident hypertension ([place])   ?5. Type 2 diabetes mellitus without complication, without long-term current use of insulin ([place])   ? ?PLAN:   ? ?In order of problems listed above: ? ?Coronary artery disease: Secondary prevention stressed with patient.  Importance of compliance with diet and medication stressed and he vocalized understanding.  He was advised to ambulate to the best of his ability. ?Essential hypertension: Blood pressure stable and diet was emphasized.  Lifestyle modification urged. ?Mixed dyslipidemia: On lipid-lowering medications and lipids were reviewed from [facility].  They are doing well. ?Diabetes mellitus: Followed by primary care doctor.  Patient is cognizant about diet and is doing well with this.  This is managed by primary care. ?Patient will be seen in follow-up appointment in 12 months or earlier if the patient has any concerns ? ? ? ?Medication Adjustments/Labs and Tests Ordered: ?Current medicines are reviewed at length with the patient today.  Concerns regarding medicines are outlined above.  ?No orders of the defined types were placed in this encounter. ? ?No orders of the defined types were placed in this encounter. ? ? ? ?No chief complaint on file. ?  ? ?History of Present Illness:   ? ?Logan Hobbs is a 87 y.o. male.  Patient has past medical history of coronary artery disease, essential hypertension, dyslipidemia and diabetes mellitus.  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  He is attending is very supportive.  Patient denies any problems at this time and takes care of activities of daily living.  No chest pain orthopnea or PND.  At the time of my evaluation, the patient is alert awake oriented and in no distress. ? ?Past Medical History:  ?Diagnosis Date  ? Abrasion 02/10/2021  ? Dec 25, 2020 Entered By: Ree Kida Comment: Status post fall  ? Allergic rhinitis 02/02/2011  ? Allergic rhinitis due to pollen 02/26/2021  ? Aneurysm of renal artery ([place]) 02/26/2021  ? Anxiety 12/13/2012  ? Medication  prescribedby the [place]; on Xanax when necessary.  ? Atherosclerosis of both carotid arteries 02/26/2021  ? Benign localized prostatic hyperplasia without lower urinary tract symptoms (LUTS) 09/13/2016  ? IMO 2019 R1.0 Update  ? 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 Blood pressure alteration 12/15/2016  ? Cancer ([place]) 05/16/2018  ? 08/2007 40% prostate removed for BPH/ stones and cancer found coincidentally. Followed by Dr. Farris Has at [facility].  ? Cataract 09/18/2020  ? Change in bowel habit 09/02/2021  ? Chronic idiopathic constipation 02/26/2021  ? Cognitive disorder 02/10/2021  ? Colon polyp 02/13/2012  ? Dec. 16, 2011 and next one due 5 years  ? Constipation 09/18/2020  ? Diabetes mellitus with coincident hypertension ([place]) 03/31/2017  ? Diabetes mellitus with neuropathy ([place]) 02/14/2013  ? Diabetic peripheral neuropathy associated with type 2 diabetes mellitus ([place]) 02/26/2021  ? Diarrhea 09/02/2021  ? Dizziness   ? Encounter for fitting and adjustment  of hearing aid 09/18/2020  ? Essential hypertension 05/16/2018  ? Falls   ? Gait abnormality 03/15/2015  ? Followed by Dr. Yolonda Kida with [facility]. He does have neuropathy. Dr. Audley Hose note 07/03/14 says etiology of apractic gait unclear. Ongoing neurology follow up.  ? Gout 02/26/2021  ? Hardening of the aorta (main artery of the heart) ([place])  02/26/2021  ? Hearing loss 05/16/2018  ? History of fall 02/10/2021  ? History of malignant neoplasm of prostate 02/26/2021  ? Hyperlipidemia 05/16/2018  ? Impacted cerumen, bilateral 09/18/2020  ? Intraventricular hemorrhage ([place]) 05/06/2021  ? Lumbar degenerative disc disease 03/15/2015  ? Mild multilevel degenerative disc changes on MRI of the lumbar spine ordered by Dr. Yolonda Kida, done 06/15/14  ? Male erectile dysfunction, unspecified 09/13/2016  ? Malignant neoplasm of prostate ([place]) 10/26/2010  ? Need for prophylactic vaccination and inoculation against influenza 09/18/2020  ? Neuropathy 06/20/2014  ? Obesity 09/18/2020  ? Onychomycosis 05/16/2018  ? Osteoarthritis 05/17/2013  ? Left shoulder  ? Other abnormalities of gait and mobility 02/10/2021  ? Pre-operative cardiovascular examination 05/16/2018  ? Pure hypercholesterolemia 02/26/2021  ? Rash and other nonspecific skin eruption 09/18/2020  ? Recurrent falls 02/26/2021  ? Ringing in ears 09/18/2020  ? Senile nuclear sclerosis 01/30/2013  ? Sensorineural hearing loss, bilateral 09/18/2020  ? Syncope and collapse 12/21/2020  ? Tinea cruris 05/16/2018  ? Tremor 02/10/2021  ? Trochanteric bursitis of right hip 05/28/2019  ? Type 2 diabetes mellitus without complications ([place]) 33/29/5188  ? Vertigo 05/16/2018  ? Vitamin D insufficiency 05/27/2013  ? ? ?Past Surgical History:  ?Procedure Laterality Date  ? BLADDER SURGERY    ? bladder neck contracture repair  ? CATARACT EXTRACTION Left 2014  ? PROSTATE SURGERY  2009  ? TONSILLECTOMY    ? VASECTOMY  1978  ? WISDOM TOOTH EXTRACTION    ? ? ?Current Medications: ?Current Meds  ?Medication Sig  ? acetaminophen (TYLENOL) 500 MG tablet Take 1,000 mg by mouth in the morning and at bedtime.  ? Cholecalciferol (D3-1000 PO) Take 2,000 mg by mouth every evening.  ? cycloSPORINE (RESTASIS) 0.05 % ophthalmic emulsion Place 1 drop into both eyes 2 (two) times daily.  ? escitalopram (LEXAPRO) 5 MG tablet Take 5 mg by mouth every  morning.  ? losartan (COZAAR) 50 MG tablet Take 50 mg by mouth daily.  ? ondansetron (ZOFRAN-ODT) 8 MG disintegrating tablet Take 1 tablet (8 mg total) by mouth every 8 (eight) hours as needed for nausea or vomiting.  ? Polyethylene Glycol 3350 (MIRALAX PO) Take 1 Dose by mouth 4 (four) times a week.  ? rosuvastatin (CRESTOR) 10 MG tablet Take 10 mg by mouth every morning.  ? sitaGLIPtin-metformin (JANUMET) 50-1000 MG tablet Take 1 tablet by mouth 2 (two) times daily.  ? vitamin B-12 (CYANOCOBALAMIN) 1000 MCG tablet Take 1,000 mcg by mouth daily.  ?  ? ?Allergies:   Latex, Ciprofloxacin, Seasonal ic [cholestatin], Solifenacin succinate, and Tamsulosin hcl  ? ?Social History  ? ?Socioeconomic History  ? Marital status: Married  ?  Spouse name: Not on file  ? Number of children: 1  ? Years of education: Not on file  ? Highest education level: Master's degree (e.g., MA, MS, MEng, MEd, MSW, MBA)  ?Occupational History  ?  Comment: retired Chiropodist  ?Tobacco Use  ? Smoking status: Never  ? Smokeless tobacco: Never  ?Substance and Sexual Activity  ? Alcohol use: Not Currently  ?  Alcohol/week: 7.0 standard drinks  ?  Types: 7 Cans of beer per week  ? Drug use: Never  ? Sexual activity: Not on file  ?Other Topics Concern  ? Not on file  ?Social History Narrative  ? 06/09/21 lives at [facility] [place]   ? Caffeine   ? ?Social Determinants of Health  ? ?Financial Resource Strain: Not on file  ?Food Insecurity: Not on file  ?Transportation Needs: Not on file  ?Physical Activity: Not on file  ?Stress: Not on file  ?Social Connections: Not on file  ?  ? ?Family History: ?The patient's family history includes Cancer in his brother; Heart disease in his brother and father; Hypertension in his father. There is no history of Diabetes. ? ?ROS:   ?Please see the history of present illness.    ?All other systems reviewed and are negative. ? ?EKGs/Labs/Other Studies Reviewed:   ? ?The following studies were reviewed today: ?I  discussed my findings with the patient at length. ? ? ?Recent Labs: ?12/21/2020: TSH 0.647 ?12/22/2020: Magnesium 2.1 ?05/06/2021: ALT 13; BUN 14; Creatinine, Ser 0.90; Hemoglobin 14.5; Platelets 246; Potassium 4

## 2021-09-07 NOTE — Patient Instructions (Addendum)

## 2021-09-22 ENCOUNTER — Ambulatory Visit: Payer: Medicare Other | Attending: Otolaryngology

## 2021-09-22 DIAGNOSIS — R262 Difficulty in walking, not elsewhere classified: Secondary | ICD-10-CM | POA: Diagnosis present

## 2021-09-22 DIAGNOSIS — R2681 Unsteadiness on feet: Secondary | ICD-10-CM | POA: Insufficient documentation

## 2021-09-22 DIAGNOSIS — R42 Dizziness and giddiness: Secondary | ICD-10-CM | POA: Diagnosis present

## 2021-09-22 DIAGNOSIS — R293 Abnormal posture: Secondary | ICD-10-CM | POA: Diagnosis present

## 2021-09-22 DIAGNOSIS — R2689 Other abnormalities of gait and mobility: Secondary | ICD-10-CM | POA: Insufficient documentation

## 2021-09-22 NOTE — Therapy (Signed)
OUTPATIENT PHYSICAL THERAPY VESTIBULAR EVALUATION     Patient Name: Logan Hobbs MRN: [id number] DOB:[birth year], 87 y.o., male Today's Date: 09/22/2021  PCP: Marda Stalker, PA-C REFERRING PROVIDER: Leta Baptist, MD   PT End of Session - 09/22/21 1419     Visit Number 1    Number of Visits 17    Date for PT Re-Evaluation 11/19/21    Authorization Type Medicare A+B/BCBS    PT Start Time 1416   patient arriving late   PT Stop Time 1450    PT Time Calculation (min) 34 min    Activity Tolerance Patient tolerated treatment well    Behavior During Therapy [facility] for tasks assessed/performed             Past Medical History:  Diagnosis Date   Abrasion 02/10/2021   Dec 25, 2020 Entered By: Ree Kida Comment: Status post fall   Allergic rhinitis 02/02/2011   Allergic rhinitis due to pollen 02/26/2021   Aneurysm of renal artery ([place]) 02/26/2021   Anxiety 12/13/2012   [facility] when necessary.   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 Followed by Dr. Farris Has at [facility].   Cataract 09/18/2020   Change in bowel habit 09/02/2021   Chronic idiopathic constipation 02/26/2021   Cognitive disorder 02/10/2021   Colon polyp 02/13/2012   Dec. 16, 2011 and next one due 5 years   Constipation 09/18/2020   Diabetes mellitus with coincident hypertension ([place]) 03/31/2017   Diabetes mellitus with neuropathy ([place]) 02/14/2013   Diabetic peripheral neuropathy associated with type 2 diabetes mellitus ([place]) 02/26/2021   Diarrhea 09/02/2021   Dizziness    Encounter for fitting and adjustment of  hearing aid 09/18/2020   Essential hypertension 05/16/2018   Falls    Gait abnormality 03/15/2015   Followed by Dr. Yolonda Kida with [facility]. He does have neuropathy. Dr. Audley Hose note 07/03/14 says etiology of apractic gait unclear. Ongoing neurology follow up.   Gout 02/26/2021   Hardening of the aorta (main artery of the heart) ([place]) 02/26/2021   Hearing loss 05/16/2018   History of fall 02/10/2021   History of malignant neoplasm of prostate 02/26/2021   Hyperlipidemia 05/16/2018   Impacted cerumen, bilateral 09/18/2020   Intraventricular hemorrhage ([place]) 05/06/2021   Lumbar degenerative disc disease 03/15/2015   Mild multilevel degenerative disc changes on MRI of the lumbar spine ordered by Dr. Yolonda Kida, done 06/15/14   Male erectile dysfunction, unspecified 09/13/2016   Malignant neoplasm of prostate ([place]) 10/26/2010   Need for prophylactic vaccination and inoculation against influenza 09/18/2020   Neuropathy 06/20/2014   Obesity 09/18/2020   Onychomycosis 05/16/2018   Osteoarthritis 05/17/2013   Left shoulder   Other abnormalities of gait and mobility 02/10/2021   Pre-operative cardiovascular examination 05/16/2018   Pure hypercholesterolemia 02/26/2021   Rash and other nonspecific skin eruption 09/18/2020   Recurrent falls 02/26/2021   Ringing in ears 09/18/2020   Senile nuclear sclerosis 01/30/2013   Sensorineural hearing loss, bilateral 09/18/2020   Syncope and collapse 12/21/2020   Tinea cruris 05/16/2018   Tremor 02/10/2021   Trochanteric bursitis of right hip 05/28/2019   Type 2 diabetes mellitus without  complications ([place]) 70/17/7939   Vertigo 05/16/2018   Vitamin D insufficiency 05/27/2013   Past Surgical History:  Procedure Laterality Date   BLADDER SURGERY     bladder neck contracture repair   CATARACT EXTRACTION Left 2014   PROSTATE SURGERY  2009   TONSILLECTOMY     VASECTOMY  1978   WISDOM TOOTH EXTRACTION     Patient Active Problem List   Diagnosis  Date Noted   Dizziness 09/02/2021   Falls 09/02/2021   Change in bowel habit 09/02/2021   Diarrhea 09/02/2021   Intraventricular hemorrhage ([place]) 05/06/2021   Allergic rhinitis due to pollen 02/26/2021   Aneurysm of renal artery ([place]) 02/26/2021   Atherosclerosis of both carotid arteries 02/26/2021   Chronic idiopathic constipation 02/26/2021   Diabetic peripheral neuropathy associated with type 2 diabetes mellitus ([place]) 02/26/2021   Gout 02/26/2021   Hardening of the aorta (main artery of the heart) ([place]) 02/26/2021   History of malignant neoplasm of prostate 02/26/2021   Pure hypercholesterolemia 02/26/2021   Recurrent falls 02/26/2021   Abrasion 02/10/2021   Cognitive disorder 02/10/2021   History of fall 02/10/2021   Other abnormalities of gait and mobility 02/10/2021   Tremor 02/10/2021   Syncope and collapse 12/21/2020   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   Cataract 09/18/2020   Constipation 09/18/2020   Encounter for fitting and adjustment of hearing aid 09/18/2020   Impacted cerumen, bilateral 09/18/2020   Need for prophylactic vaccination and inoculation against influenza 09/18/2020   Obesity 09/18/2020   Rash and other nonspecific skin eruption 09/18/2020   Ringing in ears 09/18/2020   Sensorineural hearing loss, bilateral 09/18/2020   Type 2 diabetes mellitus without complications ([place]) 03/00/9233   Trochanteric bursitis of right hip 05/28/2019   Cancer ([place]) 05/16/2018   Hearing loss 05/16/2018   Hyperlipidemia 05/16/2018   Onychomycosis 05/16/2018   Tinea cruris 05/16/2018   Vertigo 05/16/2018   Pre-operative cardiovascular examination 05/16/2018   Essential hypertension 05/16/2018   Diabetes mellitus with coincident hypertension ([place]) 03/31/2017   Blood pressure alteration 12/15/2016   Benign localized prostatic hyperplasia without lower urinary tract symptoms (LUTS) 09/13/2016   Male erectile dysfunction, unspecified 09/13/2016   Bladder neck  obstruction 04/04/2016   Gait abnormality 03/15/2015   Lumbar degenerative disc disease 03/15/2015   Neuropathy 06/20/2014   Vitamin D insufficiency 05/27/2013   Osteoarthritis 05/17/2013   Diabetes mellitus with neuropathy ([place]) 02/14/2013   Senile nuclear sclerosis 01/30/2013   Anxiety 12/13/2012   Colon polyp 02/13/2012   Allergic rhinitis 02/02/2011   Malignant neoplasm of prostate ([place]) 10/26/2010    ONSET DATE: 09/15/2021  REFERRING DIAG: R42 (ICD-10-CM) - Dizziness  THERAPY DIAG:  Dizziness and giddiness  Unsteadiness on feet  Abnormal posture  Difficulty in walking, not elsewhere classified  Other abnormalities of gait and mobility  Rationale for Evaluation and Treatment Rehabilitation  SUBJECTIVE:   SUBJECTIVE STATEMENT: Patient reports that the dizziness and then began to have a series of fall. No recent falls, but reports approx 6 in the last few months. Patient reports he feels the dizziness during the day when he is up and moving. Reports he notices it with sudden movement. Has had prior episodes of vertigo with associated nausea. Reports he feels lightheaded pretty much all the time, but then have bursts of dizziness. Denies dizziness with bed mobility. Has chronic tinnitus. Does reports that he has some weakness in the legs and endurance. Reports some generally imbalanced. Reports started using the RW since all of  the falls. Patient reports that he does have history of neuropathy in BLE's.  Pt accompanied by:  caregiver  PERTINENT HISTORY: PMH includes multiple falls with + head trauma, hypertension, diabetes, hyperlipidemia, prostate cancer, anxiety. CT (January)  revealed mild layering of hyperdense blood within occipital horns of bilat lateral ventricles, no IPH/SDH/SAH; IVH favored secondary to HTN.  PAIN:  Are you having pain?  Chronic Pain in Knees.   PRECAUTIONS: Fall  WEIGHT BEARING RESTRICTIONS No  FALLS: Has patient fallen in last 6 months? Yes.  Number of falls 6  LIVING ENVIRONMENT: Lives with: lives with their family Lives in: Other Lives in ALF Has elevator; lives on the first floor Has following equipment at home: Gilford Rile - 2 wheeled and transport chair ;   PLOF: Independent with household mobility with device, Needs assistance with gait, and Needs assistance with transfers  PATIENT GOALS Improve Dizziness. Improve Balance  OBJECTIVE:   DIAGNOSTIC FINDINGS:   IMPRESSION: 1. No acute intracranial abnormality or mass. 2. Interval resolution of intraventricular hemorrhage. 3. Mild chronic small vessel ischemic disease and moderately advanced cerebral atrophy.   SENSATION: History of neuropathy in BLE; reports numbness/tingling. Decreased sensation noted. Reports can feel hot/cold  POSTURE: rounded shoulders and forward head   STRENGTH: General weakness noted in BLE with functional mobility, no formal measurements taken today.  TRANSFERS: Assistive device utilized: Environmental consultant - 2 wheeled  Sit to stand: CGA Stand to sit: CGA  GAIT: Gait pattern: step through pattern, decreased arm swing- Right, decreased arm swing- Left, decreased step length- Right, decreased step length- Left, trunk flexed, poor foot clearance- Right, and poor foot clearance- Left Distance walked: clinic distance Assistive device utilized: Walker - 2 wheeled Level of assistance: CGA Comments: CGA due to unsteadiness  PATIENT SURVEYS:  DPS: 43% and DFS: 39.7%    VESTIBULAR ASSESSMENT     SYMPTOM BEHAVIOR:   Subjective history: See Subjective   Non-Vestibular symptoms: tinnitus   Type of dizziness: Imbalance (Disequilibrium), Spinning/Vertigo, Unsteady with head/body turns, and Lightheadedness/Faint   Frequency: daily   Duration: intermittent; duration of the movement   Aggravating factors: Induced by motion: looking up at the ceiling, turning body quickly, and turning head quickly   Relieving factors: slow movements   Progression of  symptoms: worse   OCULOMOTOR EXAM:   Ocular Alignment: normal   Ocular ROM:  decreased range of motion noted with L lateral tracking. Cues required for proper completion but still continue to not lateral track with horizontal smooth pursuits.    Spontaneous Nystagmus: absent   Gaze-Induced Nystagmus: absent   Smooth Pursuits:  no dizziness; difficulty tracking laterally to the L    Saccades: hypometric/undershoots and slow     VESTIBULAR - OCULAR REFLEX:    Slow VOR: Comment: increased challenge maintaining eyes focused; mild dizziness   VOR Cancellation: Normal   Head-Impulse Test: unable to test due    Dynamic Visual Acuity:  will be assessed at future visit  MOTION SENSITIVITY:    Motion Sensitivity Quotient  Intensity: 0 = none, 1 = Lightheaded, 2 = Mild, 3 = Moderate, 4 = Severe, 5 = Vomiting  Intensity  1. Sitting to supine   2. Supine to L side   3. Supine to R side   4. Supine to sitting   5. L Hallpike-Dix   6. Up from L    7. R Hallpike-Dix   8. Up from R    9. Sitting, head  tipped to L knee 2  10.  Head up from L  knee 1  11. Sitting, head  tipped to R knee 2  12. Head up from R  knee 1  13. Sitting head turns x5 0 (reports feels like eyes are not transitioning smoothly)  14.Sitting head nods x5 0 (reports feels like eyes are not transitioning smoothly)  15. In stance, 180  turn to L    16. In stance, 180  turn to R     PATIENT EDUCATION: Education details: Educated on [employer] Person educated: Patient and Caregiver Education method: Explanation Education comprehension: verbalized understanding   GOALS: Goals reviewed with patient? Yes  SHORT TERM GOALS: Target date: 10/22/2021   Pt will be independent with initial HEP for improved balance and gaze stabilization  Baseline: no HEP established Goal status: INITIAL  2.  Berg Balance TBA and LTG to be set as applicable Baseline: TBA Goal status: INITIAL  3.  DVA TBA and LTG to be set  as applicable Baseline: TBA Goal status: INITIAL  LONG TERM GOALS: Target date: 11/19/2021    Pt will be independent with final HEP for improved balance and gaze stabilization Baseline: to be established Goal status: INITIAL  2.  Pt will improve Berg Balance by 5 points from baseline to demonstrate improved standing balance and reduced fall risk Baseline: TBA Goal status: INITIAL  3.  Pt will report </= 1/5 for all movements on MSQ to indicate improvement in motion sensitivity and improved activity tolerance.  Baseline: 2-3/5 Goal status: INITIAL  4.  LTG to be set for DVA as applicable Baseline: TBA Goal status: INITIAL  5.  Pt will be able to ambulate >/= 500 ft with LRAD and supervision Baseline: CGA with ambulation  Goal status: INITIAL  6.  Pt will improve DPS to >/= 53% Baseline: 46% Goal status: INITIAL  ASSESSMENT:  CLINICAL IMPRESSION: Patient is a 87 y.o. male referred to Neuro OPPT services for Dizziness. Patient's PMH significant for the following: multiple falls with + head trauma, hypertension, diabetes, hyperlipidemia, prostate cancer, anxiety. Upon evaluation, patient presents with the following impairments: abnormal posture, impaired sensation, decreased strength, abnormal gait, decreased balance, dizziness, and abnormal oculomotor exam. Pt is currently ambulating with a RW at CGA level. Patient will benefit from skilled PT services to address impairments.     OBJECTIVE IMPAIRMENTS Abnormal gait, decreased activity tolerance, decreased balance, decreased endurance, decreased knowledge of use of DME, difficulty walking, decreased strength, decreased safety awareness, dizziness, impaired sensation, postural dysfunction, and pain.   ACTIVITY LIMITATIONS lifting, bending, standing, stairs, transfers, and locomotion level  PARTICIPATION LIMITATIONS: cleaning, medication management, and community activity  PERSONAL FACTORS Age, Time since onset of  injury/illness/exacerbation, and 3+ comorbidities: multiple falls with + head trauma, hypertension, diabetes, hyperlipidemia, prostate cancer, anxiety  are also affecting patient's functional outcome.   REHAB POTENTIAL: Good  CLINICAL DECISION MAKING: Evolving/moderate complexity  EVALUATION COMPLEXITY: Moderate   PLAN: PT FREQUENCY: 2x/week  PT DURATION: 8 weeks  PLANNED INTERVENTIONS: Therapeutic exercises, Therapeutic activity, Neuromuscular re-education, Balance training, Gait training, Patient/Family education, Joint manipulation, Joint mobilization, Stair training, Vestibular training, Canalith repositioning, Orthotic/Fit training, DME instructions, Electrical stimulation, Cryotherapy, Moist heat, and Manual therapy  PLAN FOR NEXT SESSION: Assess Berg Balance, Gait Speed. Initiate HEP focused on smooth pursuits, slow VOR.   Jones Bales, PT, DPT 09/22/2021, 3:03 PM

## 2021-10-04 ENCOUNTER — Ambulatory Visit: Payer: Medicare Other | Attending: Otolaryngology

## 2021-10-04 DIAGNOSIS — R293 Abnormal posture: Secondary | ICD-10-CM | POA: Diagnosis present

## 2021-10-04 DIAGNOSIS — R2681 Unsteadiness on feet: Secondary | ICD-10-CM | POA: Insufficient documentation

## 2021-10-04 DIAGNOSIS — R2689 Other abnormalities of gait and mobility: Secondary | ICD-10-CM | POA: Diagnosis present

## 2021-10-04 DIAGNOSIS — R42 Dizziness and giddiness: Secondary | ICD-10-CM | POA: Diagnosis present

## 2021-10-04 DIAGNOSIS — R262 Difficulty in walking, not elsewhere classified: Secondary | ICD-10-CM | POA: Insufficient documentation

## 2021-10-04 NOTE — Therapy (Signed)
OUTPATIENT PHYSICAL THERAPY VESTIBULAR TREATMENT NOTE   Patient Name: Logan Hobbs MRN: [id number] DOB:[birth year], 87 y.o., male Today's Date: 10/04/2021  PCP: Marda Stalker, PA-C REFERRING PROVIDER: Leta Baptist, MD   PT End of Session - 10/04/21 0933     Visit Number 2    Number of Visits 17    Date for PT Re-Evaluation 11/19/21    Authorization Type Medicare A+B/BCBS    PT Start Time 0933    PT Stop Time 1505    PT Time Calculation (min) 42 min    Activity Tolerance Patient tolerated treatment well    Behavior During Therapy [facility] for tasks assessed/performed             Past Medical History:  Diagnosis Date   Abrasion 02/10/2021   Dec 25, 2020 Entered By: Ree Kida Comment: Status post fall   Allergic rhinitis 02/02/2011   Allergic rhinitis due to pollen 02/26/2021   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 Followed by Dr. Farris Has at [facility].   Cataract 09/18/2020   Change in bowel habit 09/02/2021   Chronic idiopathic constipation 02/26/2021   Cognitive disorder 02/10/2021   Colon polyp 02/13/2012   Dec. 16, 2011 and next one due 5 years   Constipation 09/18/2020   Diabetes mellitus with coincident hypertension ([place]) 03/31/2017   Diabetes mellitus with neuropathy ([place]) 02/14/2013   Diabetic peripheral neuropathy associated with type 2 diabetes mellitus ([place]) 02/26/2021   Diarrhea 09/02/2021   Dizziness    Encounter for fitting and adjustment of hearing aid 09/18/2020    Essential hypertension 05/16/2018   Falls    Gait abnormality 03/15/2015   Followed by Dr. Yolonda Kida with [facility]. He does have neuropathy. Dr. Audley Hose note 07/03/14 says etiology of apractic gait unclear. Ongoing neurology follow up.   Gout 02/26/2021   Hardening of the aorta (main artery of the heart) ([place]) 02/26/2021   Hearing loss 05/16/2018   History of fall 02/10/2021   History of malignant neoplasm of prostate 02/26/2021   Hyperlipidemia 05/16/2018   Impacted cerumen, bilateral 09/18/2020   Intraventricular hemorrhage ([place]) 05/06/2021   Lumbar degenerative disc disease 03/15/2015   Mild multilevel degenerative disc changes on MRI of the lumbar spine ordered by Dr. Yolonda Kida, done 06/15/14   Male erectile dysfunction, unspecified 09/13/2016   Malignant neoplasm of prostate ([place]) 10/26/2010   Need for prophylactic vaccination and inoculation against influenza 09/18/2020   Neuropathy 06/20/2014   Obesity 09/18/2020   Onychomycosis 05/16/2018   Osteoarthritis 05/17/2013   Left shoulder   Other abnormalities of gait and mobility 02/10/2021   Pre-operative cardiovascular examination 05/16/2018   Pure hypercholesterolemia 02/26/2021   Rash and other nonspecific skin eruption 09/18/2020   Recurrent falls 02/26/2021   Ringing in ears 09/18/2020   Senile nuclear sclerosis 01/30/2013   Sensorineural hearing loss, bilateral 09/18/2020   Syncope and collapse 12/21/2020   Tinea cruris 05/16/2018   Tremor 02/10/2021   Trochanteric bursitis of right hip 05/28/2019   Type 2 diabetes mellitus without complications ([place]) 69/79/4801  Vertigo 05/16/2018   Vitamin D insufficiency 05/27/2013   Past Surgical History:  Procedure Laterality Date   BLADDER SURGERY     bladder neck contracture repair   CATARACT EXTRACTION Left 2014   PROSTATE SURGERY  2009   TONSILLECTOMY     VASECTOMY  1978   WISDOM TOOTH EXTRACTION     Patient Active Problem List   Diagnosis Date Noted   Dizziness  09/02/2021   Falls 09/02/2021   Change in bowel habit 09/02/2021   Diarrhea 09/02/2021   Intraventricular hemorrhage ([place]) 05/06/2021   Allergic rhinitis due to pollen 02/26/2021   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   Atherosclerosis of both carotid arteries 02/26/2021   Chronic idiopathic constipation 02/26/2021   Diabetic peripheral neuropathy associated with type 2 diabetes mellitus ([place]) 02/26/2021   Gout 02/26/2021   Hardening of the aorta (main artery of the heart) ([place]) 02/26/2021   History of malignant neoplasm of prostate 02/26/2021   Pure hypercholesterolemia 02/26/2021   Recurrent falls 02/26/2021   Abrasion 02/10/2021   Cognitive disorder 02/10/2021   History of fall 02/10/2021   Other abnormalities of gait and mobility 02/10/2021   Tremor 02/10/2021   Syncope and collapse 12/21/2020   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   Cataract 09/18/2020   Constipation 09/18/2020   Encounter for fitting and adjustment of hearing aid 09/18/2020   Impacted cerumen, bilateral 09/18/2020   Need for prophylactic vaccination and inoculation against influenza 09/18/2020   Obesity 09/18/2020   Rash and other nonspecific skin eruption 09/18/2020   Ringing in ears 09/18/2020   Sensorineural hearing loss, bilateral 09/18/2020   Type 2 diabetes mellitus without complications ([place]) 09/98/3382   Trochanteric bursitis of right hip 05/28/2019   Cancer ([place]) 05/16/2018   Hearing loss 05/16/2018   Hyperlipidemia 05/16/2018   Onychomycosis 05/16/2018   Tinea cruris 05/16/2018   Vertigo 05/16/2018   Pre-operative cardiovascular examination 05/16/2018   Essential hypertension 05/16/2018   Diabetes mellitus with coincident hypertension ([place]) 03/31/2017   Blood pressure alteration 12/15/2016   Benign localized prostatic hyperplasia without lower urinary tract symptoms (LUTS) 09/13/2016   Male erectile dysfunction, unspecified 09/13/2016   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/04/2016    Gait abnormality 03/15/2015   Lumbar degenerative disc disease 03/15/2015   Neuropathy 06/20/2014   Vitamin D insufficiency 05/27/2013   Osteoarthritis 05/17/2013   Diabetes mellitus with neuropathy ([place]) 02/14/2013   Senile nuclear sclerosis 01/30/2013   Anxiety 12/13/2012   Colon polyp 02/13/2012   Allergic rhinitis 02/02/2011   Malignant neoplasm of prostate ([place]) 10/26/2010    ONSET DATE: 09/15/2021  REFERRING DIAG: R42 (ICD-10-CM) - Dizziness  THERAPY DIAG:  Dizziness and giddiness  Unsteadiness on feet  Abnormal posture  Difficulty in walking, not elsewhere classified  Other abnormalities of gait and mobility  Rationale for Evaluation and Treatment Rehabilitation  PERTINENT HISTORY: PMH includes multiple falls with + head trauma, hypertension, diabetes, hyperlipidemia, prostate cancer, anxiety. CT (January)  revealed mild layering of hyperdense blood within occipital horns of bilat lateral ventricles, no IPH/SDH/SAH; IVH favored secondary to HTN  PRECAUTIONS: Fall  SUBJECTIVE: Reports no new changes/complaints. Just been busy. Reports he has some issues steering the RW. Caregiver reports he has not been standing up tall when walking, hunched forward.  PAIN:  Are you having pain? No  OBJECTIVE:   [facility] PT Assessment - 10/04/21 0001       Standardized Balance Assessment   Standardized Balance Assessment Berg Balance Test      [place] Balance  Test   Sit to Stand Able to stand  independently using hands    Standing Unsupported Able to stand 2 minutes with supervision    Sitting with Back Unsupported but Feet Supported on Floor or Stool Able to sit safely and securely 2 minutes    Stand to Sit Controls descent by using hands    Transfers Able to transfer safely, definite need of hands    Standing Unsupported with Eyes Closed Able to stand 10 seconds with supervision    Standing Unsupported with Feet Together Able to place feet together independently and stand for 1  minute with supervision    From Standing, Reach Forward with Outstretched Arm Can reach forward >12 cm safely (5")   6"   From Standing Position, Pick up Object from [place] to pick up shoe, needs supervision    From Standing Position, Turn to Look Behind Over each Shoulder Turn sideways only but maintains balance    Turn 360 Degrees Needs close supervision or verbal cueing    Standing Unsupported, Alternately Place Feet on Step/Stool Able to complete 4 steps without aid or supervision    Standing Unsupported, One Foot in Front Able to take small step independently and hold 30 seconds    Standing on One Leg Tries to lift leg/unable to hold 3 seconds but remains standing independently    Total Score 36    Berg comment: 36/56             GAIT: Gait pattern: step through pattern, decreased arm swing- Right, decreased arm swing- Left, decreased step length- Right, decreased step length- Left, trunk flexed, poor foot clearance- Right, and poor foot clearance- Left Distance walked: into/out of session with RW. Cues for standing up tall with ambulation Assistive device utilized: Walker - 2 wheeled Level of assistance: CGA    VESTIBULAR TREATMENT:  Gaze Adaptation: x1 Viewing Horizontal: Position: Seated, Time: 30 seconds, Reps: 2, and Comment: mild dizziness and x1 Viewing Vertical:  Position: Seated, Time: 30 seconds, Reps: 2, and Comment: mild dizziness, some blurred vision with vertical. Cues to maintain eyes focused on target.   Smooth Pursuits: Completed horizontal/vertical smooth pursuits, with mod cues required for proper completion. Completed x 10 reps horizontal, and x 10 reps vertical. Mild Dizziness. Decreased visual tracking in L visual field noted.   Provided the Following HEP:  Oculomotor: Smooth Pursuits    Holding a target, keep eyes on target and slowly move target side to side with head still. Completed 10 times. Then keep eyes on target and slowly move target up and  down with stead still completed 10 times. Perform sitting. Do 2 sessions per day.    Gaze Stabilization: Sitting    Keeping eyes on target on wall 3-4 feet away, tilt head down 15-30 and move head side to side for 30 seconds. Repeat while moving head up and down for 30 seconds. Do 2 sessions per day.   PATIENT EDUCATION: Education details: Civil engineer, contracting; Initial HEP Person educated: Patient and Caregiver Education method: Explanation Education comprehension: verbalized understanding     GOALS: Goals reviewed with patient? Yes   SHORT TERM GOALS: Target date: 10/22/2021    Pt will be independent with initial HEP for improved balance and gaze stabilization  Baseline: no HEP established Goal status: INITIAL   2.  Berg Balance TBA and LTG to be set as applicable Baseline: TBA; Assessed on 10/04/21 Goal status: MET   3.  DVA TBA and LTG to be  set as applicable Baseline: TBA Goal status: INITIAL   LONG TERM GOALS: Target date: 11/19/2021     Pt will be independent with final HEP for improved balance and gaze stabilization Baseline: to be established Goal status: INITIAL   2.  Pt will improve Berg Balance by 5 points from baseline to demonstrate improved standing balance and reduced fall risk Baseline: TBA; 36/56 Goal status: INITIAL   3.  Pt will report </= 1/5 for all movements on MSQ to indicate improvement in motion sensitivity and improved activity tolerance.  Baseline: 2-3/5 Goal status: INITIAL   4.  LTG to be set for DVA as applicable Baseline: TBA Goal status: INITIAL   5.  Pt will be able to ambulate >/= 500 ft with LRAD and supervision Baseline: CGA with ambulation  Goal status: INITIAL   6.  Pt will improve DPS to >/= 53% Baseline: 46% Goal status: INITIAL   ASSESSMENT:   CLINICAL IMPRESSION: Today's skilled PT session included further balance assessment with patient scoring 36/56 demonstrating high fall risk at this time. Rest of session spent  establishing initial HEP focused on gaze stabilization and smooth pursuits. Patient tolerating well, but decreased visual tracking noted in L visual field. Will continue per POC.      OBJECTIVE IMPAIRMENTS Abnormal gait, decreased activity tolerance, decreased balance, decreased endurance, decreased knowledge of use of DME, difficulty walking, decreased strength, decreased safety awareness, dizziness, impaired sensation, postural dysfunction, and pain.    ACTIVITY LIMITATIONS lifting, bending, standing, stairs, transfers, and locomotion level   PARTICIPATION LIMITATIONS: cleaning, medication management, and community activity   PERSONAL FACTORS Age, Time since onset of injury/illness/exacerbation, and 3+ comorbidities: multiple falls with + head trauma, hypertension, diabetes, hyperlipidemia, prostate cancer, anxiety  are also affecting patient's functional outcome.    REHAB POTENTIAL: Good   CLINICAL DECISION MAKING: Evolving/moderate complexity   EVALUATION COMPLEXITY: Moderate     PLAN: PT FREQUENCY: 2x/week   PT DURATION: 8 weeks   PLANNED INTERVENTIONS: Therapeutic exercises, Therapeutic activity, Neuromuscular re-education, Balance training, Gait training, Patient/Family education, Joint manipulation, Joint mobilization, Stair training, Vestibular training, Canalith repositioning, Orthotic/Fit training, DME instructions, Electrical stimulation, Cryotherapy, Moist heat, and Manual therapy   PLAN FOR NEXT SESSION: Assess DVA when able. Review HEP on smooth pursuits, slow VOR. Add standing balance to HEP. Work on visual scanning with mobility, patient having increased tendency to veer to R with [place], PT, DPT 10/04/2021, 10:29 AM

## 2021-10-04 NOTE — Patient Instructions (Signed)
Oculomotor: Smooth Pursuits    Holding a target, keep eyes on target and slowly move target side to side with head still. Completed 10 times. Then keep eyes on target and slowly move target up and down with stead still completed 10 times. Perform sitting. Do 2 sessions per day.    Gaze Stabilization: Sitting    Keeping eyes on target on wall 3-4 feet away, tilt head down 15-30 and move head side to side for 30 seconds. Repeat while moving head up and down for 30 seconds. Do 2 sessions per day.

## 2021-10-06 ENCOUNTER — Ambulatory Visit: Payer: Medicare Other

## 2021-10-06 VITALS — BP 116/72 | HR 85

## 2021-10-06 DIAGNOSIS — R42 Dizziness and giddiness: Secondary | ICD-10-CM | POA: Diagnosis not present

## 2021-10-06 DIAGNOSIS — R2681 Unsteadiness on feet: Secondary | ICD-10-CM

## 2021-10-06 DIAGNOSIS — R2689 Other abnormalities of gait and mobility: Secondary | ICD-10-CM

## 2021-10-06 DIAGNOSIS — R293 Abnormal posture: Secondary | ICD-10-CM

## 2021-10-06 DIAGNOSIS — R262 Difficulty in walking, not elsewhere classified: Secondary | ICD-10-CM

## 2021-10-06 NOTE — Therapy (Signed)
OUTPATIENT PHYSICAL THERAPY VESTIBULAR TREATMENT NOTE   Patient Name: Logan Hobbs MRN: [id number] DOB:October 06, 1934, 87 y.o., male Today's Date: 10/06/2021  PCP: Marda Stalker, PA-C REFERRING PROVIDER: Leta Baptist, MD   PT End of Session - 10/06/21 1004     Visit Number 3    Number of Visits 17    Date for PT Re-Evaluation 11/19/21    Authorization Type Medicare A+B/BCBS    PT Start Time 1005    PT Stop Time 1050    PT Time Calculation (min) 45 min    Activity Tolerance Patient tolerated treatment well    Behavior During Therapy [facility] for tasks assessed/performed             Past Medical History:  Diagnosis Date   Abrasion 02/10/2021   Dec 25, 2020 Entered By: Ree Kida Comment: Status post fall   Allergic rhinitis 02/02/2011   Allergic rhinitis due to pollen 02/26/2021   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 Followed by Dr. Farris Has at [facility].   Cataract 09/18/2020   Change in bowel habit 09/02/2021   Chronic idiopathic constipation 02/26/2021   Cognitive disorder 02/10/2021   Colon polyp 02/13/2012   Dec. 16, 2011 and next one due 5 years   Constipation 09/18/2020   Diabetes mellitus with coincident hypertension ([place]) 03/31/2017   Diabetes mellitus with neuropathy ([place]) 02/14/2013   Diabetic peripheral neuropathy associated with type 2 diabetes mellitus ([place]) 02/26/2021   Diarrhea 09/02/2021   Dizziness    Encounter for fitting and adjustment of hearing aid 09/18/2020    Essential hypertension 05/16/2018   Falls    Gait abnormality 03/15/2015   Followed by Dr. Yolonda Kida with [facility]. He does have neuropathy. Dr. Audley Hose note 07/03/14 says etiology of apractic gait unclear. Ongoing neurology follow up.   Gout 02/26/2021   Hardening of the aorta (main artery of the heart) ([place]) 02/26/2021   Hearing loss 05/16/2018   History of fall 02/10/2021   History of malignant neoplasm of prostate 02/26/2021   Hyperlipidemia 05/16/2018   Impacted cerumen, bilateral 09/18/2020   Intraventricular hemorrhage ([place]) 05/06/2021   Lumbar degenerative disc disease 03/15/2015   Mild multilevel degenerative disc changes on MRI of the lumbar spine ordered by Dr. Yolonda Kida, done 06/15/14   Male erectile dysfunction, unspecified 09/13/2016   Malignant neoplasm of prostate ([place]) 10/26/2010   Need for prophylactic vaccination and inoculation against influenza 09/18/2020   Neuropathy 06/20/2014   Obesity 09/18/2020   Onychomycosis 05/16/2018   Osteoarthritis 05/17/2013   Left shoulder   Other abnormalities of gait and mobility 02/10/2021   Pre-operative cardiovascular examination 05/16/2018   Pure hypercholesterolemia 02/26/2021   Rash and other nonspecific skin eruption 09/18/2020   Recurrent falls 02/26/2021   Ringing in ears 09/18/2020   Senile nuclear sclerosis 01/30/2013   Sensorineural hearing loss, bilateral 09/18/2020   Syncope and collapse 12/21/2020   Tinea cruris 05/16/2018   Tremor 02/10/2021   Trochanteric bursitis of right hip 05/28/2019   Type 2 diabetes mellitus without complications ([place]) 51/89/8421  Vertigo 05/16/2018   Vitamin D insufficiency 05/27/2013   Past Surgical History:  Procedure Laterality Date   BLADDER SURGERY     bladder neck contracture repair   CATARACT EXTRACTION Left 2014   PROSTATE SURGERY  2009   TONSILLECTOMY     VASECTOMY  1978   WISDOM TOOTH EXTRACTION     Patient Active Problem List   Diagnosis Date Noted   Dizziness  09/02/2021   Falls 09/02/2021   Change in bowel habit 09/02/2021   Diarrhea 09/02/2021   Intraventricular hemorrhage ([place]) 05/06/2021   Allergic rhinitis due to pollen 02/26/2021   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   Atherosclerosis of both carotid arteries 02/26/2021   Chronic idiopathic constipation 02/26/2021   Diabetic peripheral neuropathy associated with type 2 diabetes mellitus ([place]) 02/26/2021   Gout 02/26/2021   Hardening of the aorta (main artery of the heart) ([place]) 02/26/2021   History of malignant neoplasm of prostate 02/26/2021   Pure hypercholesterolemia 02/26/2021   Recurrent falls 02/26/2021   Abrasion 02/10/2021   Cognitive disorder 02/10/2021   History of fall 02/10/2021   Other abnormalities of gait and mobility 02/10/2021   Tremor 02/10/2021   Syncope and collapse 12/21/2020   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   Cataract 09/18/2020   Constipation 09/18/2020   Encounter for fitting and adjustment of hearing aid 09/18/2020   Impacted cerumen, bilateral 09/18/2020   Need for prophylactic vaccination and inoculation against influenza 09/18/2020   Obesity 09/18/2020   Rash and other nonspecific skin eruption 09/18/2020   Ringing in ears 09/18/2020   Sensorineural hearing loss, bilateral 09/18/2020   Type 2 diabetes mellitus without complications ([place]) 94/17/4081   Trochanteric bursitis of right hip 05/28/2019   Cancer ([place]) 05/16/2018   Hearing loss 05/16/2018   Hyperlipidemia 05/16/2018   Onychomycosis 05/16/2018   Tinea cruris 05/16/2018   Vertigo 05/16/2018   Pre-operative cardiovascular examination 05/16/2018   Essential hypertension 05/16/2018   Diabetes mellitus with coincident hypertension ([place]) 03/31/2017   Blood pressure alteration 12/15/2016   Benign localized prostatic hyperplasia without lower urinary tract symptoms (LUTS) 09/13/2016   Male erectile dysfunction, unspecified 09/13/2016   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/04/2016    Gait abnormality 03/15/2015   Lumbar degenerative disc disease 03/15/2015   Neuropathy 06/20/2014   Vitamin D insufficiency 05/27/2013   Osteoarthritis 05/17/2013   Diabetes mellitus with neuropathy ([place]) 02/14/2013   Senile nuclear sclerosis 01/30/2013   Anxiety 12/13/2012   Colon polyp 02/13/2012   Allergic rhinitis 02/02/2011   Malignant neoplasm of prostate ([place]) 10/26/2010    ONSET DATE: 09/15/2021  REFERRING DIAG: R42 (ICD-10-CM) - Dizziness  THERAPY DIAG:  Dizziness and giddiness  Unsteadiness on feet  Abnormal posture  Difficulty in walking, not elsewhere classified  Other abnormalities of gait and mobility  Rationale for Evaluation and Treatment Rehabilitation  PERTINENT HISTORY: PMH includes multiple falls with + head trauma, hypertension, diabetes, hyperlipidemia, prostate cancer, anxiety. CT (January)  revealed mild layering of hyperdense blood within occipital horns of bilat lateral ventricles, no IPH/SDH/SAH; IVH favored secondary to HTN  PRECAUTIONS: Fall  SUBJECTIVE: Patient reports no new changes. Reports he isnt getting good sleep due to everything going on at home. Reports he is sleepy. No pain.   PAIN:  Are you having pain? No Today's Vitals   10/06/21 1011  BP: 116/72  Pulse: 85   There is no height or weight on file to calculate BMI.  OBJECTIVE:   GAIT: Gait pattern: step through pattern, decreased arm  swing- Right, decreased arm swing- Left, decreased step length- Right, decreased step length- Left, trunk flexed, poor foot clearance- Right, and poor foot clearance- Left Distance walked: throughout therapy gym with activities; plus additional 115'  Assistive device utilized: Walker - 2 wheeled Level of assistance: CGA Comments: PT provided tennis balls for RW for improved propulsion. Patient requiring cues for increased step length bilat, as decreased shortened steps noted. Improvements with cues. Pt still demo intermittent times where he bumps  into items on his R side.   Completed gait with negotiating around obstacles with RW. Completed x 2 laps around 4 cones, PT providing significant cues for step length and technique to improve turn. Patient often returning to shortened shuffled steps with turns or taking very large turn for negotiation. No bumping into obstacles noted with this activity.     VESTIBULAR TREATMENT:  Gaze Adaptation: x1 Viewing Horizontal: Position: Seated, Time: 30 seconds, Reps: 2, and Comment: no dizziness and x1 Viewing Vertical:  Position: Seated, Time: 30 seconds, Reps: 2, and Comment: no dizziness, intermittent cues to maintain eyes focused. More challenge noted with vertical > horizontal.    Standing Balance: Surface: Floor Position: Feet Hip Width Apart Completed with: Eyes Open; Head Turns x 10 Reps and Head Nods x 10 Reps Then completed standing eyes closed, 3 x 30 seconds. CGA intermittent due to increased postural sway. PT educating caregiver on proper supervision and guarding with balance exercises.   Established initial Balance HEP:   Access Code: [email or web address] URL: [email or web address] Date: 10/06/2021 Prepared by: Baldomero Lamy  Exercises - Standing with Head Rotation  - 1 x daily - 5 x weekly - 2 sets - 10 reps - Standing with Head Nod  - 1 x daily - 5 x weekly - 2 sets - 10 reps - Standing Balance with Eyes Closed  - 1 x daily - 5 x weekly - 1 sets - 3 reps - 30 seconds hold    PATIENT EDUCATION: Education details: HEP Additions Person educated: Patient and Caregiver Education method: Explanation Education comprehension: verbalized understanding     GOALS: Goals reviewed with patient? Yes   SHORT TERM GOALS: Target date: 10/22/2021    Pt will be independent with initial HEP for improved balance and gaze stabilization  Baseline: no HEP established Goal status: INITIAL   2.  Berg Balance TBA and LTG to be set as applicable Baseline: TBA; Assessed on 10/04/21 Goal  status: MET   3.  DVA TBA and LTG to be set as applicable Baseline: TBA Goal status: INITIAL   LONG TERM GOALS: Target date: 11/19/2021     Pt will be independent with final HEP for improved balance and gaze stabilization Baseline: to be established Goal status: INITIAL   2.  Pt will improve Berg Balance by 5 points from baseline to demonstrate improved standing balance and reduced fall risk Baseline: TBA; 36/56 Goal status: INITIAL   3.  Pt will report </= 1/5 for all movements on MSQ to indicate improvement in motion sensitivity and improved activity tolerance.  Baseline: 2-3/5 Goal status: INITIAL   4.  LTG to be set for DVA as applicable Baseline: TBA Goal status: INITIAL   5.  Pt will be able to ambulate >/= 500 ft with LRAD and supervision Baseline: CGA with ambulation  Goal status: INITIAL   6.  Pt will improve DPS to >/= 53% Baseline: 46% Goal status: INITIAL   ASSESSMENT:   CLINICAL IMPRESSION: Today's skilled PT session focused on continued  gait training for improved step length. Provided tennis balls for improved propulsion. Then rest of session spent on static standing balance without UE support and added to HEP to further improved standing balance. Patient tolerating well. Intermittent rest breaks required. Will continue per POC.      OBJECTIVE IMPAIRMENTS Abnormal gait, decreased activity tolerance, decreased balance, decreased endurance, decreased knowledge of use of DME, difficulty walking, decreased strength, decreased safety awareness, dizziness, impaired sensation, postural dysfunction, and pain.    ACTIVITY LIMITATIONS lifting, bending, standing, stairs, transfers, and locomotion level   PARTICIPATION LIMITATIONS: cleaning, medication management, and community activity   PERSONAL FACTORS Age, Time since onset of injury/illness/exacerbation, and 3+ comorbidities: multiple falls with + head trauma, hypertension, diabetes, hyperlipidemia, prostate cancer,  anxiety  are also affecting patient's functional outcome.    REHAB POTENTIAL: Good   CLINICAL DECISION MAKING: Evolving/moderate complexity   EVALUATION COMPLEXITY: Moderate     PLAN: PT FREQUENCY: 2x/week   PT DURATION: 8 weeks   PLANNED INTERVENTIONS: Therapeutic exercises, Therapeutic activity, Neuromuscular re-education, Balance training, Gait training, Patient/Family education, Joint manipulation, Joint mobilization, Stair training, Vestibular training, Canalith repositioning, Orthotic/Fit training, DME instructions, Electrical stimulation, Cryotherapy, Moist heat, and Manual therapy   PLAN FOR NEXT SESSION: Assess DVA when able. Continue activiites for smooth pursuits, slow VOR.How was balance additions?. Work on visual scanning with mobility, patient having increased tendency to veer to R with [place], PT, DPT 10/06/2021, 10:59 AM

## 2021-10-08 ENCOUNTER — Encounter (HOSPITAL_COMMUNITY): Payer: Medicare Other

## 2021-10-08 ENCOUNTER — Ambulatory Visit: Payer: Medicare Other

## 2021-10-11 ENCOUNTER — Ambulatory Visit: Payer: Medicare Other

## 2021-10-15 ENCOUNTER — Ambulatory Visit: Payer: Medicare Other

## 2021-10-15 VITALS — BP 102/68 | HR 82

## 2021-10-15 DIAGNOSIS — R2681 Unsteadiness on feet: Secondary | ICD-10-CM

## 2021-10-15 DIAGNOSIS — R293 Abnormal posture: Secondary | ICD-10-CM

## 2021-10-15 DIAGNOSIS — R262 Difficulty in walking, not elsewhere classified: Secondary | ICD-10-CM

## 2021-10-15 DIAGNOSIS — R42 Dizziness and giddiness: Secondary | ICD-10-CM

## 2021-10-15 DIAGNOSIS — R2689 Other abnormalities of gait and mobility: Secondary | ICD-10-CM

## 2021-10-18 ENCOUNTER — Ambulatory Visit: Payer: Medicare Other | Admitting: Physical Therapy

## 2021-10-18 ENCOUNTER — Encounter: Payer: Self-pay | Admitting: Physical Therapy

## 2021-10-18 DIAGNOSIS — R262 Difficulty in walking, not elsewhere classified: Secondary | ICD-10-CM

## 2021-10-18 DIAGNOSIS — R2689 Other abnormalities of gait and mobility: Secondary | ICD-10-CM

## 2021-10-18 DIAGNOSIS — R42 Dizziness and giddiness: Secondary | ICD-10-CM | POA: Diagnosis not present

## 2021-10-18 DIAGNOSIS — R2681 Unsteadiness on feet: Secondary | ICD-10-CM

## 2021-10-20 ENCOUNTER — Ambulatory Visit: Payer: Medicare Other

## 2021-10-20 VITALS — BP 132/78 | HR 77

## 2021-10-20 DIAGNOSIS — R42 Dizziness and giddiness: Secondary | ICD-10-CM

## 2021-10-20 DIAGNOSIS — R2681 Unsteadiness on feet: Secondary | ICD-10-CM

## 2021-10-20 DIAGNOSIS — R293 Abnormal posture: Secondary | ICD-10-CM

## 2021-10-20 DIAGNOSIS — R262 Difficulty in walking, not elsewhere classified: Secondary | ICD-10-CM

## 2021-10-20 DIAGNOSIS — R2689 Other abnormalities of gait and mobility: Secondary | ICD-10-CM

## 2021-10-20 NOTE — Therapy (Signed)
OUTPATIENT PHYSICAL THERAPY VESTIBULAR TREATMENT NOTE   Patient Name: Logan Hobbs MRN: [id number] DOB:[birth year], 87 y.o., male Today's Date: 10/20/2021  PCP: Marda Stalker, PA-C REFERRING PROVIDER: Leta Baptist, MD   PT End of Session - 10/20/21 1012     Visit Number 6    Number of Visits 17    Date for PT Re-Evaluation 11/19/21    Authorization Type Medicare A+B/BCBS    PT Start Time 4142    PT Stop Time 1057    PT Time Calculation (min) 42 min    Activity Tolerance Patient tolerated treatment well    Behavior During Therapy [facility] for tasks assessed/performed              Past Medical History:  Diagnosis Date   Abrasion 02/10/2021   Dec 25, 2020 Entered By: Ree Kida Comment: Status post fall   Allergic rhinitis 02/02/2011   Allergic rhinitis due to pollen 02/26/2021   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 Followed by Dr. Farris Has at [facility].   Cataract 09/18/2020   Change in bowel habit 09/02/2021   Chronic idiopathic constipation 02/26/2021   Cognitive disorder 02/10/2021   Colon polyp 02/13/2012   Dec. 16, 2011 and next one due 5 years   Constipation 09/18/2020   Diabetes mellitus with coincident hypertension ([place]) 03/31/2017   Diabetes mellitus with neuropathy ([place]) 02/14/2013   Diabetic peripheral neuropathy associated with type 2 diabetes mellitus ([place]) 02/26/2021   Diarrhea 09/02/2021   Dizziness    Encounter for fitting and adjustment of hearing aid 09/18/2020    Essential hypertension 05/16/2018   Falls    Gait abnormality 03/15/2015   Followed by Dr. Yolonda Kida with [facility]. He does have neuropathy. Dr. Audley Hose note 07/03/14 says etiology of apractic gait unclear. Ongoing neurology follow up.   Gout 02/26/2021   Hardening of the aorta (main artery of the heart) ([place]) 02/26/2021   Hearing loss 05/16/2018   History of fall 02/10/2021   History of malignant neoplasm of prostate 02/26/2021   Hyperlipidemia 05/16/2018   Impacted cerumen, bilateral 09/18/2020   Intraventricular hemorrhage ([place]) 05/06/2021   Lumbar degenerative disc disease 03/15/2015   Mild multilevel degenerative disc changes on MRI of the lumbar spine ordered by Dr. Yolonda Kida, done 06/15/14   Male erectile dysfunction, unspecified 09/13/2016   Malignant neoplasm of prostate ([place]) 10/26/2010   Need for prophylactic vaccination and inoculation against influenza 09/18/2020   Neuropathy 06/20/2014   Obesity 09/18/2020   Onychomycosis 05/16/2018   Osteoarthritis 05/17/2013   Left shoulder   Other abnormalities of gait and mobility 02/10/2021   Pre-operative cardiovascular examination 05/16/2018   Pure hypercholesterolemia 02/26/2021   Rash and other nonspecific skin eruption 09/18/2020   Recurrent falls 02/26/2021   Ringing in ears 09/18/2020   Senile nuclear sclerosis 01/30/2013   Sensorineural hearing loss, bilateral 09/18/2020   Syncope and collapse 12/21/2020   Tinea cruris 05/16/2018   Tremor 02/10/2021   Trochanteric bursitis of right hip 05/28/2019   Type 2 diabetes mellitus without complications ([place]) 39/53/2023  Vertigo 05/16/2018   Vitamin D insufficiency 05/27/2013   Past Surgical History:  Procedure Laterality Date   BLADDER SURGERY     bladder neck contracture repair   CATARACT EXTRACTION Left 2014   PROSTATE SURGERY  2009   TONSILLECTOMY     VASECTOMY  1978   WISDOM TOOTH EXTRACTION     Patient Active Problem List   Diagnosis Date Noted   Dizziness  09/02/2021   Falls 09/02/2021   Change in bowel habit 09/02/2021   Diarrhea 09/02/2021   Intraventricular hemorrhage ([place]) 05/06/2021   Allergic rhinitis due to pollen 02/26/2021   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   Atherosclerosis of both carotid arteries 02/26/2021   Chronic idiopathic constipation 02/26/2021   Diabetic peripheral neuropathy associated with type 2 diabetes mellitus ([place]) 02/26/2021   Gout 02/26/2021   Hardening of the aorta (main artery of the heart) ([place]) 02/26/2021   History of malignant neoplasm of prostate 02/26/2021   Pure hypercholesterolemia 02/26/2021   Recurrent falls 02/26/2021   Abrasion 02/10/2021   Cognitive disorder 02/10/2021   History of fall 02/10/2021   Other abnormalities of gait and mobility 02/10/2021   Tremor 02/10/2021   Syncope and collapse 12/21/2020   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   Cataract 09/18/2020   Constipation 09/18/2020   Encounter for fitting and adjustment of hearing aid 09/18/2020   Impacted cerumen, bilateral 09/18/2020   Need for prophylactic vaccination and inoculation against influenza 09/18/2020   Obesity 09/18/2020   Rash and other nonspecific skin eruption 09/18/2020   Ringing in ears 09/18/2020   Sensorineural hearing loss, bilateral 09/18/2020   Type 2 diabetes mellitus without complications ([place]) 68/34/1962   Trochanteric bursitis of right hip 05/28/2019   Cancer ([place]) 05/16/2018   Hearing loss 05/16/2018   Hyperlipidemia 05/16/2018   Onychomycosis 05/16/2018   Tinea cruris 05/16/2018   Vertigo 05/16/2018   Pre-operative cardiovascular examination 05/16/2018   Essential hypertension 05/16/2018   Diabetes mellitus with coincident hypertension ([place]) 03/31/2017   Blood pressure alteration 12/15/2016   Benign localized prostatic hyperplasia without lower urinary tract symptoms (LUTS) 09/13/2016   Male erectile dysfunction, unspecified 09/13/2016   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/04/2016    Gait abnormality 03/15/2015   Lumbar degenerative disc disease 03/15/2015   Neuropathy 06/20/2014   Vitamin D insufficiency 05/27/2013   Osteoarthritis 05/17/2013   Diabetes mellitus with neuropathy ([place]) 02/14/2013   Senile nuclear sclerosis 01/30/2013   Anxiety 12/13/2012   Colon polyp 02/13/2012   Allergic rhinitis 02/02/2011   Malignant neoplasm of prostate ([place]) 10/26/2010    ONSET DATE: 09/15/2021  REFERRING DIAG: R42 (ICD-10-CM) - Dizziness  THERAPY DIAG:  Dizziness and giddiness  Unsteadiness on feet  Difficulty in walking, not elsewhere classified  Other abnormalities of gait and mobility  Abnormal posture  Rationale for Evaluation and Treatment Rehabilitation  PERTINENT HISTORY: PMH includes multiple falls with + head trauma, hypertension, diabetes, hyperlipidemia, prostate cancer, anxiety. CT (January)  revealed mild layering of hyperdense blood within occipital horns of bilat lateral ventricles, no IPH/SDH/SAH; IVH favored secondary to HTN  PRECAUTIONS: Fall  SUBJECTIVE: Reports no new changes/complaints. Does have some stiffness/numbness in the R Foot today. No falls. Still not sure if they have rollator.   PAIN:  Are you having pain? No  Today's Vitals   10/20/21 1020  BP: 132/78  Pulse: 77   There is no height or weight on file to calculate BMI.  OBJECTIVE:  THEREX:  Completed activity tolerance/endurance on SciFit on Level 2.0  x 5 minutes, cues for large reciprocal movements. Improvements in stiffness reported.  Completed Forward Steps Up: at stairs with 6" step completed alternating step forward with single UE support x 10 reps bilat, cues for foot clearance as needed.   GAIT: Gait pattern: step through pattern, decreased arm swing- Right, decreased arm swing- Left, decreased step length- Right, decreased step length- Left, trunk flexed, poor foot clearance- Right, and poor foot clearance- Left Distance walked: clinic distance with RW; Completed  ambulation with Rollator x 230' Assistive device utilized: Environmental consultant - 2 wheeled; Walker - 4 wheeled Level of assistance: CGA Comments: Caregiver still unsure if they have a Rollator at this time. Will plan to find out prior to next session. Still trialed ambulation with Rollator with addition of visual scanning, searching for playing cards placed around the therapy gym. Pt demo challenge finding cards place in lower right quadrant. Required mod cues for scanning in this direction. Toward end of ambulation, CGA required and mod cues for keeping AD close and step length. Small shuffled steps noted with fatigue.    VESTIBULAR TREATMENT: Alternating Step Taps: standing at stairs completed alternating toe taps to 1st step x 10 reps bilat with single UE support, then progressed to second step x 10 reps bilat, cues for foot clearance when bringing foot back to floor.   Standing Balance: Surface: Floor - 2  Position: Narrow Base of Support Completed with: Eyes Closed; 3 x 30 seconds, increased posterior sway requiring CGA and intermittent touch to rails at stairs as needed.   Tandem Stance:  Surface: Floor Completed with: Eyes Open;   Time: staggered stance with Eyes Open, 2 x 30 seconds with alternating foot position    PATIENT EDUCATION: Education details: STG Progress; Continue to complete HEP as able Person educated: Patient and Caregiver Education method: Explanation Education comprehension: verbalized understanding     GOALS: Goals reviewed with patient? Yes   SHORT TERM GOALS: Target date: 10/22/2021    Pt will be independent with initial HEP for improved balance and gaze stabilization  Baseline: no HEP established; Reports  independence with HEP not completing consistently however Goal status: PARTIALLY MET   2.  Berg Balance TBA and LTG to be set as applicable Baseline: TBA; Assessed on 10/04/21 Goal status: MET   3.  DVA TBA and LTG to be set as applicable Baseline: TBA;  Deferred due to difficulty relaxing Goal status: Deferred   LONG TERM GOALS: Target date: 11/19/2021     Pt will be independent with final HEP for improved balance and gaze stabilization Baseline: to be established Goal status: INITIAL   2.  Pt will improve Berg Balance by 5 points from baseline to demonstrate improved standing balance and reduced fall risk Baseline: TBA; 36/56 Goal status: INITIAL   3.  Pt will report </= 1/5 for all movements on MSQ to indicate improvement in motion sensitivity and improved activity tolerance.  Baseline: 2-3/5 Goal status: INITIAL   4.  LTG to be set for DVA as applicable Baseline: TBA Goal status: DEFERRED   5.  Pt will be able to ambulate >/= 500 ft with LRAD and supervision Baseline: CGA with ambulation  Goal status: INITIAL   6.  Pt will improve DPS to >/= 53% Baseline: 46% Goal status: INITIAL   ASSESSMENT:   CLINICAL IMPRESSION: Continued gait training with Rollator today during session with addition of visual scanning environment, intermittent challenge noted. Rest of session spent on strengthening and standing balance. Pt tolerating  well. Will continue to progress toward all LTGs.    OBJECTIVE IMPAIRMENTS Abnormal gait, decreased activity tolerance, decreased balance, decreased endurance, decreased knowledge of use of DME, difficulty walking, decreased strength, decreased safety awareness, dizziness, impaired sensation, postural dysfunction, and pain.    ACTIVITY LIMITATIONS lifting, bending, standing, stairs, transfers, and locomotion level   PARTICIPATION LIMITATIONS: cleaning, medication management, and community activity   PERSONAL FACTORS Age, Time since onset of injury/illness/exacerbation, and 3+ comorbidities: multiple falls with + head trauma, hypertension, diabetes, hyperlipidemia, prostate cancer, anxiety  are also affecting patient's functional outcome.    REHAB POTENTIAL: Good   CLINICAL DECISION MAKING:  Evolving/moderate complexity   EVALUATION COMPLEXITY: Moderate     PLAN: PT FREQUENCY: 2x/week   PT DURATION: 8 weeks   PLANNED INTERVENTIONS: Therapeutic exercises, Therapeutic activity, Neuromuscular re-education, Balance training, Gait training, Patient/Family education, Joint manipulation, Joint mobilization, Stair training, Vestibular training, Canalith repositioning, Orthotic/Fit training, DME instructions, Electrical stimulation, Cryotherapy, Moist heat, and Manual therapy   PLAN FOR NEXT SESSION: progress slow VOR in sitting. Continue trialing Rollator (do they actually have one at home?). Work on visual scanning with mobility, patient having increased tendency to veer to R with RW. Standing balance and strengthening. Exercises to promote improved posture.    Jones Bales, PT, DPT 10/20/2021, 10:59 AM

## 2021-10-20 NOTE — Progress Notes (Signed)
Office Note     CC: Incidental right renal artery aneurysm Requesting Provider:  Marda Stalker, PA-C  HPI: Logan Hobbs is a 87 y.o. 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 The aneurysm was discovered incidentally 6 months ago on CT.   On exam today, he was accompanied by his aide and daughter as both he and his wife are wheelchair dependent.   Logan Hobbs was doing well.  He had no complaints.  No dysuria, no hematuria, no renal insufficiency.He denied abdominal pain, flank pain, but has chronic lumbar back pain.  He denied rest pain in his lower extremities and tissue loss.  The pt is  on a statin for cholesterol management.  The pt is not on a daily aspirin.   Other AC:  - The pt is on medication for hypertension.   The pt is  diabetic.  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 Followed by Dr. Farris Has at [facility].   Cataract 09/18/2020   Change in bowel habit 09/02/2021   Chronic idiopathic constipation 02/26/2021   Cognitive disorder 02/10/2021   Colon polyp 02/13/2012   Dec. 16, 2011 and next one due 5 years   Constipation 09/18/2020   Diabetes mellitus with coincident hypertension ([place]) 03/31/2017    Diabetes mellitus with neuropathy ([place]) 02/14/2013   Diabetic peripheral neuropathy associated with type 2 diabetes mellitus ([place]) 02/26/2021   Diarrhea 09/02/2021   Dizziness    Encounter for fitting and adjustment of hearing aid 09/18/2020   Essential hypertension 05/16/2018   Falls    Gait abnormality 03/15/2015   Followed by Dr. Yolonda Kida with [facility]. He does have neuropathy. Dr. Audley Hose note 07/03/14 says etiology of apractic gait unclear. Ongoing neurology follow up.   Gout 02/26/2021   Hardening of the aorta (main artery of the heart) ([place]) 02/26/2021   Hearing loss 05/16/2018   History of fall 02/10/2021   History of malignant neoplasm of prostate 02/26/2021   Hyperlipidemia 05/16/2018   Impacted cerumen, bilateral 09/18/2020   Intraventricular hemorrhage ([place]) 05/06/2021   Lumbar degenerative disc disease 03/15/2015   Mild multilevel degenerative disc changes on MRI of the lumbar spine ordered by Dr. Yolonda Kida, done 06/15/14   Male erectile dysfunction, unspecified 09/13/2016   Malignant neoplasm of prostate ([place]) 10/26/2010   Need for prophylactic vaccination and inoculation against influenza 09/18/2020   Neuropathy 06/20/2014   Obesity 09/18/2020   Onychomycosis 05/16/2018   Osteoarthritis 05/17/2013   Left shoulder   Other abnormalities of gait and mobility 02/10/2021   Pre-operative cardiovascular examination 05/16/2018   Pure hypercholesterolemia 02/26/2021   Rash and other nonspecific skin eruption 09/18/2020   Recurrent falls 02/26/2021   Ringing in ears 09/18/2020   Senile nuclear sclerosis 01/30/2013   Sensorineural hearing loss,  bilateral 09/18/2020   Syncope and collapse 12/21/2020   Tinea cruris 05/16/2018   Tremor 02/10/2021   Trochanteric bursitis of right hip 05/28/2019   Type 2 diabetes mellitus without complications ([place]) 22/05/5425   Vertigo 05/16/2018   Vitamin D insufficiency 05/27/2013    Past Surgical History:  Procedure Laterality Date    BLADDER SURGERY     bladder neck contracture repair   CATARACT EXTRACTION Left 2014   PROSTATE SURGERY  2009   TONSILLECTOMY     VASECTOMY  1978   WISDOM TOOTH EXTRACTION      Social History   Socioeconomic History   Marital status: Married    Spouse name: Not on file   Number of children: 1   Years of education: Not on file   Highest education level: Master's degree (e.g., MA, MS, MEng, MEd, MSW, MBA)  Occupational History    Comment: retired Chiropodist  Tobacco Use   Smoking status: Never   Smokeless tobacco: Never  Substance and Sexual Activity   Alcohol use: Not Currently    Alcohol/week: 7.0 standard drinks of alcohol    Types: 7 Cans of beer per week   Drug use: Never   Sexual activity: Not on file  Other Topics Concern   Not on file  Social History Narrative   06/09/21 lives at [place]    Caffeine    Social Determinants of Health   Financial Resource Strain: Not on file  Food Insecurity: Not on file  Transportation Needs: Not on file  Physical Activity: Not on file  Stress: Not on file  Social Connections: Not on file  Intimate Partner Violence: Not on file   Family History  Problem Relation Age of Onset   Heart disease Father    Hypertension Father    Heart disease Brother    Cancer Brother    Diabetes Neg Hx     Current Outpatient Medications  Medication Sig Dispense Refill   acetaminophen (TYLENOL) 500 MG tablet Take 1,000 mg by mouth in the morning and at bedtime.     Cholecalciferol (D3-1000 PO) Take 2,000 mg by mouth every evening.     cycloSPORINE (RESTASIS) 0.05 % ophthalmic emulsion Place 1 drop into both eyes 2 (two) times daily.     escitalopram (LEXAPRO) 5 MG tablet Take 5 mg by mouth every morning.     losartan (COZAAR) 50 MG tablet Take 50 mg by mouth daily.     ondansetron (ZOFRAN-ODT) 8 MG disintegrating tablet Take 1 tablet (8 mg total) by mouth every 8 (eight) hours as needed for nausea or vomiting. 20 tablet 0    Polyethylene Glycol 3350 (MIRALAX PO) Take 1 Dose by mouth 4 (four) times a week.     rosuvastatin (CRESTOR) 10 MG tablet Take 10 mg by mouth every morning.     sitaGLIPtin-metformin (JANUMET) 50-1000 MG tablet Take 1 tablet by mouth 2 (two) times daily.     vitamin B-12 (CYANOCOBALAMIN) 1000 MCG tablet Take 1,000 mcg by mouth daily.     No current facility-administered medications for this visit.    Allergies  Allergen Reactions   Latex Shortness Of Breath    Skin itching and breathing problems Other reaction(s): rash   Ciprofloxacin Other (See Comments)    constipation   Seasonal Ic [Cholestatin] Other (See Comments)    Unknown reaction   Solifenacin Succinate Other (See Comments)    constipation   Tamsulosin Hcl     Weakness, vertigo, and nausea  REVIEW OF SYSTEMS:   '[X]'$  denotes positive finding, '[ ]'$  denotes negative finding Cardiac  Comments:  Chest pain or chest pressure:    Shortness of breath upon exertion:    Short of breath when lying flat:    Irregular heart rhythm:        Vascular    Pain in calf, thigh, or hip brought on by ambulation:    Pain in feet at night that wakes you up from your sleep:     Blood clot in your veins:    Leg swelling:         Pulmonary    Oxygen at home:    Productive cough:     Wheezing:         Neurologic    Sudden weakness in arms or legs:     Sudden numbness in arms or legs:     Sudden onset of difficulty speaking or slurred speech:    Temporary loss of vision in one eye:     Problems with dizziness:         Gastrointestinal    Blood in stool:     Vomited blood:         Genitourinary    Burning when urinating:     Blood in urine:        Psychiatric    Major depression:         Hematologic    Bleeding problems:    Problems with blood clotting too easily:        Skin    Rashes or ulcers:        Constitutional    Fever or chills:      PHYSICAL EXAMINATION:  There were no vitals filed for this  visit.  General:  WDWN in NAD; vital signs documented above Gait: Not observed HENT: WNL, normocephalic Pulmonary: normal non-labored breathing , without Rales, rhonchi,  wheezing Cardiac: regular HR,  Abdomen: soft, NT, no masses Skin: without rashes Vascular Exam/Pulses:  Right Left  Radial 2+ (normal) 2+ (normal)  Ulnar 2+ (normal) 2+ (normal)  Femoral    Popliteal    DP 2+ (normal) 2+ (normal)  PT 2+ (normal) 2+ (normal)   Extremities: without ischemic changes, without Gangrene , without cellulitis; without open wounds;  Musculoskeletal: no muscle wasting or atrophy  Neurologic: A&O X 3;  No focal weakness or paresthesias are detected Psychiatric:  The pt has Normal affect.   Non-Invasive Vascular Imaging:   01/17/21    10/22/21 Right: Normal size right kidney. No evidence of right renal artery         stenosis.            Renal artery aneurym measures 1.61 x 1.49 cms  *See table(s) above for measurements and observations.    ASSESSMENT/PLAN: ZEPLIN ALESHIRE is a 87 y.o. male presenting in follow-up with known right renal artery aneurysm.  Flex ultrasonography of the aneurysm was reviewed demonstrating stable size of 1.6 cm.    Had a long conversation with Logan Hobbs regarding the above.  Society for Vascular Surgery guidelines recommend treatment of renal artery aneurysms greater than 3 cm.  With his current age, comorbidities, and location of the aneurysm, he would not be an open candidate.  We discussed that the size greater than 3 cm would likely result in embolization of the aneurysm, which could affect his renal function.  Being that there has been no size change of last 6 months, we will continue to observe.  I will see him in 1 year for repeat imaging.    He was asked to call my office should any questions or concerns arise.  He was asked to call the office should he have any hematuria dysuria, and asked to seek immediate medical attention should he have severe left flank  pain.   Broadus John, MD [facility] [phone]

## 2021-10-22 ENCOUNTER — Encounter: Payer: Self-pay | Admitting: Vascular Surgery

## 2021-10-22 ENCOUNTER — Ambulatory Visit (INDEPENDENT_AMBULATORY_CARE_PROVIDER_SITE_OTHER): Payer: Medicare Other | Admitting: Vascular Surgery

## 2021-10-22 ENCOUNTER — Ambulatory Visit (HOSPITAL_COMMUNITY)
Admission: RE | Admit: 2021-10-22 | Discharge: 2021-10-22 | Disposition: A | Discharge status: Home / Self Care | Payer: Medicare Other | Source: Ambulatory Visit | Attending: Vascular Surgery | Admitting: Vascular Surgery

## 2021-10-22 VITALS — BP 154/89 | HR 60 | Temp 97.8°F | Resp 16 | Ht 70.5 in | Wt 195.0 lb

## 2021-10-22 DIAGNOSIS — I722 Aneurysm of renal artery: Secondary | ICD-10-CM | POA: Diagnosis present

## 2021-10-25 ENCOUNTER — Encounter: Payer: Self-pay | Admitting: Physical Therapy

## 2021-10-25 ENCOUNTER — Ambulatory Visit: Payer: Medicare Other | Attending: Otolaryngology | Admitting: Physical Therapy

## 2021-10-25 VITALS — BP 120/78

## 2021-10-25 DIAGNOSIS — R293 Abnormal posture: Secondary | ICD-10-CM | POA: Diagnosis present

## 2021-10-25 DIAGNOSIS — R2681 Unsteadiness on feet: Secondary | ICD-10-CM | POA: Insufficient documentation

## 2021-10-25 DIAGNOSIS — R2689 Other abnormalities of gait and mobility: Secondary | ICD-10-CM | POA: Insufficient documentation

## 2021-10-25 DIAGNOSIS — R42 Dizziness and giddiness: Secondary | ICD-10-CM | POA: Insufficient documentation

## 2021-10-25 DIAGNOSIS — R262 Difficulty in walking, not elsewhere classified: Secondary | ICD-10-CM | POA: Diagnosis present

## 2021-10-25 NOTE — Therapy (Signed)
OUTPATIENT PHYSICAL THERAPY VESTIBULAR TREATMENT NOTE   Patient Name: Logan Hobbs MRN: [id number] DOB:June 10, 1934, 87 y.o., male Today's Date: 10/25/2021  PCP: Marda Stalker, PA-C REFERRING PROVIDER: Leta Baptist, MD   PT End of Session - 10/25/21 0933     Visit Number 7    Number of Visits 17    Date for PT Re-Evaluation 11/19/21    Authorization Type Medicare A+B/BCBS    PT Start Time 0929    PT Stop Time 9233    PT Time Calculation (min) 43 min    Activity Tolerance Patient tolerated treatment well    Behavior During Therapy [facility] for tasks assessed/performed              Past Medical History:  Diagnosis Date   Abrasion 02/10/2021   Dec 25, 2020 Entered By: Ree Kida Comment: Status post fall   Allergic rhinitis 02/02/2011   Allergic rhinitis due to pollen 02/26/2021   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 Followed by Dr. Farris Has at [facility].   Cataract 09/18/2020   Change in bowel habit 09/02/2021   Chronic idiopathic constipation 02/26/2021   Cognitive disorder 02/10/2021   Colon polyp 02/13/2012   Dec. 16, 2011 and next one due 5 years   Constipation 09/18/2020   Diabetes mellitus with coincident hypertension ([place]) 03/31/2017   Diabetes mellitus with neuropathy ([place]) 02/14/2013   Diabetic peripheral neuropathy associated with type 2 diabetes mellitus ([place]) 02/26/2021   Diarrhea 09/02/2021   Dizziness    Encounter for fitting and adjustment of hearing aid 09/18/2020    Essential hypertension 05/16/2018   Falls    Gait abnormality 03/15/2015   Followed by Dr. Yolonda Kida with [facility]. He does have neuropathy. Dr. Audley Hose note 07/03/14 says etiology of apractic gait unclear. Ongoing neurology follow up.   Gout 02/26/2021   Hardening of the aorta (main artery of the heart) ([place]) 02/26/2021   Hearing loss 05/16/2018   History of fall 02/10/2021   History of malignant neoplasm of prostate 02/26/2021   Hyperlipidemia 05/16/2018   Impacted cerumen, bilateral 09/18/2020   Intraventricular hemorrhage ([place]) 05/06/2021   Lumbar degenerative disc disease 03/15/2015   Mild multilevel degenerative disc changes on MRI of the lumbar spine ordered by Dr. Yolonda Kida, done 06/15/14   Male erectile dysfunction, unspecified 09/13/2016   Malignant neoplasm of prostate ([place]) 10/26/2010   Need for prophylactic vaccination and inoculation against influenza 09/18/2020   Neuropathy 06/20/2014   Obesity 09/18/2020   Onychomycosis 05/16/2018   Osteoarthritis 05/17/2013   Left shoulder   Other abnormalities of gait and mobility 02/10/2021   Pre-operative cardiovascular examination 05/16/2018   Pure hypercholesterolemia 02/26/2021   Rash and other nonspecific skin eruption 09/18/2020   Recurrent falls 02/26/2021   Ringing in ears 09/18/2020   Senile nuclear sclerosis 01/30/2013   Sensorineural hearing loss, bilateral 09/18/2020   Syncope and collapse 12/21/2020   Tinea cruris 05/16/2018   Tremor 02/10/2021   Trochanteric bursitis of right hip 05/28/2019   Type 2 diabetes mellitus without complications ([place]) 00/76/2263  Vertigo 05/16/2018   Vitamin D insufficiency 05/27/2013   Past Surgical History:  Procedure Laterality Date   BLADDER SURGERY     bladder neck contracture repair   CATARACT EXTRACTION Left 2014   PROSTATE SURGERY  2009   TONSILLECTOMY     VASECTOMY  1978   WISDOM TOOTH EXTRACTION     Patient Active Problem List   Diagnosis Date Noted   Dizziness  09/02/2021   Falls 09/02/2021   Change in bowel habit 09/02/2021   Diarrhea 09/02/2021   Intraventricular hemorrhage ([place]) 05/06/2021   Allergic rhinitis due to pollen 02/26/2021   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   Atherosclerosis of both carotid arteries 02/26/2021   Chronic idiopathic constipation 02/26/2021   Diabetic peripheral neuropathy associated with type 2 diabetes mellitus ([place]) 02/26/2021   Gout 02/26/2021   Hardening of the aorta (main artery of the heart) ([place]) 02/26/2021   History of malignant neoplasm of prostate 02/26/2021   Pure hypercholesterolemia 02/26/2021   Recurrent falls 02/26/2021   Abrasion 02/10/2021   Cognitive disorder 02/10/2021   History of fall 02/10/2021   Other abnormalities of gait and mobility 02/10/2021   Tremor 02/10/2021   Syncope and collapse 12/21/2020   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   Cataract 09/18/2020   Constipation 09/18/2020   Encounter for fitting and adjustment of hearing aid 09/18/2020   Impacted cerumen, bilateral 09/18/2020   Need for prophylactic vaccination and inoculation against influenza 09/18/2020   Obesity 09/18/2020   Rash and other nonspecific skin eruption 09/18/2020   Ringing in ears 09/18/2020   Sensorineural hearing loss, bilateral 09/18/2020   Type 2 diabetes mellitus without complications ([place]) 60/73/7106   Trochanteric bursitis of right hip 05/28/2019   Cancer ([place]) 05/16/2018   Hearing loss 05/16/2018   Hyperlipidemia 05/16/2018   Onychomycosis 05/16/2018   Tinea cruris 05/16/2018   Vertigo 05/16/2018   Pre-operative cardiovascular examination 05/16/2018   Essential hypertension 05/16/2018   Diabetes mellitus with coincident hypertension ([place]) 03/31/2017   Blood pressure alteration 12/15/2016   Benign localized prostatic hyperplasia without lower urinary tract symptoms (LUTS) 09/13/2016   Male erectile dysfunction, unspecified 09/13/2016   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/04/2016    Gait abnormality 03/15/2015   Lumbar degenerative disc disease 03/15/2015   Neuropathy 06/20/2014   Vitamin D insufficiency 05/27/2013   Osteoarthritis 05/17/2013   Diabetes mellitus with neuropathy ([place]) 02/14/2013   Senile nuclear sclerosis 01/30/2013   Anxiety 12/13/2012   Colon polyp 02/13/2012   Allergic rhinitis 02/02/2011   Malignant neoplasm of prostate ([place]) 10/26/2010    ONSET DATE: 09/15/2021  REFERRING DIAG: R42 (ICD-10-CM) - Dizziness  THERAPY DIAG:  Unsteadiness on feet  Dizziness and giddiness  Difficulty in walking, not elsewhere classified  Rationale for Evaluation and Treatment Rehabilitation  PERTINENT HISTORY: PMH includes multiple falls with + head trauma, hypertension, diabetes, hyperlipidemia, prostate cancer, anxiety. CT (January)  revealed mild layering of hyperdense blood within occipital horns of bilat lateral ventricles, no IPH/SDH/SAH; IVH favored secondary to HTN  PRECAUTIONS: Fall  SUBJECTIVE: Not as dizzy recently, nothing major. No falls. Feeling a little sluggish today.   PAIN:  Are you having pain? No  Today's Vitals   10/25/21 0936  BP: 120/78    There is no height or weight on file to calculate BMI.  OBJECTIVE:  THEREX:  Completed activity tolerance/endurance and strengthening on SciFit on Level 2.0 x 7 minutes, cues for large reciprocal movements. Pt reporting feeling good to move like this. Discussed use of  NuStep in the gym at where he lives for aerobic activity/strengthening starting at 8-10 minutes.   With BUE support: x10 reps heel raises, x10 reps toe raises with cues for 3 second hold, then performed an additional x10 reps of each with lessening UE support, pt with decr ROM when performing.     GAIT: Gait pattern: step through pattern, decreased arm swing- Right, decreased arm swing- Left, decreased step length- Right, decreased step length- Left, trunk flexed, poor foot clearance- Right, and poor foot clearance-  Left Distance walked: clinic distance with RW;  Assistive device utilized: Environmental consultant - 2 wheeled; Level of assistance: SBA/CGA Comments: With RW, cued for posture, stride length and staying closer to RW in between activities.    NMR: In // bars: Side stepping down and back x3 reps without UE support, cued for posture, incr step length and foot clearance esp when going to R. Forward marching with BUE support down and back x3 reps, cues for 3-5 second hold for SLS and incr hip flexion ROM On air ex with wide BOS: reaching outside BOS in multi-directions to tap cone (held by PT tech) x15 reps each side, min guard at times for balance, pt with tendency to almost lose balance posteriorly, but was able to use ankle strategy the majority of the time to maintain balance.   Standing Balance: Surface: Floor  Position: Narrow Base of Support Completed with: Eyes Closed; 2 x 20 seconds, 2 x 30 seconds, cues for posture first before closing eyes, able to improve with incr reps, intermittent taps to bars to balance (more so when leaning to R), and pt tendency to lose balance posteriorly at times, needing min guard for balance.   PATIENT EDUCATION: Education details: Using the NuStep where he lives for strength/endurance/ROM as pt reports feeling good after using one of these machines.  Person educated: Patient and Caregiver Education method: Explanation Education comprehension: verbalized understanding     GOALS: Goals reviewed with patient? Yes   SHORT TERM GOALS: Target date: 10/22/2021    Pt will be independent with initial HEP for improved balance and gaze stabilization  Baseline: no HEP established; Reports  independence with HEP not completing consistently however Goal status: PARTIALLY MET   2.  Berg Balance TBA and LTG to be set as applicable Baseline: TBA; Assessed on 10/04/21 Goal status: MET   3.  DVA TBA and LTG to be set as applicable Baseline: TBA; Deferred due to difficulty  relaxing Goal status: Deferred   LONG TERM GOALS: Target date: 11/19/2021     Pt will be independent with final HEP for improved balance and gaze stabilization Baseline: to be established Goal status: INITIAL   2.  Pt will improve Berg Balance by 5 points from baseline to demonstrate improved standing balance and reduced fall risk Baseline: TBA; 36/56 Goal status: INITIAL   3.  Pt will report </= 1/5 for all movements on MSQ to indicate improvement in motion sensitivity and improved activity tolerance.  Baseline: 2-3/5 Goal status: INITIAL   4.  LTG to be set for DVA as applicable Baseline: TBA Goal status: DEFERRED   5.  Pt will be able to ambulate >/= 500 ft with LRAD and supervision Baseline: CGA with ambulation  Goal status: INITIAL   6.  Pt will improve DPS to >/= 53% Baseline: 46% Goal status: INITIAL   ASSESSMENT:   CLINICAL IMPRESSION: Today's skilled session focused on BLE strengthening and balance working on decr UE support. Pt enjoys the  Scifit for strength,ROM, and endurance and discussed use of the NuStep where he lives. With eyes closed, pt with tendency to either lose his balance to the R or posteriorly on his heels, needing bars at times for balance or min guard from therapist. Will continue to progress towards LTGs.    OBJECTIVE IMPAIRMENTS Abnormal gait, decreased activity tolerance, decreased balance, decreased endurance, decreased knowledge of use of DME, difficulty walking, decreased strength, decreased safety awareness, dizziness, impaired sensation, postural dysfunction, and pain.    ACTIVITY LIMITATIONS lifting, bending, standing, stairs, transfers, and locomotion level   PARTICIPATION LIMITATIONS: cleaning, medication management, and community activity   PERSONAL FACTORS Age, Time since onset of injury/illness/exacerbation, and 3+ comorbidities: multiple falls with + head trauma, hypertension, diabetes, hyperlipidemia, prostate cancer, anxiety  are also  affecting patient's functional outcome.    REHAB POTENTIAL: Good   CLINICAL DECISION MAKING: Evolving/moderate complexity   EVALUATION COMPLEXITY: Moderate     PLAN: PT FREQUENCY: 2x/week   PT DURATION: 8 weeks   PLANNED INTERVENTIONS: Therapeutic exercises, Therapeutic activity, Neuromuscular re-education, Balance training, Gait training, Patient/Family education, Joint manipulation, Joint mobilization, Stair training, Vestibular training, Canalith repositioning, Orthotic/Fit training, DME instructions, Electrical stimulation, Cryotherapy, Moist heat, and Manual therapy   PLAN FOR NEXT SESSION: progress slow VOR in sitting. Continue trialing Rollator (do they actually have one at home?). Work on visual scanning with mobility, patient having increased tendency to veer to R with RW. Standing balance and strengthening. Exercises to promote improved posture.    Arliss Journey, PT, DPT 10/25/2021, 10:18 AM

## 2021-10-27 ENCOUNTER — Ambulatory Visit: Payer: Medicare Other

## 2021-10-27 DIAGNOSIS — R2689 Other abnormalities of gait and mobility: Secondary | ICD-10-CM

## 2021-10-27 DIAGNOSIS — R2681 Unsteadiness on feet: Secondary | ICD-10-CM | POA: Diagnosis not present

## 2021-10-27 DIAGNOSIS — R42 Dizziness and giddiness: Secondary | ICD-10-CM

## 2021-10-27 DIAGNOSIS — R262 Difficulty in walking, not elsewhere classified: Secondary | ICD-10-CM

## 2021-10-27 DIAGNOSIS — R293 Abnormal posture: Secondary | ICD-10-CM

## 2021-10-27 NOTE — Therapy (Signed)
OUTPATIENT PHYSICAL THERAPY VESTIBULAR TREATMENT NOTE   Patient Name: ASMAR BROZEK MRN: [id number] DOB:04/22/1935, 87 y.o., male Today's Date: 10/27/2021  PCP: Marda Stalker, PA-C REFERRING PROVIDER: Leta Baptist, MD   PT End of Session - 10/27/21 1319     Visit Number 8    Number of Visits 17    Date for PT Re-Evaluation 11/19/21    Authorization Type Medicare A+B/BCBS    PT Start Time 1316    PT Stop Time 2263    PT Time Calculation (min) 43 min    Activity Tolerance Patient tolerated treatment well    Behavior During Therapy [facility] for tasks assessed/performed              Past Medical History:  Diagnosis Date   Abrasion 02/10/2021   Dec 25, 2020 Entered By: Ree Kida Comment: Status post fall   Allergic rhinitis 02/02/2011   Allergic rhinitis due to pollen 02/26/2021   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 Followed by Dr. Farris Has at [facility].   Cataract 09/18/2020   Change in bowel habit 09/02/2021   Chronic idiopathic constipation 02/26/2021   Cognitive disorder 02/10/2021   Colon polyp 02/13/2012   Dec. 16, 2011 and next one due 5 years   Constipation 09/18/2020   Diabetes mellitus with coincident hypertension ([place]) 03/31/2017   Diabetes mellitus with neuropathy ([place]) 02/14/2013   Diabetic peripheral neuropathy associated with type 2 diabetes mellitus ([place]) 02/26/2021   Diarrhea 09/02/2021   Dizziness    Encounter for fitting and adjustment of hearing aid 09/18/2020    Essential hypertension 05/16/2018   Falls    Gait abnormality 03/15/2015   Followed by Dr. Yolonda Kida with [facility]. He does have neuropathy. Dr. Audley Hose note 07/03/14 says etiology of apractic gait unclear. Ongoing neurology follow up.   Gout 02/26/2021   Hardening of the aorta (main artery of the heart) ([place]) 02/26/2021   Hearing loss 05/16/2018   History of fall 02/10/2021   History of malignant neoplasm of prostate 02/26/2021   Hyperlipidemia 05/16/2018   Impacted cerumen, bilateral 09/18/2020   Intraventricular hemorrhage ([place]) 05/06/2021   Lumbar degenerative disc disease 03/15/2015   Mild multilevel degenerative disc changes on MRI of the lumbar spine ordered by Dr. Yolonda Kida, done 06/15/14   Male erectile dysfunction, unspecified 09/13/2016   Malignant neoplasm of prostate ([place]) 10/26/2010   Need for prophylactic vaccination and inoculation against influenza 09/18/2020   Neuropathy 06/20/2014   Obesity 09/18/2020   Onychomycosis 05/16/2018   Osteoarthritis 05/17/2013   Left shoulder   Other abnormalities of gait and mobility 02/10/2021   Pre-operative cardiovascular examination 05/16/2018   Pure hypercholesterolemia 02/26/2021   Rash and other nonspecific skin eruption 09/18/2020   Recurrent falls 02/26/2021   Ringing in ears 09/18/2020   Senile nuclear sclerosis 01/30/2013   Sensorineural hearing loss, bilateral 09/18/2020   Syncope and collapse 12/21/2020   Tinea cruris 05/16/2018   Tremor 02/10/2021   Trochanteric bursitis of right hip 05/28/2019   Type 2 diabetes mellitus without complications ([place]) 33/54/5625  Vertigo 05/16/2018   Vitamin D insufficiency 05/27/2013   Past Surgical History:  Procedure Laterality Date   BLADDER SURGERY     bladder neck contracture repair   CATARACT EXTRACTION Left 2014   PROSTATE SURGERY  2009   TONSILLECTOMY     VASECTOMY  1978   WISDOM TOOTH EXTRACTION     Patient Active Problem List   Diagnosis Date Noted   Dizziness  09/02/2021   Falls 09/02/2021   Change in bowel habit 09/02/2021   Diarrhea 09/02/2021   Intraventricular hemorrhage ([place]) 05/06/2021   Allergic rhinitis due to pollen 02/26/2021   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   Atherosclerosis of both carotid arteries 02/26/2021   Chronic idiopathic constipation 02/26/2021   Diabetic peripheral neuropathy associated with type 2 diabetes mellitus ([place]) 02/26/2021   Gout 02/26/2021   Hardening of the aorta (main artery of the heart) ([place]) 02/26/2021   History of malignant neoplasm of prostate 02/26/2021   Pure hypercholesterolemia 02/26/2021   Recurrent falls 02/26/2021   Abrasion 02/10/2021   Cognitive disorder 02/10/2021   History of fall 02/10/2021   Other abnormalities of gait and mobility 02/10/2021   Tremor 02/10/2021   Syncope and collapse 12/21/2020   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   Cataract 09/18/2020   Constipation 09/18/2020   Encounter for fitting and adjustment of hearing aid 09/18/2020   Impacted cerumen, bilateral 09/18/2020   Need for prophylactic vaccination and inoculation against influenza 09/18/2020   Obesity 09/18/2020   Rash and other nonspecific skin eruption 09/18/2020   Ringing in ears 09/18/2020   Sensorineural hearing loss, bilateral 09/18/2020   Type 2 diabetes mellitus without complications ([place]) 22/48/2500   Trochanteric bursitis of right hip 05/28/2019   Cancer ([place]) 05/16/2018   Hearing loss 05/16/2018   Hyperlipidemia 05/16/2018   Onychomycosis 05/16/2018   Tinea cruris 05/16/2018   Vertigo 05/16/2018   Pre-operative cardiovascular examination 05/16/2018   Essential hypertension 05/16/2018   Diabetes mellitus with coincident hypertension ([place]) 03/31/2017   Blood pressure alteration 12/15/2016   Benign localized prostatic hyperplasia without lower urinary tract symptoms (LUTS) 09/13/2016   Male erectile dysfunction, unspecified 09/13/2016   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/04/2016    Gait abnormality 03/15/2015   Lumbar degenerative disc disease 03/15/2015   Neuropathy 06/20/2014   Vitamin D insufficiency 05/27/2013   Osteoarthritis 05/17/2013   Diabetes mellitus with neuropathy ([place]) 02/14/2013   Senile nuclear sclerosis 01/30/2013   Anxiety 12/13/2012   Colon polyp 02/13/2012   Allergic rhinitis 02/02/2011   Malignant neoplasm of prostate ([place]) 10/26/2010    ONSET DATE: 09/15/2021  REFERRING DIAG: R42 (ICD-10-CM) - Dizziness  THERAPY DIAG:  Unsteadiness on feet  Dizziness and giddiness  Difficulty in walking, not elsewhere classified  Other abnormalities of gait and mobility  Abnormal posture  Rationale for Evaluation and Treatment Rehabilitation  PERTINENT HISTORY: PMH includes multiple falls with + head trauma, hypertension, diabetes, hyperlipidemia, prostate cancer, anxiety. CT (January)  revealed mild layering of hyperdense blood within occipital horns of bilat lateral ventricles, no IPH/SDH/SAH; IVH favored secondary to HTN  PRECAUTIONS: Fall  SUBJECTIVE: Patient reports that he has been having some fatigue after he hits.  Blood sugar has been staying pretty good in the morning. No falls.    PAIN:  Are you having pain? No  There were no vitals filed for this visit.   There is no height or weight on file to calculate BMI.  OBJECTIVE:  THEREX:  Completed activity tolerance/endurance and strengthening on SciFit on  Level 2.0 x 8 minutes, cues for large reciprocal movements and maintain steps >/= 80. Has been unable to trial NuStep in the gym at where he lives for aerobic activity/strengthening, continued education on trialing.   GAIT: Gait pattern: step through pattern, decreased arm swing- Right, decreased arm swing- Left, decreased step length- Right, decreased step length- Left, trunk flexed, poor foot clearance- Right, and poor foot clearance- Left Distance walked: clinic distance with RW; Assistive device utilized: Environmental consultant - 2  wheeled; Level of assistance: SBA/CGA Comments: With RW, cued for posture, stride length and staying closer to RW in between activities. PT provided new tennis balls for RW for improved propulsion.   NMR:  In // bars: Standing on Firm Surface completed bean bag toss x 10 tosses with cues for upright posture. Supervision. Progressed to standing on airex, and completed additional x 10 tosses with reaching in various directions/across midline. CGA.  Completed alternating toe taps to cones working on reducing BUE support to single UE support 2 x 10 reps bilat.  Intermittent rest breaks required due to fatigue, as well as cues for upright tall posture.   Standing Balance: Surface: Airex  Position: Feet Hip Width Apart Completed with: Eyes Open; completed static stance with bil stance and eyes open 2 x 30 seconds working on improved upright posture. Then added in horizontal/vertical head turns x 10 reps each direction. Increased sway posterior noted with vertical. CGA and Min A at one instance.   PATIENT EDUCATION: Education details: Plan to trial NuStep  Person educated: Patient and Caregiver Education method: Explanation Education comprehension: verbalized understanding     GOALS: Goals reviewed with patient? Yes   SHORT TERM GOALS: Target date: 10/22/2021    Pt will be independent with initial HEP for improved balance and gaze stabilization  Baseline: no HEP established; Reports  independence with HEP not completing consistently however Goal status: PARTIALLY MET   2.  Berg Balance TBA and LTG to be set as applicable Baseline: TBA; Assessed on 10/04/21 Goal status: MET   3.  DVA TBA and LTG to be set as applicable Baseline: TBA; Deferred due to difficulty relaxing Goal status: Deferred   LONG TERM GOALS: Target date: 11/19/2021     Pt will be independent with final HEP for improved balance and gaze stabilization Baseline: to be established Goal status: INITIAL   2.  Pt will  improve Berg Balance by 5 points from baseline to demonstrate improved standing balance and reduced fall risk Baseline: TBA; 36/56 Goal status: INITIAL   3.  Pt will report </= 1/5 for all movements on MSQ to indicate improvement in motion sensitivity and improved activity tolerance.  Baseline: 2-3/5 Goal status: INITIAL   4.  LTG to be set for DVA as applicable Baseline: TBA Goal status: DEFERRED   5.  Pt will be able to ambulate >/= 500 ft with LRAD and supervision Baseline: CGA with ambulation  Goal status: INITIAL   6.  Pt will improve DPS to >/= 53% Baseline: 46% Goal status: INITIAL   ASSESSMENT:   CLINICAL IMPRESSION: Today's skilled session focused on continued SciFit for endurance/strengthening, able to tolerate increased time today. Continued rest of session focused on standing balance and working to promote weight shift/posture with standing activities. Will continue per POC.     OBJECTIVE IMPAIRMENTS Abnormal gait, decreased activity tolerance, decreased balance, decreased endurance, decreased knowledge of use of DME, difficulty walking, decreased strength, decreased safety awareness, dizziness, impaired sensation, postural dysfunction, and pain.  ACTIVITY LIMITATIONS lifting, bending, standing, stairs, transfers, and locomotion level   PARTICIPATION LIMITATIONS: cleaning, medication management, and community activity   PERSONAL FACTORS Age, Time since onset of injury/illness/exacerbation, and 3+ comorbidities: multiple falls with + head trauma, hypertension, diabetes, hyperlipidemia, prostate cancer, anxiety  are also affecting patient's functional outcome.    REHAB POTENTIAL: Good   CLINICAL DECISION MAKING: Evolving/moderate complexity   EVALUATION COMPLEXITY: Moderate     PLAN: PT FREQUENCY: 2x/week   PT DURATION: 8 weeks   PLANNED INTERVENTIONS: Therapeutic exercises, Therapeutic activity, Neuromuscular re-education, Balance training, Gait training,  Patient/Family education, Joint manipulation, Joint mobilization, Stair training, Vestibular training, Canalith repositioning, Orthotic/Fit training, DME instructions, Electrical stimulation, Cryotherapy, Moist heat, and Manual therapy   PLAN FOR NEXT SESSION: progress slow VOR in sitting. Continue trialing Rollator (do they actually have one at home?). Work on visual scanning with mobility, patient having increased tendency to veer to R with RW. Standing balance and strengthening. Exercises to promote improved posture.    Jones Bales, PT, DPT 10/27/2021, 2:00 PM

## 2021-11-01 ENCOUNTER — Ambulatory Visit: Payer: Medicare Other | Admitting: Physical Therapy

## 2021-11-01 DIAGNOSIS — R2681 Unsteadiness on feet: Secondary | ICD-10-CM

## 2021-11-01 DIAGNOSIS — R293 Abnormal posture: Secondary | ICD-10-CM

## 2021-11-01 DIAGNOSIS — R2689 Other abnormalities of gait and mobility: Secondary | ICD-10-CM

## 2021-11-01 NOTE — Therapy (Signed)
OUTPATIENT PHYSICAL THERAPY VESTIBULAR TREATMENT NOTE   Patient Name: Logan Hobbs MRN: [id number] DOB:Nov 09, 1934, 87 y.o., male Today's Date: 11/01/2021  PCP: Marda Stalker, PA-C REFERRING PROVIDER: Leta Baptist, MD   PT End of Session - 11/01/21 1006     Visit Number 9    Number of Visits 17    Date for PT Re-Evaluation 11/19/21    Authorization Type Medicare A+B/BCBS    PT Start Time 1010    PT Stop Time 1100    PT Time Calculation (min) 50 min    Equipment Utilized During Treatment Gait belt    Activity Tolerance Patient tolerated treatment well    Behavior During Therapy [facility] for tasks assessed/performed              Past Medical History:  Diagnosis Date   Abrasion 02/10/2021   Dec 25, 2020 Entered By: Ree Kida Comment: Status post fall   Allergic rhinitis 02/02/2011   Allergic rhinitis due to pollen 02/26/2021   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 Followed by Dr. Farris Has at [facility].   Cataract 09/18/2020   Change in bowel habit 09/02/2021   Chronic idiopathic constipation 02/26/2021   Cognitive disorder 02/10/2021   Colon polyp 02/13/2012   Dec. 16, 2011 and next one due 5 years   Constipation 09/18/2020   Diabetes mellitus with coincident hypertension ([place]) 03/31/2017   Diabetes mellitus with neuropathy ([place]) 02/14/2013   Diabetic peripheral neuropathy associated with type 2 diabetes mellitus ([place]) 02/26/2021   Diarrhea 09/02/2021   Dizziness    Encounter for  fitting and adjustment of hearing aid 09/18/2020   Essential hypertension 05/16/2018   Falls    Gait abnormality 03/15/2015   Followed [facility] Dr[facility] Yolonda Kida with [facility]. He does have neuropathy. Dr. Audley Hose note 07/03/14 says etiology of apractic gait unclear. Ongoing neurology follow up.   Gout 02/26/2021   Hardening of the aorta (main artery of the heart) ([place]) 02/26/2021   Hearing loss 05/16/2018   History of fall 02/10/2021   History of malignant neoplasm of prostate 02/26/2021   Hyperlipidemia 05/16/2018   Impacted cerumen, bilateral 09/18/2020   Intraventricular hemorrhage ([place]) 05/06/2021   Lumbar degenerative disc disease 03/15/2015   Mild multilevel degenerative disc changes on MRI of the lumbar spine ordered by Dr. Yolonda Kida, done 06/15/14   Male erectile dysfunction, unspecified 09/13/2016   Malignant neoplasm of prostate ([place]) 10/26/2010   Need for prophylactic vaccination and inoculation against influenza 09/18/2020   Neuropathy 06/20/2014   Obesity 09/18/2020   Onychomycosis 05/16/2018   Osteoarthritis 05/17/2013   Left shoulder   Other abnormalities of gait and mobility 02/10/2021   Pre-operative cardiovascular examination 05/16/2018   Pure hypercholesterolemia 02/26/2021   Rash and other nonspecific skin eruption 09/18/2020   Recurrent falls 02/26/2021   Ringing in ears 09/18/2020   Senile nuclear sclerosis 01/30/2013   Sensorineural hearing loss, bilateral 09/18/2020   Syncope and collapse 12/21/2020   Tinea cruris 05/16/2018   Tremor 02/10/2021   Trochanteric bursitis of right hip 05/28/2019  Type 2 diabetes mellitus without complications ([place]) 72/90/2111   Vertigo 05/16/2018   Vitamin D insufficiency 05/27/2013   Past Surgical History:  Procedure Laterality Date   BLADDER SURGERY     bladder neck contracture repair   CATARACT EXTRACTION Left 2014   PROSTATE SURGERY  2009   TONSILLECTOMY     VASECTOMY  1978   WISDOM TOOTH EXTRACTION     Patient Active  Problem List   Diagnosis Date Noted   Dizziness 09/02/2021   Falls 09/02/2021   Change in bowel habit 09/02/2021   Diarrhea 09/02/2021   Intraventricular hemorrhage ([place]) 05/06/2021   Allergic rhinitis due to pollen 02/26/2021   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   Atherosclerosis of both carotid arteries 02/26/2021   Chronic idiopathic constipation 02/26/2021   Diabetic peripheral neuropathy associated with type 2 diabetes mellitus ([place]) 02/26/2021   Gout 02/26/2021   Hardening of the aorta (main artery of the heart) ([place]) 02/26/2021   History of malignant neoplasm of prostate 02/26/2021   Pure hypercholesterolemia 02/26/2021   Recurrent falls 02/26/2021   Abrasion 02/10/2021   Cognitive disorder 02/10/2021   History of fall 02/10/2021   Other abnormalities of gait and mobility 02/10/2021   Tremor 02/10/2021   Syncope and collapse 12/21/2020   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   Cataract 09/18/2020   Constipation 09/18/2020   Encounter for fitting and adjustment of hearing aid 09/18/2020   Impacted cerumen, bilateral 09/18/2020   Need for prophylactic vaccination and inoculation against influenza 09/18/2020   Obesity 09/18/2020   Rash and other nonspecific skin eruption 09/18/2020   Ringing in ears 09/18/2020   Sensorineural hearing loss, bilateral 09/18/2020   Type 2 diabetes mellitus without complications ([place]) 55/20/8022   Trochanteric bursitis of right hip 05/28/2019   Cancer ([place]) 05/16/2018   Hearing loss 05/16/2018   Hyperlipidemia 05/16/2018   Onychomycosis 05/16/2018   Tinea cruris 05/16/2018   Vertigo 05/16/2018   Pre-operative cardiovascular examination 05/16/2018   Essential hypertension 05/16/2018   Diabetes mellitus with coincident hypertension ([place]) 03/31/2017   Blood pressure alteration 12/15/2016   Benign localized prostatic hyperplasia without lower urinary tract symptoms (LUTS) 09/13/2016   Male erectile dysfunction, unspecified  09/13/2016   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/04/2016   Gait abnormality 03/15/2015   Lumbar degenerative disc disease 03/15/2015   Neuropathy 06/20/2014   Vitamin D insufficiency 05/27/2013   Osteoarthritis 05/17/2013   Diabetes mellitus with neuropathy ([place]) 02/14/2013   Senile nuclear sclerosis 01/30/2013   Anxiety 12/13/2012   Colon polyp 02/13/2012   Allergic rhinitis 02/02/2011   Malignant neoplasm of prostate ([place]) 10/26/2010    ONSET DATE: 09/15/2021  REFERRING DIAG: R42 (ICD-10-CM) - Dizziness  THERAPY DIAG:  Other abnormalities of gait and mobility  Unsteadiness on feet  Abnormal posture  Rationale for Evaluation and Treatment Rehabilitation  PERTINENT HISTORY: PMH includes multiple falls with + head trauma, hypertension, diabetes, hyperlipidemia, prostate cancer, anxiety. CT (January)  revealed mild layering of hyperdense blood within occipital horns of bilat lateral ventricles, no IPH/SDH/SAH; IVH favored secondary to HTN  PRECAUTIONS: Fall  SUBJECTIVE: Patient reports he has been doing about the same as he was last week - no changes; states he feels the fatigue is a little bit better; says he has a constant feeling of overall "fogginess" in his head but no true spinning vertigo  PAIN:  Are you having pain? No Used BioFreeze on his knees this morning which helped  There were no vitals filed for this visit.  There is no height or weight on file to calculate BMI.  OBJECTIVE:  THEREX:  Completed activity tolerance/endurance and strengthening on SciFit on Level 2.0 x 9 minutes with bil. UE's & LE's: states he has done NuStep in the gym where he lives - for 8"-10"   Sit to stand from high/low mat table - 3 reps with cues for correct hand placement with rollator in front of pt.   Postural stretches - standing at wall and then at corner - "Y" stretch and then "W" stretch  - 1 rep each - 15 sec hold  ; added these to HEP  GAIT: Gait pattern: step through pattern,  decreased arm swing- Right, decreased arm swing- Left, decreased step length- Right, decreased step length- Left, trunk flexed, poor foot clearance- Right, and poor foot clearance- Left Distance walked: 350' inside gym (3 laps);  approx. 200' outside on pavement with rollator with CGA Assistive device utilized: Rollator  Level of assistance: SBA/CGA; min assist x 1 to control rollator on decline outside on sidewalk  Comments: cues for posture, stride length and staying closer to RW in between activities. Cues for increased step length; cues for correct hand placement with use of rollator   Curb negotiation; outside in parking lot with rollator - min assist for management of rollator and cues for correct sequence with stepping off/on curb - pt needed min assist with stepping up onto curb due to c/o weakness in legs   NMR:  In // bars: Standing on Firm Surface - lifting/holding ball (purple medium sized ball) ovehead x 5 reps to facilitate upright posture       Pt amb. Forward 10' x 1 holding ball overhead to facilitate upright posture with CGA:  amb. Backwards 10' x 1 inside // bars holding ball overhead to improve posture and balance - mirror used for visual feedback  Standing Balance: Pt performed standing balance exercises with UE support on bars - forward, back and side kicks 5 reps each leg alternating Marching in place 5 reps each leg with bil. UE support  PATIENT EDUCATION: Education details: [employer] Carmie Kanner Person educated: Patient and Caregiver Education method: Explanation; demonstration;  handout  Education comprehension: verbalized understanding; demonstrated understanding     GOALS: Goals reviewed with patient? Yes   SHORT TERM GOALS: Target date: 10/22/2021    Pt will be independent with initial HEP for improved balance and gaze stabilization  Baseline: no HEP established; Reports  independence with HEP not completing consistently however Goal status: PARTIALLY MET    2.  Berg Balance TBA and LTG to be set as applicable Baseline: TBA; Assessed on 10/04/21 Goal status: MET   3.  DVA TBA and LTG to be set as applicable Baseline: TBA; Deferred due to difficulty relaxing Goal status: Deferred   LONG TERM GOALS: Target date: 11/19/2021     Pt will be independent with final HEP for improved balance and gaze stabilization Baseline: to be established Goal status: INITIAL   2.  Pt will improve Berg Balance by 5 points from baseline to demonstrate improved standing balance and reduced fall risk Baseline: TBA; 36/56 Goal status: INITIAL   3.  Pt will report </= 1/5 for all movements on MSQ to indicate improvement in motion sensitivity and improved activity tolerance.  Baseline: 2-3/5 Goal status: INITIAL   4.  LTG to be set for DVA as applicable Baseline: TBA Goal status: DEFERRED   5.  Pt will be able to ambulate >/= 500 ft with  LRAD and supervision Baseline: CGA with ambulation  Goal status: INITIAL   6.  Pt will improve DPS to >/= 53% Baseline: 46% Goal status: INITIAL   ASSESSMENT:   CLINICAL IMPRESSION: PT session focused on gait training with use of rollator both inside and outside on paved surface with frequent cues needed for step length  and body positioning in relation to rollator.  Pt also needed cues for correct hand placement with sit to/from stand transfers with use of RW.  Pt reported some low back pain after performing standing balance exercises and walking without UE support on // bars while holding ball overhead to facilitate upright posture. Seated rest break needed due to fatigue and low back pain.  Pt also reported increased dizziness at end of session compared to that at start of session (possibly due to increased activity/fatigue).  Continue per POC.     OBJECTIVE IMPAIRMENTS Abnormal gait, decreased activity tolerance, decreased balance, decreased endurance, decreased knowledge of use of DME, difficulty walking, decreased  strength, decreased safety awareness, dizziness, impaired sensation, postural dysfunction, and pain.    ACTIVITY LIMITATIONS lifting, bending, standing, stairs, transfers, and locomotion level   PARTICIPATION LIMITATIONS: cleaning, medication management, and community activity   PERSONAL FACTORS Age, Time since onset of injury/illness/exacerbation, and 3+ comorbidities: multiple falls with + head trauma, hypertension, diabetes, hyperlipidemia, prostate cancer, anxiety  are also affecting patient's functional outcome.    REHAB POTENTIAL: Good   CLINICAL DECISION MAKING: Evolving/moderate complexity   EVALUATION COMPLEXITY: Moderate     PLAN: PT FREQUENCY: 2x/week   PT DURATION: 8 weeks   PLANNED INTERVENTIONS: Therapeutic exercises, Therapeutic activity, Neuromuscular re-education, Balance training, Gait training, Patient/Family education, Joint manipulation, Joint mobilization, Stair training, Vestibular training, Canalith repositioning, Orthotic/Fit training, DME instructions, Electrical stimulation, Cryotherapy, Moist heat, and Manual therapy   PLAN FOR NEXT SESSION: 10th visit progress note due next session- progress slow VOR in sitting. Continue trialing Rollator (do they actually have one at home?). Work on visual scanning with mobility, patient having increased tendency to veer to R with RW. Standing balance and strengthening. Exercises to promote improved posture.    Alda Lea, PT 11/01/2021, 7:06 PM

## 2021-11-05 ENCOUNTER — Encounter: Payer: Medicare Other | Admitting: Physical Therapy

## 2021-11-08 ENCOUNTER — Ambulatory Visit: Payer: Medicare Other

## 2021-11-08 VITALS — BP 125/71 | HR 76

## 2021-11-08 DIAGNOSIS — R2681 Unsteadiness on feet: Secondary | ICD-10-CM | POA: Diagnosis not present

## 2021-11-08 DIAGNOSIS — R2689 Other abnormalities of gait and mobility: Secondary | ICD-10-CM

## 2021-11-08 DIAGNOSIS — R262 Difficulty in walking, not elsewhere classified: Secondary | ICD-10-CM

## 2021-11-08 DIAGNOSIS — R293 Abnormal posture: Secondary | ICD-10-CM

## 2021-11-08 DIAGNOSIS — R42 Dizziness and giddiness: Secondary | ICD-10-CM

## 2021-11-08 NOTE — Therapy (Signed)
OUTPATIENT PHYSICAL THERAPY VESTIBULAR TREATMENT NOTE/PROGRESS NOTE   Patient Name: Logan Hobbs MRN: [id number] DOB:Nov 19, 1934, 87 y.o., male Today's Date: 11/08/2021  PCP: Marda Stalker, PA-C REFERRING PROVIDER: Leta Baptist, MD Physical Therapy Progress Note   Dates of Reporting Period: 09/22/21 - 11/08/21  See Note below for Objective Data and Assessment of Progress/Goals.  Thank you for the referral of this patient. Guillermina [place], PT, DPT   PT End of Session - 11/08/21 1019     Visit Number 10    Number of Visits 17    Date for PT Re-Evaluation 11/19/21    Authorization Type Medicare A+B/BCBS    PT Start Time 1016    PT Stop Time 1059    PT Time Calculation (min) 43 min    Equipment Utilized During Treatment Gait belt    Activity Tolerance Patient tolerated treatment well    Behavior During Therapy 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 Followed by Dr. Farris Has at [facility].   Cataract 09/18/2020   Change in bowel habit 09/02/2021   Chronic idiopathic constipation 02/26/2021   Cognitive disorder 02/10/2021   Colon polyp 02/13/2012   Dec. 16, 2011 and next one due 5 years   Constipation 09/18/2020   Diabetes mellitus with  coincident hypertension ([place]) 03/31/2017   Diabetes mellitus with neuropathy ([place]) 02/14/2013   Diabetic peripheral neuropathy associated with type 2 diabetes mellitus ([place]) 02/26/2021   Diarrhea 09/02/2021   Dizziness    Encounter for fitting and adjustment of hearing aid 09/18/2020   Essential hypertension 05/16/2018   Falls    Gait abnormality 03/15/2015   Followed by Dr. Yolonda Kida with [facility]. He does have neuropathy. Dr. Audley Hose note 07/03/14 says etiology of apractic gait unclear. Ongoing neurology follow up.   Gout 02/26/2021   Hardening of the aorta (main artery of the heart) ([place]) 02/26/2021   Hearing loss 05/16/2018   History of fall 02/10/2021   History of malignant neoplasm of prostate 02/26/2021   Hyperlipidemia 05/16/2018   Impacted cerumen, bilateral 09/18/2020   Intraventricular hemorrhage ([place]) 05/06/2021   Lumbar degenerative disc disease 03/15/2015   Mild multilevel degenerative disc changes on MRI of the lumbar spine ordered by Dr. Yolonda Kida, done 06/15/14   Male erectile dysfunction, unspecified 09/13/2016   Malignant neoplasm of prostate ([place]) 10/26/2010   Need for prophylactic vaccination and inoculation against influenza 09/18/2020   Neuropathy 06/20/2014   Obesity 09/18/2020   Onychomycosis 05/16/2018   Osteoarthritis 05/17/2013   Left shoulder   Other abnormalities of gait and mobility 02/10/2021   Pre-operative cardiovascular examination 05/16/2018   Pure hypercholesterolemia 02/26/2021   Rash and other nonspecific skin eruption 09/18/2020   Recurrent falls 02/26/2021   Ringing in ears  09/18/2020   Senile nuclear sclerosis 01/30/2013   Sensorineural hearing loss, bilateral 09/18/2020   Syncope and collapse 12/21/2020   Tinea cruris 05/16/2018   Tremor 02/10/2021   Trochanteric bursitis of right hip 05/28/2019   Type 2 diabetes mellitus without complications ([place]) 19/50/9326   Vertigo 05/16/2018   Vitamin D insufficiency 05/27/2013   Past Surgical  History:  Procedure Laterality Date   BLADDER SURGERY     bladder neck contracture repair   CATARACT EXTRACTION Left 2014   PROSTATE SURGERY  2009   TONSILLECTOMY     VASECTOMY  1978   WISDOM TOOTH EXTRACTION     Patient Active Problem List   Diagnosis Date Noted   Dizziness 09/02/2021   Falls 09/02/2021   Change in bowel habit 09/02/2021   Diarrhea 09/02/2021   Intraventricular hemorrhage ([place]) 05/06/2021   Allergic rhinitis due to pollen 02/26/2021   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   Atherosclerosis of both carotid arteries 02/26/2021   Chronic idiopathic constipation 02/26/2021   Diabetic peripheral neuropathy associated with type 2 diabetes mellitus ([place]) 02/26/2021   Gout 02/26/2021   Hardening of the aorta (main artery of the heart) ([place]) 02/26/2021   History of malignant neoplasm of prostate 02/26/2021   Pure hypercholesterolemia 02/26/2021   Recurrent falls 02/26/2021   Abrasion 02/10/2021   Cognitive disorder 02/10/2021   History of fall 02/10/2021   Other abnormalities of gait and mobility 02/10/2021   Tremor 02/10/2021   Syncope and collapse 12/21/2020   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   Cataract 09/18/2020   Constipation 09/18/2020   Encounter for fitting and adjustment of hearing aid 09/18/2020   Impacted cerumen, bilateral 09/18/2020   Need for prophylactic vaccination and inoculation against influenza 09/18/2020   Obesity 09/18/2020   Rash and other nonspecific skin eruption 09/18/2020   Ringing in ears 09/18/2020   Sensorineural hearing loss, bilateral 09/18/2020   Type 2 diabetes mellitus without complications ([place]) 71/24/5809   Trochanteric bursitis of right hip 05/28/2019   Cancer ([place]) 05/16/2018   Hearing loss 05/16/2018   Hyperlipidemia 05/16/2018   Onychomycosis 05/16/2018   Tinea cruris 05/16/2018   Vertigo 05/16/2018   Pre-operative cardiovascular examination 05/16/2018   Essential hypertension 05/16/2018   Diabetes  mellitus with coincident hypertension ([place]) 03/31/2017   Blood pressure alteration 12/15/2016   Benign localized prostatic hyperplasia without lower urinary tract symptoms (LUTS) 09/13/2016   Male erectile dysfunction, unspecified 09/13/2016   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/04/2016   Gait abnormality 03/15/2015   Lumbar degenerative disc disease 03/15/2015   Neuropathy 06/20/2014   Vitamin D insufficiency 05/27/2013   Osteoarthritis 05/17/2013   Diabetes mellitus with neuropathy ([place]) 02/14/2013   Senile nuclear sclerosis 01/30/2013   Anxiety 12/13/2012   Colon polyp 02/13/2012   Allergic rhinitis 02/02/2011   Malignant neoplasm of prostate ([place]) 10/26/2010    ONSET DATE: 09/15/2021  REFERRING DIAG: R42 (ICD-10-CM) - Dizziness  THERAPY DIAG:  Other abnormalities of gait and mobility  Unsteadiness on feet  Abnormal posture  Dizziness and giddiness  Difficulty in walking, not elsewhere classified  Rationale for Evaluation and Treatment Rehabilitation  PERTINENT HISTORY: PMH includes multiple falls with + head trauma, hypertension, diabetes, hyperlipidemia, prostate cancer, anxiety. CT (January)  revealed mild layering of hyperdense blood within occipital horns of bilat lateral ventricles, no IPH/SDH/SAH; IVH favored secondary to HTN  PRECAUTIONS: Fall  SUBJECTIVE: Patient reports no new changes/complaints. Feeling like the legs are still feeling weak when he is up and moving, feel like  the knees want to stay bent. No other new changes/complaints. No falls.  Did have a little numbness intermittent in the legs over the weekend.   PAIN:  Are you having pain? No  Today's Vitals   11/08/21 1023  BP: 125/71  Pulse: 76   There is no height or weight on file to calculate BMI.  OBJECTIVE:   THEREX:  Completed activity tolerance/endurance, strengthening, and large reciprocal movements on NuStep on Level 4.0 x 6 minutes with bil. UE's & LE's.   GAIT: Gait pattern: step through  pattern, decreased arm swing- Right, decreased arm swing- Left, decreased step length- Right, decreased step length- Left, trunk flexed, poor foot clearance- Right, and poor foot clearance- Left Distance walked: clinic distance Assistive device utilized: RW Level of assistance: SBA/CGA Comments: cues for posture, stride length and staying closer to RW in between activities. Continue to demo shuffled steps with increased ambulation distance with patient attributing to fatigue.   NMR: Standing at [place], completed lateral weight shift with reaching to target on bilat directions, completed x 10 reps bilaterally, progressing distance between targets to further challenge weight shift. CGA. Cues for upright posture as with patient fatigue began to demo increased forward lean.  Stepping Strategy: At countertop with black balance beam completed lateral stepping strategy over/back beam x 10 reps with RLE, and then x 10 reps with LLE. CGA intermittent. Then completed forward/posterior stepping strategy x 10 reps alternating LE. More challenge with A/P > Lateral. CGA required and cues for step length and posture with completion.   Completed activity seated working on improved forward lean and then pushing up into upright posture x 10 reps. Cues for intensity of pushing into standing and and improved forward lean working on improved upright posturing with seated and standing activities. Carried this over into sit to stands with working on improved forward lean pushing into standing and then upright tall posture upon standing. Completed x 10 reps.   PATIENT EDUCATION: Education details: Continue HEP Person educated: Patient and Armed forces training and education officer method: Explanation; demonstration;  handout  Education comprehension: verbalized understanding; demonstrated understanding     GOALS: Goals reviewed with patient? Yes   SHORT TERM GOALS: Target date: 10/22/2021    Pt will be independent with initial HEP for  improved balance and gaze stabilization  Baseline: no HEP established; Reports  independence with HEP not completing consistently however Goal status: PARTIALLY MET   2.  Berg Balance TBA and LTG to be set as applicable Baseline: TBA; Assessed on 10/04/21 Goal status: MET   3.  DVA TBA and LTG to be set as applicable Baseline: TBA; Deferred due to difficulty relaxing Goal status: Deferred   LONG TERM GOALS: Target date: 11/19/2021     Pt will be independent with final HEP for improved balance and gaze stabilization Baseline: to be established Goal status: INITIAL   2.  Pt will improve Berg Balance by 5 points from baseline to demonstrate improved standing balance and reduced fall risk Baseline: TBA; 36/56 Goal status: INITIAL   3.  Pt will report </= 1/5 for all movements on MSQ to indicate improvement in motion sensitivity and improved activity tolerance.  Baseline: 2-3/5 Goal status: INITIAL   4.  LTG to be set for DVA as applicable Baseline: TBA Goal status: DEFERRED   5.  Pt will be able to ambulate >/= 500 ft with LRAD and supervision Baseline: CGA with ambulation  Goal status: INITIAL   6.  Pt will improve DPS to >/=  53% Baseline: 46% Goal status: INITIAL   ASSESSMENT:   CLINICAL IMPRESSION: Today's skilled PT session focused on continued seated/standing balance and activities to promote improved stepping strategy bilaterally and upright posture. Patient tolerating activities well, but continue to demo forward lean and small shortened step lengths with increased fatigue. Will continue per POC.   OBJECTIVE IMPAIRMENTS Abnormal gait, decreased activity tolerance, decreased balance, decreased endurance, decreased knowledge of use of DME, difficulty walking, decreased strength, decreased safety awareness, dizziness, impaired sensation, postural dysfunction, and pain.    ACTIVITY LIMITATIONS lifting, bending, standing, stairs, transfers, and locomotion level    PARTICIPATION LIMITATIONS: cleaning, medication management, and community activity   PERSONAL FACTORS Age, Time since onset of injury/illness/exacerbation, and 3+ comorbidities: multiple falls with + head trauma, hypertension, diabetes, hyperlipidemia, prostate cancer, anxiety  are also affecting patient's functional outcome.    REHAB POTENTIAL: Good   CLINICAL DECISION MAKING: Evolving/moderate complexity   EVALUATION COMPLEXITY: Moderate     PLAN: PT FREQUENCY: 2x/week   PT DURATION: 8 weeks   PLANNED INTERVENTIONS: Therapeutic exercises, Therapeutic activity, Neuromuscular re-education, Balance training, Gait training, Patient/Family education, Joint manipulation, Joint mobilization, Stair training, Vestibular training, Canalith repositioning, Orthotic/Fit training, DME instructions, Electrical stimulation, Cryotherapy, Moist heat, and Manual therapy   PLAN FOR NEXT SESSION:  Continue activities to promote step length, upright posture, improved stepping strategy. Work on visual scanning with mobility, patient having increased tendency to veer to R with RW. Standing balance and strengthening. Exercises to promote improved posture.    Kirtland Bouchard, PT, DPT 11/08/2021, 11:11 AM

## 2021-11-10 ENCOUNTER — Ambulatory Visit: Payer: Medicare Other

## 2021-11-10 VITALS — BP 103/65 | HR 75

## 2021-11-10 DIAGNOSIS — R42 Dizziness and giddiness: Secondary | ICD-10-CM

## 2021-11-10 DIAGNOSIS — R293 Abnormal posture: Secondary | ICD-10-CM

## 2021-11-10 DIAGNOSIS — R2681 Unsteadiness on feet: Secondary | ICD-10-CM

## 2021-11-10 DIAGNOSIS — R262 Difficulty in walking, not elsewhere classified: Secondary | ICD-10-CM

## 2021-11-10 DIAGNOSIS — R2689 Other abnormalities of gait and mobility: Secondary | ICD-10-CM

## 2021-11-10 NOTE — Therapy (Signed)
OUTPATIENT PHYSICAL THERAPY VESTIBULAR TREATMENT NOTE   Patient Name: Logan Hobbs MRN: [id number] DOB:06/09/1934, 87 y.o., male Today's Date: 11/10/2021  PCP: Marda Stalker, PA-C REFERRING PROVIDER: Leta Baptist, MD   PT End of Session - 11/10/21 1315     Visit Number 11    Number of Visits 17    Date for PT Re-Evaluation 11/19/21    Authorization Type Medicare A+B/BCBS    PT Start Time 1315    PT Stop Time 1400    PT Time Calculation (min) 45 min    Equipment Utilized During Treatment Gait belt    Activity Tolerance Patient tolerated treatment well    Behavior During Therapy [facility] for tasks assessed/performed             Past Medical History:  Diagnosis Date   Abrasion 02/10/2021   Dec 25, 2020 Entered By: Ree Kida Comment: Status post fall   Allergic rhinitis 02/02/2011   Allergic rhinitis due to pollen 02/26/2021   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 Followed by Dr. Farris Has at [facility].   Cataract 09/18/2020   Change in bowel habit 09/02/2021   Chronic idiopathic constipation 02/26/2021   Cognitive disorder 02/10/2021   Colon polyp 02/13/2012   Dec. 16, 2011 and next one due 5 years   Constipation 09/18/2020   Diabetes mellitus with coincident hypertension ([place]) 03/31/2017   Diabetes mellitus with neuropathy ([place]) 02/14/2013   Diabetic peripheral neuropathy associated with type 2 diabetes mellitus ([place]) 02/26/2021   Diarrhea 09/02/2021   Dizziness    Encounter for  fitting and adjustment of hearing aid 09/18/2020   Essential hypertension 05/16/2018   Falls    Gait abnormality 03/15/2015   Followed by Dr. Yolonda Kida with [facility]. He does have neuropathy. Dr. Audley Hose note 07/03/14 says etiology of apractic gait unclear. Ongoing neurology follow up.   Gout 02/26/2021   Hardening of the aorta (main artery of the heart) ([place]) 02/26/2021   Hearing loss 05/16/2018   History of fall 02/10/2021   History of malignant neoplasm of prostate 02/26/2021   Hyperlipidemia 05/16/2018   Impacted cerumen, bilateral 09/18/2020   Intraventricular hemorrhage ([place]) 05/06/2021   Lumbar degenerative disc disease 03/15/2015   Mild multilevel degenerative disc changes on MRI of the lumbar spine ordered by Dr. Yolonda Kida, done 06/15/14   Male erectile dysfunction, unspecified 09/13/2016   Malignant neoplasm of prostate ([place]) 10/26/2010   Need for prophylactic vaccination and inoculation against influenza 09/18/2020   Neuropathy 06/20/2014   Obesity 09/18/2020   Onychomycosis 05/16/2018   Osteoarthritis 05/17/2013   Left shoulder   Other abnormalities of gait and mobility 02/10/2021   Pre-operative cardiovascular examination 05/16/2018   Pure hypercholesterolemia 02/26/2021   Rash and other nonspecific skin eruption 09/18/2020   Recurrent falls 02/26/2021   Ringing in ears 09/18/2020   Senile nuclear sclerosis 01/30/2013   Sensorineural hearing loss, bilateral 09/18/2020   Syncope and collapse 12/21/2020   Tinea cruris 05/16/2018   Tremor 02/10/2021   Trochanteric bursitis of right hip 05/28/2019   Type  2 diabetes mellitus without complications ([place]) 28/00/3491   Vertigo 05/16/2018   Vitamin D insufficiency 05/27/2013   Past Surgical History:  Procedure Laterality Date   BLADDER SURGERY     bladder neck contracture repair   CATARACT EXTRACTION Left 2014   PROSTATE SURGERY  2009   TONSILLECTOMY     VASECTOMY  1978   WISDOM TOOTH EXTRACTION     Patient Active  Problem List   Diagnosis Date Noted   Dizziness 09/02/2021   Falls 09/02/2021   Change in bowel habit 09/02/2021   Diarrhea 09/02/2021   Intraventricular hemorrhage ([place]) 05/06/2021   Allergic rhinitis due to pollen 02/26/2021   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   Atherosclerosis of both carotid arteries 02/26/2021   Chronic idiopathic constipation 02/26/2021   Diabetic peripheral neuropathy associated with type 2 diabetes mellitus ([place]) 02/26/2021   Gout 02/26/2021   Hardening of the aorta (main artery of the heart) ([place]) 02/26/2021   History of malignant neoplasm of prostate 02/26/2021   Pure hypercholesterolemia 02/26/2021   Recurrent falls 02/26/2021   Abrasion 02/10/2021   Cognitive disorder 02/10/2021   History of fall 02/10/2021   Other abnormalities of gait and mobility 02/10/2021   Tremor 02/10/2021   Syncope and collapse 12/21/2020   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   Cataract 09/18/2020   Constipation 09/18/2020   Encounter for fitting and adjustment of hearing aid 09/18/2020   Impacted cerumen, bilateral 09/18/2020   Need for prophylactic vaccination and inoculation against influenza 09/18/2020   Obesity 09/18/2020   Rash and other nonspecific skin eruption 09/18/2020   Ringing in ears 09/18/2020   Sensorineural hearing loss, bilateral 09/18/2020   Type 2 diabetes mellitus without complications ([place]) 79/15/0569   Trochanteric bursitis of right hip 05/28/2019   Cancer ([place]) 05/16/2018   Hearing loss 05/16/2018   Hyperlipidemia 05/16/2018   Onychomycosis 05/16/2018   Tinea cruris 05/16/2018   Vertigo 05/16/2018   Pre-operative cardiovascular examination 05/16/2018   Essential hypertension 05/16/2018   Diabetes mellitus with coincident hypertension ([place]) 03/31/2017   Blood pressure alteration 12/15/2016   Benign localized prostatic hyperplasia without lower urinary tract symptoms (LUTS) 09/13/2016   Male erectile dysfunction, unspecified  09/13/2016   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/04/2016   Gait abnormality 03/15/2015   Lumbar degenerative disc disease 03/15/2015   Neuropathy 06/20/2014   Vitamin D insufficiency 05/27/2013   Osteoarthritis 05/17/2013   Diabetes mellitus with neuropathy ([place]) 02/14/2013   Senile nuclear sclerosis 01/30/2013   Anxiety 12/13/2012   Colon polyp 02/13/2012   Allergic rhinitis 02/02/2011   Malignant neoplasm of prostate ([place]) 10/26/2010    ONSET DATE: 09/15/2021  REFERRING DIAG: R42 (ICD-10-CM) - Dizziness  THERAPY DIAG:  Other abnormalities of gait and mobility  Unsteadiness on feet  Abnormal posture  Dizziness and giddiness  Difficulty in walking, not elsewhere classified  Rationale for Evaluation and Treatment Rehabilitation  PERTINENT HISTORY: PMH includes multiple falls with + head trauma, hypertension, diabetes, hyperlipidemia, prostate cancer, anxiety. CT (January)  revealed mild layering of hyperdense blood within occipital horns of bilat lateral ventricles, no IPH/SDH/SAH; IVH favored secondary to HTN  PRECAUTIONS: Fall  SUBJECTIVE: Patient reports that he felt a little weak today. Feels weak after he eats lunch. No new changes. Reports has been trying to stand tall. Patient walking in with a very shuffled step.  PAIN:  Are you having pain? No  Today's Vitals   11/10/21 1322  BP: 103/65  Pulse: 75   There is no height or  weight on file to calculate BMI.  OBJECTIVE:  THEREX:  Completed activity tolerance/endurance, strengthening, and large reciprocal movements on NuStep on Level 4.5 x 7 minutes with bil. UE's & LE's. Pt tolerating increased time today. BP after completion:   GAIT: Gait pattern: step through pattern, decreased arm swing- Right, decreased arm swing- Left, decreased step length- Right, decreased step length- Left, trunk flexed, poor foot clearance- Right, and poor foot clearance- Left Distance walked: clinic distance Assistive device utilized:  RW Level of assistance: SBA/CGA Comments: cues for posture, stride length and staying closer to RW throughout gait with activities. More shuffled steps noted today.    NMR: Stepping Strategy: in // with black balance beam completed forward/posterior stepping strategy x 10 reps alternating on BLE. CGA required and cues for step length and posture with completion. Use of mirror for visual feedback to promote improved upright posture.   Standing Balance on Airex: Bil Stance on Airex with intermittent UE support completed horizontal/vertical head turns x 10 reps each. Significant challenge with vertical > horizontal. Intermittent Min A  Completed standing balance working on lateral weight shift with opposite toe tap to target x 10 reps bilat, single UE support required. Unable to complete without UE support  Continued mass sit <> stand training working on improved forward lean and upright posture.     PATIENT EDUCATION: Education details: Continue HEP Person educated: Patient and Armed forces training and education officer method: Explanation; demonstration;  handout  Education comprehension: verbalized understanding; demonstrated understanding     GOALS: Goals reviewed with patient? Yes   SHORT TERM GOALS: Target date: 10/22/2021    Pt will be independent with initial HEP for improved balance and gaze stabilization  Baseline: no HEP established; Reports  independence with HEP not completing consistently however Goal status: PARTIALLY MET   2.  Berg Balance TBA and LTG to be set as applicable Baseline: TBA; Assessed on 10/04/21 Goal status: MET   3.  DVA TBA and LTG to be set as applicable Baseline: TBA; Deferred due to difficulty relaxing Goal status: Deferred   LONG TERM GOALS: Target date: 11/19/2021     Pt will be independent with final HEP for improved balance and gaze stabilization Baseline: to be established Goal status: INITIAL   2.  Pt will improve Berg Balance by 5 points from baseline to  demonstrate improved standing balance and reduced fall risk Baseline: TBA; 36/56 Goal status: INITIAL   3.  Pt will report </= 1/5 for all movements on MSQ to indicate improvement in motion sensitivity and improved activity tolerance.  Baseline: 2-3/5 Goal status: INITIAL   4.  LTG to be set for DVA as applicable Baseline: TBA Goal status: DEFERRED   5.  Pt will be able to ambulate >/= 500 ft with LRAD and supervision Baseline: CGA with ambulation  Goal status: INITIAL   6.  Pt will improve DPS to >/= 53% Baseline: 46% Goal status: INITIAL   ASSESSMENT:   CLINICAL IMPRESSION: Today's skilled PT session focused on continued standing balance and activity tolernace. Continue to require frequent cues for posture, with increased shuffled steps noted today. CGA throughout. Will continue per POC.   OBJECTIVE IMPAIRMENTS Abnormal gait, decreased activity tolerance, decreased balance, decreased endurance, decreased knowledge of use of DME, difficulty walking, decreased strength, decreased safety awareness, dizziness, impaired sensation, postural dysfunction, and pain.    ACTIVITY LIMITATIONS lifting, bending, standing, stairs, transfers, and locomotion level   PARTICIPATION LIMITATIONS: cleaning, medication management, and community activity   PERSONAL FACTORS  Age, Time since onset of injury/illness/exacerbation, and 3+ comorbidities: multiple falls with + head trauma, hypertension, diabetes, hyperlipidemia, prostate cancer, anxiety  are also affecting patient's functional outcome.    REHAB POTENTIAL: Good   CLINICAL DECISION MAKING: Evolving/moderate complexity   EVALUATION COMPLEXITY: Moderate     PLAN: PT FREQUENCY: 2x/week   PT DURATION: 8 weeks   PLANNED INTERVENTIONS: Therapeutic exercises, Therapeutic activity, Neuromuscular re-education, Balance training, Gait training, Patient/Family education, Joint manipulation, Joint mobilization, Stair training, Vestibular training,  Canalith repositioning, Orthotic/Fit training, DME instructions, Electrical stimulation, Cryotherapy, Moist heat, and Manual therapy   PLAN FOR NEXT SESSION:  Begin to talk to patient about neurology follow? Will need to check goals + Re-cert or d/c?  Continue activities to promote step length, upright posture, improved stepping strategy. Work on visual scanning with mobility, patient having increased tendency to veer to R with RW. Standing balance and strengthening. Exercises to promote improved posture.    Kirtland Bouchard, PT, DPT 11/10/2021, 2:07 PM

## 2021-11-15 ENCOUNTER — Encounter: Payer: Self-pay | Admitting: Physical Therapy

## 2021-11-15 ENCOUNTER — Ambulatory Visit: Payer: Medicare Other | Admitting: Physical Therapy

## 2021-11-15 VITALS — BP 127/72 | HR 72

## 2021-11-15 DIAGNOSIS — R2681 Unsteadiness on feet: Secondary | ICD-10-CM | POA: Diagnosis not present

## 2021-11-15 DIAGNOSIS — R2689 Other abnormalities of gait and mobility: Secondary | ICD-10-CM

## 2021-11-15 DIAGNOSIS — R293 Abnormal posture: Secondary | ICD-10-CM

## 2021-11-15 DIAGNOSIS — R42 Dizziness and giddiness: Secondary | ICD-10-CM

## 2021-11-15 NOTE — Therapy (Signed)
OUTPATIENT PHYSICAL THERAPY VESTIBULAR TREATMENT NOTE   Patient Name: Logan Hobbs MRN: [id number] DOB:[birth year], 87 y.o., male Today's Date: 11/15/2021  PCP: Marda Stalker, PA-C REFERRING PROVIDER: Leta Baptist, MD   PT End of Session - 11/15/21 1022     Visit Number 12    Number of Visits 17    Date for PT Re-Evaluation 11/19/21    Authorization Type Medicare A+B/BCBS    PT Start Time 1019   pt late to appt   PT Stop Time 1100    PT Time Calculation (min) 41 min    Equipment Utilized During Treatment Gait belt    Activity Tolerance Patient tolerated treatment well;Patient limited by fatigue    Behavior During Therapy [facility] for tasks assessed/performed             Past Medical History:  Diagnosis Date   Abrasion 02/10/2021   Dec 25, 2020 Entered By: Ree Kida Comment: Status post fall   Allergic rhinitis 02/02/2011   Allergic rhinitis due to pollen 02/26/2021   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 Followed by Dr. Farris Has at [facility].   Cataract 09/18/2020   Change in bowel habit 09/02/2021   Chronic idiopathic constipation 02/26/2021   Cognitive disorder 02/10/2021   Colon polyp 02/13/2012   Dec. 16, 2011 and next one due 5 years   Constipation 09/18/2020   Diabetes mellitus with coincident hypertension ([place]) 03/31/2017   Diabetes mellitus with neuropathy ([place]) 02/14/2013   Diabetic peripheral neuropathy associated with type 2 diabetes mellitus ([place]) 02/26/2021   Diarrhea  09/02/2021   Dizziness    Encounter for fitting and adjustment of hearing aid 09/18/2020   Essential hypertension 05/16/2018   Falls    Gait abnormality 03/15/2015   Followed by Dr. Yolonda Kida with [facility]. He does have neuropathy. Dr. Audley Hose note 07/03/14 says etiology of apractic gait unclear. Ongoing neurology follow up.   Gout 02/26/2021   Hardening of the aorta (main artery of the heart) ([place]) 02/26/2021   Hearing loss 05/16/2018   History of fall 02/10/2021   History of malignant neoplasm of prostate 02/26/2021   Hyperlipidemia 05/16/2018   Impacted cerumen, bilateral 09/18/2020   Intraventricular hemorrhage ([place]) 05/06/2021   Lumbar degenerative disc disease 03/15/2015   Mild multilevel degenerative disc changes on MRI of the lumbar spine ordered by Dr. Yolonda Kida, done 06/15/14   Male erectile dysfunction, unspecified 09/13/2016   Malignant neoplasm of prostate ([place]) 10/26/2010   Need for prophylactic vaccination and inoculation against influenza 09/18/2020   Neuropathy 06/20/2014   Obesity 09/18/2020   Onychomycosis 05/16/2018   Osteoarthritis 05/17/2013   Left shoulder   Other abnormalities of gait and mobility 02/10/2021   Pre-operative cardiovascular examination 05/16/2018   Pure hypercholesterolemia 02/26/2021   Rash and other nonspecific skin eruption 09/18/2020   Recurrent falls 02/26/2021   Ringing in ears 09/18/2020   Senile nuclear sclerosis 01/30/2013   Sensorineural hearing loss, bilateral 09/18/2020   Syncope and collapse 12/21/2020   Tinea cruris 05/16/2018   Tremor 02/10/2021   Trochanteric  bursitis of right hip 05/28/2019   Type 2 diabetes mellitus without complications ([place]) 62/70/3500   Vertigo 05/16/2018   Vitamin D insufficiency 05/27/2013   Past Surgical History:  Procedure Laterality Date   BLADDER SURGERY     bladder neck contracture repair   CATARACT EXTRACTION Left 2014   PROSTATE SURGERY  2009   TONSILLECTOMY     VASECTOMY  1978    WISDOM TOOTH EXTRACTION     Patient Active Problem List   Diagnosis Date Noted   Dizziness 09/02/2021   Falls 09/02/2021   Change in bowel habit 09/02/2021   Diarrhea 09/02/2021   Intraventricular hemorrhage ([place]) 05/06/2021   Allergic rhinitis due to pollen 02/26/2021   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   Atherosclerosis of both carotid arteries 02/26/2021   Chronic idiopathic constipation 02/26/2021   Diabetic peripheral neuropathy associated with type 2 diabetes mellitus ([place]) 02/26/2021   Gout 02/26/2021   Hardening of the aorta (main artery of the heart) ([place]) 02/26/2021   History of malignant neoplasm of prostate 02/26/2021   Pure hypercholesterolemia 02/26/2021   Recurrent falls 02/26/2021   Abrasion 02/10/2021   Cognitive disorder 02/10/2021   History of fall 02/10/2021   Other abnormalities of gait and mobility 02/10/2021   Tremor 02/10/2021   Syncope and collapse 12/21/2020   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   Cataract 09/18/2020   Constipation 09/18/2020   Encounter for fitting and adjustment of hearing aid 09/18/2020   Impacted cerumen, bilateral 09/18/2020   Need for prophylactic vaccination and inoculation against influenza 09/18/2020   Obesity 09/18/2020   Rash and other nonspecific skin eruption 09/18/2020   Ringing in ears 09/18/2020   Sensorineural hearing loss, bilateral 09/18/2020   Type 2 diabetes mellitus without complications ([place]) 93/81/8299   Trochanteric bursitis of right hip 05/28/2019   Cancer ([place]) 05/16/2018   Hearing loss 05/16/2018   Hyperlipidemia 05/16/2018   Onychomycosis 05/16/2018   Tinea cruris 05/16/2018   Vertigo 05/16/2018   Pre-operative cardiovascular examination 05/16/2018   Essential hypertension 05/16/2018   Diabetes mellitus with coincident hypertension ([place]) 03/31/2017   Blood pressure alteration 12/15/2016   Benign localized prostatic hyperplasia without lower urinary tract symptoms (LUTS) 09/13/2016    Male erectile dysfunction, unspecified 09/13/2016   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/04/2016   Gait abnormality 03/15/2015   Lumbar degenerative disc disease 03/15/2015   Neuropathy 06/20/2014   Vitamin D insufficiency 05/27/2013   Osteoarthritis 05/17/2013   Diabetes mellitus with neuropathy ([place]) 02/14/2013   Senile nuclear sclerosis 01/30/2013   Anxiety 12/13/2012   Colon polyp 02/13/2012   Allergic rhinitis 02/02/2011   Malignant neoplasm of prostate ([place]) 10/26/2010    ONSET DATE: 09/15/2021  REFERRING DIAG: R42 (ICD-10-CM) - Dizziness  THERAPY DIAG:  Other abnormalities of gait and mobility  Unsteadiness on feet  Abnormal posture  Dizziness and giddiness  Rationale for Evaluation and Treatment Rehabilitation  PERTINENT HISTORY: PMH includes multiple falls with + head trauma, hypertension, diabetes, hyperlipidemia, prostate cancer, anxiety. CT (January)  revealed mild layering of hyperdense blood within occipital horns of bilat lateral ventricles, no IPH/SDH/SAH; IVH favored secondary to HTN  PRECAUTIONS: Fall  SUBJECTIVE: No falls, nothing new. Feeling more steady with his walking since beginning therapy. Reports exercises are going ok at home, they are still challenging.   PAIN:  Are you having pain? No  Today's Vitals   11/15/21 1037  BP: 127/72  Pulse: 72    There is no height or weight on file to calculate BMI.  OBJECTIVE:  Goal Assessment:   GAIT: Gait pattern: step through pattern, decreased arm swing- Right, decreased arm swing- Left, decreased step length- Right, decreased step length- Left, trunk flexed, poor foot clearance- Right, and poor foot clearance- Left Distance walked: 460'  Assistive device utilized: RW Level of assistance: SBA/CGA (more CGA after 1st lap of ambulation) Comments: Cues for posture, stride length and staying closer to RW throughout gait with activities (pt tends to push it too far anteriorly). More shuffled steps and having decr  foot clearance at times with RLE>LLE, needing cues for heel strike. Tendency to veer towards the R. Needs cues at times to not bump into objects on R.   Pt reporting feeling dizzy and tired afterwards, needing a prolonged seated rest break and water.   BP after gait: 149/76, HR: 79 bpm   MOTION SENSITIVITY:                         Motion Sensitivity Quotient   Intensity: 0 = none, 1 = Lightheaded, 2 = Mild, 3 = Moderate, 4 = Severe, 5 = Vomiting   Intensity  1. Sitting to supine  0 (feels ringing in his ears)  2. Supine to L side  0 "feels a little blurry when moving"   3. Supine to R side  0 "feels a little blurry when moving"   4. Supine to sitting  1  5. L [place]    6. Up from L     7. R [place]    8. Up from R     9. Sitting, head  tipped to L knee 0 "feels a little blurry"   10. Head up from L  knee 0 "feels a little blurry"   11. Sitting, head  tipped to R knee 0 "feels a little blurry"   12. Head up from R  knee 0 "feels a little blurry"   13. Sitting head turns x5 0   14.Sitting head nods x5 1  15. In stance, 180  turn to L     16. In stance, 180  turn to R        FOTO:  DPS: 52 DFS: 53.1   PATIENT EDUCATION: Education details: Results of LTGs, will finish checking LTGs at next session and determine plan going forwards; either re-cert or to D/C at this time and take a break from therapy and for pt to have follow up with neurologist.  Person educated: Patient and Caregiver Education method: Explanation; Education comprehension: verbalized understanding     GOALS: Goals reviewed with patient? Yes   SHORT TERM GOALS: Target date: 10/22/2021    Pt will be independent with initial HEP for improved balance and gaze stabilization  Baseline: no HEP established; Reports  independence with HEP not completing consistently however Goal status: PARTIALLY MET   2.  Berg Balance TBA and LTG to be set as applicable Baseline: TBA; Assessed on  10/04/21 Goal status: MET   3.  DVA TBA and LTG to be set as applicable Baseline: TBA; Deferred due to difficulty relaxing Goal status: Deferred   LONG TERM GOALS: Target date: 11/19/2021     Pt will be independent with final HEP for improved balance and gaze stabilization Baseline: to be established Goal status: INITIAL   2.  Pt will improve Berg Balance by 5 points from baseline to demonstrate improved standing balance and reduced fall risk Baseline: TBA; 36/56 Goal status: INITIAL   3.  Pt will report </= 1/5 for all movements on MSQ to indicate improvement in motion sensitivity and improved activity tolerance.  Baseline: 2-3/5; 1/5 or less on 11/15/21 Goal status: MET   4.  LTG to be set for DVA as applicable Baseline: TBA Goal status: DEFERRED   5.  Pt will be able to ambulate >/= 500 ft with LRAD and supervision Baseline: Supervision/ CGA with ambulation  Goal status: NOT MET   6.  Pt will improve DPS to >/= 53% Baseline: 46%; 52% on 11/15/21 Goal status: PARTIALLY MET   ASSESSMENT:   CLINICAL IMPRESSION: Today's skilled session focused on beginning to check pt's LTGs. Pt met goal in regards to MSQ, pt not having any dizziness or only having some lightheadedness with activities. Pt's main compliant was that his eyes felt blurry. Pt did not meet LTG #5, pt unable to ambulate 500' with RW and pt needing CGA>supervision with gait and cues for posture, stride length and foot clearance throughout esp with RLE. Pt improved FOTO scores, indicating improved functional outcomes in regards to dizziness, but not quite to goal level. Will finish assessing LTGs at next session and primary PT to determine re-cert vs. D/C.   OBJECTIVE IMPAIRMENTS Abnormal gait, decreased activity tolerance, decreased balance, decreased endurance, decreased knowledge of use of DME, difficulty walking, decreased strength, decreased safety awareness, dizziness, impaired sensation, postural dysfunction, and  pain.    ACTIVITY LIMITATIONS lifting, bending, standing, stairs, transfers, and locomotion level   PARTICIPATION LIMITATIONS: cleaning, medication management, and community activity   PERSONAL FACTORS Age, Time since onset of injury/illness/exacerbation, and 3+ comorbidities: multiple falls with + head trauma, hypertension, diabetes, hyperlipidemia, prostate cancer, anxiety  are also affecting patient's functional outcome.    REHAB POTENTIAL: Good   CLINICAL DECISION MAKING: Evolving/moderate complexity   EVALUATION COMPLEXITY: Moderate     PLAN: PT FREQUENCY: 2x/week   PT DURATION: 8 weeks   PLANNED INTERVENTIONS: Therapeutic exercises, Therapeutic activity, Neuromuscular re-education, Balance training, Gait training, Patient/Family education, Joint manipulation, Joint mobilization, Stair training, Vestibular training, Canalith repositioning, Orthotic/Fit training, DME instructions, Electrical stimulation, Cryotherapy, Moist heat, and Manual therapy   PLAN FOR NEXT SESSION:  Begin to talk to patient about neurology follow? Will need to check goals + Re-cert or d/c? Check remainder of goals and determine plan.    Arliss Journey, PT, DPT 11/15/2021, 11:59 AM

## 2021-11-17 ENCOUNTER — Ambulatory Visit: Payer: Medicare Other

## 2021-11-17 VITALS — BP 120/79 | HR 80

## 2021-11-17 DIAGNOSIS — R2689 Other abnormalities of gait and mobility: Secondary | ICD-10-CM

## 2021-11-17 DIAGNOSIS — R293 Abnormal posture: Secondary | ICD-10-CM

## 2021-11-17 DIAGNOSIS — R2681 Unsteadiness on feet: Secondary | ICD-10-CM

## 2021-11-17 DIAGNOSIS — R262 Difficulty in walking, not elsewhere classified: Secondary | ICD-10-CM

## 2021-11-17 DIAGNOSIS — R42 Dizziness and giddiness: Secondary | ICD-10-CM

## 2021-11-17 NOTE — Therapy (Deleted)
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 Followed by Dr. Farris Has at [facility].   Cataract 09/18/2020   Change in bowel habit 09/02/2021   Chronic idiopathic constipation 02/26/2021   Cognitive disorder 02/10/2021   Colon polyp 02/13/2012   Dec. 16, 2011 and next one due 5 years   Constipation 09/18/2020   Diabetes mellitus with coincident hypertension ([place]) 03/31/2017   Diabetes mellitus with neuropathy ([place]) 02/14/2013   Diabetic peripheral neuropathy associated with type 2 diabetes mellitus ([place]) 02/26/2021   Diarrhea 09/02/2021   Dizziness    Encounter for fitting and adjustment of hearing aid 09/18/2020   Essential hypertension 05/16/2018   Falls    Gait abnormality 03/15/2015   Followed by Dr. Yolonda Kida with [facility]. He does have neuropathy. Dr. Audley Hose note 07/03/14 says etiology of apractic gait unclear. Ongoing neurology follow up.   Gout 02/26/2021   Hardening of the aorta (main artery of the heart) ([place]) 02/26/2021   Hearing loss 05/16/2018   History  of fall 02/10/2021   History of malignant neoplasm of prostate 02/26/2021   Hyperlipidemia 05/16/2018   Impacted cerumen, bilateral 09/18/2020   Intraventricular hemorrhage ([place]) 05/06/2021   Lumbar degenerative disc disease 03/15/2015   Mild multilevel degenerative disc changes on MRI of the lumbar spine ordered by Dr. Yolonda Kida, done 06/15/14   Male erectile dysfunction, unspecified 09/13/2016   Malignant neoplasm of prostate ([place]) 10/26/2010   Need for prophylactic vaccination and inoculation against influenza 09/18/2020   Neuropathy 06/20/2014   Obesity 09/18/2020   Onychomycosis 05/16/2018   Osteoarthritis 05/17/2013   Left shoulder   Other abnormalities of gait and mobility 02/10/2021   Pre-operative cardiovascular examination 05/16/2018   Pure hypercholesterolemia 02/26/2021   Rash and other nonspecific skin eruption 09/18/2020   Recurrent falls 02/26/2021   Ringing in ears 09/18/2020   Senile nuclear sclerosis 01/30/2013   Sensorineural hearing loss, bilateral 09/18/2020   Syncope and collapse 12/21/2020   Tinea cruris 05/16/2018   Tremor 02/10/2021   Trochanteric bursitis of right hip 05/28/2019   Type 2 diabetes mellitus without complications ([place]) 33/29/5188   Vertigo 05/16/2018   Vitamin D insufficiency 05/27/2013   Past Surgical History:  Procedure Laterality Date   BLADDER SURGERY     bladder neck contracture repair   CATARACT EXTRACTION Left 2014   PROSTATE SURGERY  2009   TONSILLECTOMY     VASECTOMY  1978   WISDOM TOOTH EXTRACTION     Patient Active Problem List   Diagnosis Date Noted   Dizziness 09/02/2021   Falls 09/02/2021   Change in bowel habit 09/02/2021   Diarrhea 09/02/2021  Intraventricular hemorrhage ([place]) 05/06/2021   Allergic rhinitis due to pollen 02/26/2021   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   Atherosclerosis of both carotid arteries 02/26/2021   Chronic idiopathic constipation 02/26/2021   Diabetic peripheral neuropathy associated  with type 2 diabetes mellitus ([place]) 02/26/2021   Gout 02/26/2021   Hardening of the aorta (main artery of the heart) ([place]) 02/26/2021   History of malignant neoplasm of prostate 02/26/2021   Pure hypercholesterolemia 02/26/2021   Recurrent falls 02/26/2021   Abrasion 02/10/2021   Cognitive disorder 02/10/2021   History of fall 02/10/2021   Other abnormalities of gait and mobility 02/10/2021   Tremor 02/10/2021   Syncope and collapse 12/21/2020   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   Cataract 09/18/2020   Constipation 09/18/2020   Encounter for fitting and adjustment of hearing aid 09/18/2020   Impacted cerumen, bilateral 09/18/2020   Need for prophylactic vaccination and inoculation against influenza 09/18/2020   Obesity 09/18/2020   Rash and other nonspecific skin eruption 09/18/2020   Ringing in ears 09/18/2020   Sensorineural hearing loss, bilateral 09/18/2020   Type 2 diabetes mellitus without complications ([place]) 88/32/5498   Trochanteric bursitis of right hip 05/28/2019   Cancer ([place]) 05/16/2018   Hearing loss 05/16/2018   Hyperlipidemia 05/16/2018   Onychomycosis 05/16/2018   Tinea cruris 05/16/2018   Vertigo 05/16/2018   Pre-operative cardiovascular examination 05/16/2018   Essential hypertension 05/16/2018   Diabetes mellitus with coincident hypertension ([place]) 03/31/2017   Blood pressure alteration 12/15/2016   Benign localized prostatic hyperplasia without lower urinary tract symptoms (LUTS) 09/13/2016   Male erectile dysfunction, unspecified 09/13/2016   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/04/2016   Gait abnormality 03/15/2015   Lumbar degenerative disc disease 03/15/2015   Neuropathy 06/20/2014   Vitamin D insufficiency 05/27/2013   Osteoarthritis 05/17/2013   Diabetes mellitus with neuropathy ([place]) 02/14/2013   Senile nuclear sclerosis 01/30/2013   Anxiety 12/13/2012   Colon polyp 02/13/2012   Allergic rhinitis 02/02/2011   Malignant neoplasm of prostate  ([place]) 10/26/2010    ONSET DATE: 09/15/2021  REFERRING DIAG: R42 (ICD-10-CM) - Dizziness  THERAPY DIAG:  No diagnosis found.  Rationale for Evaluation and Treatment Rehabilitation  PERTINENT HISTORY: PMH includes multiple falls with + head trauma, hypertension, diabetes, hyperlipidemia, prostate cancer, anxiety. CT (January)  revealed mild layering of hyperdense blood within occipital horns of bilat lateral ventricles, no IPH/SDH/SAH; IVH favored secondary to HTN  PRECAUTIONS: Fall  SUBJECTIVE: ***  PAIN:  Are you having pain? No  There were no vitals filed for this visit.   There is no height or weight on file to calculate BMI.  OBJECTIVE:     GAIT: Gait pattern: step through pattern, decreased arm swing- Right, decreased arm swing- Left, decreased step length- Right, decreased step length- Left, trunk flexed, poor foot clearance- Right, and poor foot clearance- Left Distance walked: ***  Assistive device utilized: RW Level of assistance: SBA/CGA (more CGA after 1st lap of ambulation) Comments: Cues for posture, stride length and staying closer to RW throughout gait with activities (pt tends to push it too far anteriorly). More shuffled steps and having decr foot clearance at times, needing cues for heel strike.   PATIENT EDUCATION: Education details: Results of LTG;  Person educated: Patient and Caregiver Education method: Explanation; Education comprehension: verbalized understanding     GOALS: Goals reviewed with patient? Yes   SHORT TERM GOALS: Target date: 10/22/2021    Pt will be independent with initial HEP for improved balance and  gaze stabilization  Baseline: no HEP established; Reports  independence with HEP not completing consistently however Goal status: PARTIALLY MET   2.  Berg Balance TBA and LTG to be set as applicable Baseline: TBA; Assessed on 10/04/21 Goal status: MET   3.  DVA TBA and LTG to be set as applicable Baseline: TBA; Deferred due to  difficulty relaxing Goal status: Deferred   LONG TERM GOALS: Target date: 11/19/2021     Pt will be independent with final HEP for improved balance and gaze stabilization Baseline: to be established Goal status: INITIAL   2.  Pt will improve Berg Balance by 5 points from baseline to demonstrate improved standing balance and reduced fall risk Baseline: TBA; 36/56 Goal status: INITIAL   3.  Pt will report </= 1/5 for all movements on MSQ to indicate improvement in motion sensitivity and improved activity tolerance.  Baseline: 2-3/5; 1/5 or less on 11/15/21 Goal status: MET   4.  LTG to be set for DVA as applicable Baseline: TBA Goal status: DEFERRED   5.  Pt will be able to ambulate >/= 500 ft with LRAD and supervision Baseline: Supervision/ CGA with ambulation  Goal status: NOT MET   6.  Pt will improve DPS to >/= 53% Baseline: 46%; 52% on 11/15/21 Goal status: PARTIALLY MET   ASSESSMENT:   CLINICAL IMPRESSION: Finished assessment of patient's progress toward LTGs.  Patient scored /56 on the Berg Balance  OBJECTIVE IMPAIRMENTS Abnormal gait, decreased activity tolerance, decreased balance, decreased endurance, decreased knowledge of use of DME, difficulty walking, decreased strength, decreased safety awareness, dizziness, impaired sensation, postural dysfunction, and pain.    ACTIVITY LIMITATIONS lifting, bending, standing, stairs, transfers, and locomotion level   PARTICIPATION LIMITATIONS: cleaning, medication management, and community activity   PERSONAL FACTORS Age, Time since onset of injury/illness/exacerbation, and 3+ comorbidities: multiple falls with + head trauma, hypertension, diabetes, hyperlipidemia, prostate cancer, anxiety  are also affecting patient's functional outcome.    REHAB POTENTIAL: Good   CLINICAL DECISION MAKING: Evolving/moderate complexity   EVALUATION COMPLEXITY: Moderate     PLAN: PT FREQUENCY: 2x/week   PT DURATION: 8 weeks   PLANNED  INTERVENTIONS: Therapeutic exercises, Therapeutic activity, Neuromuscular re-education, Balance training, Gait training, Patient/Family education, Joint manipulation, Joint mobilization, Stair training, Vestibular training, Canalith repositioning, Orthotic/Fit training, DME instructions, Electrical stimulation, Cryotherapy, Moist heat, and Manual therapy   PLAN FOR NEXT SESSION: ***   Kirtland Bouchard, PT, DPT 11/17/2021, 12:11 PM

## 2021-11-17 NOTE — Therapy (Signed)
OUTPATIENT PHYSICAL THERAPY VESTIBULAR TREATMENT NOTE/DISCHARGE SUMMARY   Patient Name: Logan Hobbs MRN: [id number] DOB:Mar 01, 1935, 87 y.o., male Today's Date: 11/17/2021  PCP: Marda Stalker, PA-C REFERRING PROVIDER: Leta Baptist, MD  PHYSICAL THERAPY DISCHARGE SUMMARY  Visits from Start of Care: 13  Current functional level related to goals / functional outcomes: See Clinical Impression Statement   Remaining deficits: Abnormal Posture; Decreased Balance; Fall Risk   Education / Equipment: HEP; Insurance claims handler (walking/stationary bike); Fall Risk   Patient agrees to discharge. Patient goals were partially met. Patient is being discharged due to maximized rehab potential.     PT End of Session - 11/17/21 1315     Visit Number 13    Number of Visits 17    Date for PT Re-Evaluation 11/19/21    Authorization Type Medicare A+B/BCBS    PT Start Time 1315    PT Stop Time 1400    PT Time Calculation (min) 45 min    Equipment Utilized During Treatment Gait belt    Activity Tolerance Patient tolerated treatment well;Patient limited by fatigue    Behavior During Therapy [facility] for tasks assessed/performed              Past Medical History:  Diagnosis Date   Abrasion 02/10/2021   Dec 25, 2020 Entered By: Ree Kida Comment: Status post fall   Allergic rhinitis 02/02/2011   Allergic rhinitis due to pollen 02/26/2021   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 Followed by Dr. Farris Has at  [facility].   Cataract 09/18/2020   Change in bowel habit 09/02/2021   Chronic idiopathic constipation 02/26/2021   Cognitive disorder 02/10/2021   Colon polyp 02/13/2012   Dec. 16, 2011 and next one due 5 years   Constipation 09/18/2020   Diabetes mellitus with coincident hypertension ([place]) 03/31/2017   Diabetes mellitus with neuropathy ([place]) 02/14/2013   Diabetic peripheral neuropathy associated with type 2 diabetes mellitus ([place]) 02/26/2021   Diarrhea 09/02/2021   Dizziness    Encounter for fitting and adjustment of hearing aid 09/18/2020   Essential hypertension 05/16/2018   Falls    Gait abnormality 03/15/2015   Followed by Dr. Yolonda Kida with [facility]. He does have neuropathy. Dr. Audley Hose note 07/03/14 says etiology of apractic gait unclear. Ongoing neurology follow up.   Gout 02/26/2021   Hardening of the aorta (main artery of the heart) ([place]) 02/26/2021   Hearing loss 05/16/2018   History of fall 02/10/2021   History of malignant neoplasm of prostate 02/26/2021   Hyperlipidemia 05/16/2018   Impacted cerumen, bilateral 09/18/2020   Intraventricular hemorrhage ([place]) 05/06/2021   Lumbar degenerative disc disease 03/15/2015   Mild multilevel degenerative disc changes on MRI of the lumbar spine ordered by Dr. Yolonda Kida, done 06/15/14   Male erectile dysfunction, unspecified 09/13/2016   Malignant neoplasm of prostate ([employer]) 10/26/2010   Need for prophylactic vaccination and inoculation against influenza 09/18/2020   Neuropathy 06/20/2014   Obesity 09/18/2020   Onychomycosis 05/16/2018   Osteoarthritis 05/17/2013   Left shoulder   Other abnormalities  of gait and mobility 02/10/2021   Pre-operative cardiovascular examination 05/16/2018   Pure hypercholesterolemia 02/26/2021   Rash and other nonspecific skin eruption 09/18/2020   Recurrent falls 02/26/2021   Ringing in ears 09/18/2020   Senile nuclear sclerosis 01/30/2013   Sensorineural hearing loss, bilateral 09/18/2020    Syncope and collapse 12/21/2020   Tinea cruris 05/16/2018   Tremor 02/10/2021   Trochanteric bursitis of right hip 05/28/2019   Type 2 diabetes mellitus without complications ([place]) 09/23/5613   Vertigo 05/16/2018   Vitamin D insufficiency 05/27/2013   Past Surgical History:  Procedure Laterality Date   BLADDER SURGERY     bladder neck contracture repair   CATARACT EXTRACTION Left 2014   PROSTATE SURGERY  2009   TONSILLECTOMY     VASECTOMY  1978   WISDOM TOOTH EXTRACTION     Patient Active Problem List   Diagnosis Date Noted   Dizziness 09/02/2021   Falls 09/02/2021   Change in bowel habit 09/02/2021   Diarrhea 09/02/2021   Intraventricular hemorrhage ([place]) 05/06/2021   Allergic rhinitis due to pollen 02/26/2021   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   Atherosclerosis of both carotid arteries 02/26/2021   Chronic idiopathic constipation 02/26/2021   Diabetic peripheral neuropathy associated with type 2 diabetes mellitus ([place]) 02/26/2021   Gout 02/26/2021   Hardening of the aorta (main artery of the heart) ([place]) 02/26/2021   History of malignant neoplasm of prostate 02/26/2021   Pure hypercholesterolemia 02/26/2021   Recurrent falls 02/26/2021   Abrasion 02/10/2021   Cognitive disorder 02/10/2021   History of fall 02/10/2021   Other abnormalities of gait and mobility 02/10/2021   Tremor 02/10/2021   Syncope and collapse 12/21/2020   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   Cataract 09/18/2020   Constipation 09/18/2020   Encounter for fitting and adjustment of hearing aid 09/18/2020   Impacted cerumen, bilateral 09/18/2020   Need for prophylactic vaccination and inoculation against influenza 09/18/2020   Obesity 09/18/2020   Rash and other nonspecific skin eruption 09/18/2020   Ringing in ears 09/18/2020   Sensorineural hearing loss, bilateral 09/18/2020   Type 2 diabetes mellitus without complications ([place]) 37/94/3276   Trochanteric bursitis of right hip  05/28/2019   Cancer ([place]) 05/16/2018   Hearing loss 05/16/2018   Hyperlipidemia 05/16/2018   Onychomycosis 05/16/2018   Tinea cruris 05/16/2018   Vertigo 05/16/2018   Pre-operative cardiovascular examination 05/16/2018   Essential hypertension 05/16/2018   Diabetes mellitus with coincident hypertension ([place]) 03/31/2017   Blood pressure alteration 12/15/2016   Benign localized prostatic hyperplasia without lower urinary tract symptoms (LUTS) 09/13/2016   Male erectile dysfunction, unspecified 09/13/2016   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/04/2016   Gait abnormality 03/15/2015   Lumbar degenerative disc disease 03/15/2015   Neuropathy 06/20/2014   Vitamin D insufficiency 05/27/2013   Osteoarthritis 05/17/2013   Diabetes mellitus with neuropathy ([place]) 02/14/2013   Senile nuclear sclerosis 01/30/2013   Anxiety 12/13/2012   Colon polyp 02/13/2012   Allergic rhinitis 02/02/2011   Malignant neoplasm of prostate ([place]) 10/26/2010    ONSET DATE: 09/15/2021  REFERRING DIAG: R42 (ICD-10-CM) - Dizziness  THERAPY DIAG:  Other abnormalities of gait and mobility  Unsteadiness on feet  Dizziness and giddiness  Difficulty in walking, not elsewhere classified  Abnormal posture  Rationale for Evaluation and Treatment Rehabilitation  PERTINENT HISTORY: PMH includes multiple falls with + head trauma, hypertension, diabetes, hyperlipidemia, prostate cancer, anxiety. CT (January)  revealed mild layering of hyperdense blood within occipital horns of bilat  lateral ventricles, no IPH/SDH/SAH; IVH favored secondary to HTN  PRECAUTIONS: Fall  SUBJECTIVE: Patient reports that he stated someone accidentally took his walker. No other new changes. Reports he is feeling better today than he was here last time. No pain.   PAIN:  Are you having pain? No  Today's Vitals   11/17/21 1327  BP: 120/79  Pulse: 80   There is no height or weight on file to calculate BMI.  OBJECTIVE:   [facility] PT Assessment -  11/17/21 0001       Standardized Balance Assessment   Standardized Balance Assessment Berg Balance Test      Berg Balance Test   Sit to Stand Able to stand  independently using hands    Standing Unsupported Able to stand safely 2 minutes    Sitting with Back Unsupported but Feet Supported on Floor or Stool Able to sit safely and securely 2 minutes    Stand to Sit Controls descent by using hands    Transfers Able to transfer safely, definite need of hands    Standing Unsupported with Eyes Closed Able to stand 10 seconds with supervision    Standing Unsupported with Feet Together Able to place feet together independently and stand for 1 minute with supervision    From Standing, Reach Forward with Outstretched Arm Can reach forward >12 cm safely (5")    From Standing Position, Pick up Object from Floor Able to pick up shoe, needs supervision    From Standing Position, Turn to Look Behind Over each Shoulder Looks behind one side only/other side shows less weight shift    Turn 360 Degrees Needs close supervision or verbal cueing    Standing Unsupported, Alternately Place Feet on Step/Stool Able to complete 4 steps without aid or supervision    Standing Unsupported, One Foot in Front Able to take small step independently and hold 30 seconds    Standing on One Leg Tries to lift leg/unable to hold 3 seconds but remains standing independently    Total Score 38    Berg comment: 38/56             GAIT: Gait pattern: step through pattern, decreased arm swing- Right, decreased arm swing- Left, decreased step length- Right, decreased step length- Left, trunk flexed, poor foot clearance- Right, and poor foot clearance- Left Distance walked: short distance within the therapy gym  Assistive device utilized: RW Level of assistance: SBA/CGA (more CGA after 1st lap of ambulation) Comments: Cues for posture, stride length and staying closer to RW throughout gait with standing (pt tends to push it too  far anteriorly). Some shuffled steps.   Due to patient's walker being misplaced PT educating on purchase options if it is not returned. Patient and caregiver verbalize understanding.   Reviewed HEP with patient and educated on compliance as well as options for completing endurance training, strength training within assisted living facility. (See [facility] for Details)  PATIENT EDUCATION: Education details: Results of LTG; Follow up with Neurologist Person educated: Patient and Caregiver Education method: Explanation; Education comprehension: verbalized understanding     GOALS: Goals reviewed with patient? Yes   SHORT TERM GOALS: Target date: 10/22/2021    Pt will be independent with initial HEP for improved balance and gaze stabilization  Baseline: no HEP established; Reports  independence with HEP not completing consistently however Goal status: PARTIALLY MET   2.  Berg Balance TBA and LTG to be set as applicable Baseline: TBA; Assessed on 10/04/21 Goal status:  MET   3.  DVA TBA and LTG to be set as applicable Baseline: TBA; Deferred due to difficulty relaxing Goal status: Deferred   LONG TERM GOALS: Target date: 11/19/2021     Pt will be independent with final HEP for improved balance and gaze stabilization Baseline: to be established; reports independence with final HEP Goal status: MET   2.  Pt will improve Berg Balance by 5 points from baseline to demonstrate improved standing balance and reduced fall risk Baseline: TBA; 36/56; 37/56 Goal status: NOT MET   3.  Pt will report </= 1/5 for all movements on MSQ to indicate improvement in motion sensitivity and improved activity tolerance.  Baseline: 2-3/5; 1/5 or less on 11/15/21 Goal status: MET   4.  LTG to be set for DVA as applicable Baseline: TBA Goal status: DEFERRED   5.  Pt will be able to ambulate >/= 500 ft with LRAD and supervision Baseline: Supervision/ CGA with ambulation  Goal status: NOT MET   6.  Pt  will improve DPS to >/= 53% Baseline: 46%; 52% on 11/15/21 Goal status: PARTIALLY MET   ASSESSMENT:   CLINICAL IMPRESSION: Finished assessment of patient's progress toward LTGs.  Patient scored 37/56 on the [facility], demonstrating one point improvement. Patient has made improvements in dizziness as noted on MSQ and verbal reports. However minimal improvements with gait and balance with functional mobility. Planned to d/c at this time due to limited progress and need to follow up with Neurology.   OBJECTIVE IMPAIRMENTS Abnormal gait, decreased activity tolerance, decreased balance, decreased endurance, decreased knowledge of use of DME, difficulty walking, decreased strength, decreased safety awareness, dizziness, impaired sensation, postural dysfunction, and pain.    ACTIVITY LIMITATIONS lifting, bending, standing, stairs, transfers, and locomotion level   PARTICIPATION LIMITATIONS: cleaning, medication management, and community activity   PERSONAL FACTORS Age, Time since onset of injury/illness/exacerbation, and 3+ comorbidities: multiple falls with + head trauma, hypertension, diabetes, hyperlipidemia, prostate cancer, anxiety  are also affecting patient's functional outcome.    REHAB POTENTIAL: Good   CLINICAL DECISION MAKING: Evolving/moderate complexity   EVALUATION COMPLEXITY: Moderate     PLAN: PT FREQUENCY: 2x/week   PT DURATION: 8 weeks   PLANNED INTERVENTIONS: Therapeutic exercises, Therapeutic activity, Neuromuscular re-education, Balance training, Gait training, Patient/Family education, Joint manipulation, Joint mobilization, Stair training, Vestibular training, Canalith repositioning, Orthotic/Fit training, DME instructions, Electrical stimulation, Cryotherapy, Moist heat, and Manual therapy   PLAN FOR NEXT SESSION: d/c this visit   Kirtland Bouchard, PT, DPT 11/17/2021, 3:41 PM

## 2021-11-18 ENCOUNTER — Telehealth: Payer: Self-pay | Admitting: *Deleted

## 2021-11-18 NOTE — Telephone Encounter (Signed)
LVM for daughter , Kyra Manges on [facility] and requested a call back to schedule patient for a followup.

## 2021-11-22 NOTE — Telephone Encounter (Signed)
Did not hear back from daughter, called patient and he and caregiver were on the phone. I advised him Dr Leta Baptist received a message from his PT. He wants to see patient. We scheduled for soonest caregiver can bring him.  Patient verbalized understanding, appreciation.

## 2021-12-06 ENCOUNTER — Ambulatory Visit (INDEPENDENT_AMBULATORY_CARE_PROVIDER_SITE_OTHER): Payer: Medicare Other | Admitting: Diagnostic Neuroimaging

## 2021-12-06 ENCOUNTER — Encounter: Payer: Self-pay | Admitting: Diagnostic Neuroimaging

## 2021-12-06 VITALS — BP 139/77 | HR 73 | Ht 70.5 in | Wt 195.0 lb

## 2021-12-06 DIAGNOSIS — E1142 Type 2 diabetes mellitus with diabetic polyneuropathy: Secondary | ICD-10-CM | POA: Diagnosis not present

## 2021-12-06 DIAGNOSIS — I2510 Atherosclerotic heart disease of native coronary artery without angina pectoris: Secondary | ICD-10-CM | POA: Diagnosis not present

## 2021-12-06 DIAGNOSIS — R269 Unspecified abnormalities of gait and mobility: Secondary | ICD-10-CM | POA: Diagnosis not present

## 2021-12-06 DIAGNOSIS — R261 Paralytic gait: Secondary | ICD-10-CM

## 2021-12-06 NOTE — Progress Notes (Signed)
[facility]  PATIENT: Logan Hobbs DOB: Apr 02, 1935  REFERRING CLINICIAN: Marda Stalker, PA-C HISTORY FROM: patient  REASON FOR VISIT: follow up   HISTORICAL  CHIEF COMPLAINT:  Chief Complaint  Patient presents with   Spastic Gait    Rm 7 caregiver- Freda Munro "FU due to gait issues not improving, other symptoms observed by PT; Dr Deeann Saint assessment form May 2023"    HISTORY OF PRESENT ILLNESS:   UPDATE (12/06/21, VRP): Since last visit, doing about the same. Still with low back pain. Leg pain, gait diff. Results reviewed. No tremor. Mild memory issues.   PRIOR HPI: 87 year old male here for evaluation of gait and balance difficulty.  Patient has had gait balance difficulty since 2013, previous evaluated by outside neurologist.  He was diagnosed with diabetic neuropathy.  He has used cane, walker for several years.  Unfortunately his gait has continued to decline.  Now patient feeling stiffness in his legs.  He has had several major falls especially in August 2022 when he tripped, fell forward and landed on his chin on the concrete.  He went to the hospital for evaluation.  He had some headaches after this.  He returned to the hospital more recently a week ago due to increasing headaches.  He was found to have intraventricular hemorrhage, thought to be related to hypertensive cause.   REVIEW OF SYSTEMS: Full 14 system review of systems performed and negative with exception of: as per HPI.  ALLERGIES: Allergies  Allergen Reactions   Latex Shortness Of Breath    Skin itching and breathing problems Other reaction(s): rash   Ciprofloxacin Other (See Comments)    constipation   Seasonal Ic [Cholestatin] Other (See Comments)    Unknown reaction   Solifenacin Succinate Other (See Comments)    constipation   Tamsulosin Hcl     Weakness, vertigo, and nausea    HOME MEDICATIONS: Outpatient Medications Prior to Visit  Medication Sig Dispense Refill    acetaminophen (TYLENOL) 500 MG tablet Take 1,000 mg by mouth in the morning and at bedtime.     Cholecalciferol (D3-1000 PO) Take 2,000 mg by mouth every evening.     cycloSPORINE (RESTASIS) 0.05 % ophthalmic emulsion Place 1 drop into both eyes 2 (two) times daily.     escitalopram (LEXAPRO) 5 MG tablet Take 5 mg by mouth every morning.     losartan (COZAAR) 50 MG tablet Take 50 mg by mouth daily.     ondansetron (ZOFRAN-ODT) 8 MG disintegrating tablet Take 1 tablet (8 mg total) by mouth every 8 (eight) hours as needed for nausea or vomiting. 20 tablet 0   Polyethylene Glycol 3350 (MIRALAX PO) Take 1 Dose by mouth 4 (four) times a week.     rosuvastatin (CRESTOR) 10 MG tablet Take 10 mg by mouth every morning.     sitaGLIPtin-metformin (JANUMET) 50-1000 MG tablet Take 1 tablet by mouth 2 (two) times daily.     vitamin B-12 (CYANOCOBALAMIN) 1000 MCG tablet Take 1,000 mcg by mouth daily.     No facility-administered medications prior to visit.    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 Followed by Dr. Farris Has at [facility].   Cataract 09/18/2020   Change in bowel habit 09/02/2021   Chronic idiopathic constipation 02/26/2021   Cognitive disorder 02/10/2021   Colon polyp 02/13/2012    Dec. 16, 2011 and next one due 5 years   Constipation 09/18/2020   Diabetes mellitus with coincident hypertension ([place]) 03/31/2017   Diabetes mellitus with neuropathy ([place]) 02/14/2013   Diabetic peripheral neuropathy associated with type 2 diabetes mellitus ([place]) 02/26/2021   Diarrhea 09/02/2021   Dizziness    Encounter for fitting and adjustment of hearing aid 09/18/2020   Essential hypertension 05/16/2018   Falls    Gait abnormality 03/15/2015   Followed by Dr. Yolonda Kida with [facility]. He does have neuropathy. Dr. Audley Hose note 07/03/14 says etiology of apractic gait unclear. Ongoing neurology follow up.   Gout 02/26/2021   Hardening of the aorta (main artery of the heart) ([place]) 02/26/2021   Hearing loss 05/16/2018   History of fall 02/10/2021   History of malignant neoplasm of prostate 02/26/2021   Hyperlipidemia 05/16/2018   Impacted cerumen, bilateral 09/18/2020   Intraventricular hemorrhage ([place]) 05/06/2021   Lumbar degenerative disc disease 03/15/2015   Mild multilevel degenerative disc changes on MRI of the lumbar spine ordered by Dr. Yolonda Kida, done 06/15/14   Male erectile dysfunction, unspecified 09/13/2016   Malignant neoplasm of prostate ([place]) 10/26/2010   Need for prophylactic vaccination and inoculation against influenza 09/18/2020   Neuropathy 06/20/2014   Obesity 09/18/2020   Onychomycosis 05/16/2018   Osteoarthritis 05/17/2013   Left shoulder   Other abnormalities of gait and mobility 02/10/2021   Pre-operative cardiovascular examination 05/16/2018   Pure hypercholesterolemia 02/26/2021   Rash and other nonspecific skin eruption 09/18/2020   Recurrent falls 02/26/2021   Ringing in ears 09/18/2020   Senile nuclear sclerosis 01/30/2013   Sensorineural hearing loss, bilateral 09/18/2020   Syncope and collapse 12/21/2020   Tinea cruris 05/16/2018   Tremor 02/10/2021   Trochanteric bursitis of right hip 05/28/2019   Type 2 diabetes mellitus without complications  ([place]) 27/78/2423   Vertigo 05/16/2018   Vitamin D insufficiency 05/27/2013    PAST SURGICAL HISTORY: Past Surgical History:  Procedure Laterality Date   BLADDER SURGERY     bladder neck contracture repair   CATARACT EXTRACTION Left 2014   PROSTATE SURGERY  2009   TONSILLECTOMY     VASECTOMY  1978   WISDOM TOOTH EXTRACTION      FAMILY HISTORY: Family History  Problem Relation Age of Onset   Heart disease Father    Hypertension Father    Heart disease Brother    Cancer Brother    Diabetes Neg Hx     SOCIAL HISTORY: Social History   Socioeconomic History   Marital status: Married    Spouse name: Not on file   Number of children: 1   Years of education: Not on file   Highest education level: Master's degree (e.g., MA, MS, MEng, MEd, MSW, MBA)  Occupational History    Comment: retired Chiropodist  Tobacco Use   Smoking status: Never   Smokeless tobacco: Never  Substance and Sexual Activity   Alcohol use: Not Currently  Alcohol/week: 7.0 standard drinks of alcohol    Types: 7 Cans of beer per week   Drug use: Never   Sexual activity: Not on file  Other Topics Concern   Not on file  Social History Narrative   06/09/21 lives at [facility]    Caffeine    Social Determinants of Health   Financial Resource Strain: Not on file  Food Insecurity: Not on file  Transportation Needs: Not on file  Physical Activity: Not on file  Stress: Not on file  Social Connections: Not on file  Intimate Partner Violence: Not on file     PHYSICAL EXAM  GENERAL EXAM/CONSTITUTIONAL: Vitals:  Vitals:   12/06/21 1302  BP: 139/77  Pulse: 73  Weight: 195 lb (88.5 kg)  Height: 5' 10.5" (1.791 m)   Body mass index is 27.58 kg/m. Wt Readings from Last 3 Encounters:  12/06/21 195 lb (88.5 kg)  10/22/21 195 lb (88.5 kg)  09/07/21 196 lb 1.3 oz (88.9 kg)   Patient is in no distress; well developed, nourished and groomed; neck is  supple  CARDIOVASCULAR: Examination of carotid arteries is normal; no carotid bruits Regular rate and rhythm, no murmurs Examination of peripheral vascular system by observation and palpation is normal  EYES: Ophthalmoscopic exam of optic discs and posterior segments is normal; no papilledema or hemorrhages No results found.  MUSCULOSKELETAL: Gait, strength, tone, movements noted in Neurologic exam below  NEUROLOGIC: MENTAL STATUS:      No data to display         awake, alert, oriented to person, place and time recent and remote memory intact normal attention and concentration language fluent, comprehension intact, naming intact fund of knowledge appropriate  CRANIAL NERVE:  2nd - no papilledema on fundoscopic exam 2nd, 3rd, 4th, 6th - pupils equal and reactive to light, visual fields full to confrontation, extraocular muscles intact, no nystagmus 5th - facial sensation symmetric 7th - facial strength symmetric 8th - hearing intact 9th - palate elevates symmetrically, uvula midline 11th - shoulder shrug symmetric 12th - tongue protrusion midline HOARSE VOICE  MOTOR:  normal bulk; INCREASED TONE IN RUE, LLE; MILD BRADYKINESIA BUE 5; BLE 5  SENSORY:  normal and symmetric to light touch, temperature, vibration; except decr in feet  COORDINATION:  finger-nose-finger, fine finger movements SLOW  REFLEXES:  deep tendon reflexes 1+ and symmetric  GAIT/STATION:  SHORT STEPS; VERY UNSTEADY; USING WALKER     DIAGNOSTIC DATA (LABS, IMAGING, TESTING) - I reviewed patient records, labs, notes, testing and imaging myself where available.  Lab Results  Component Value Date   WBC 8.7 05/06/2021   HGB 14.5 05/06/2021   HCT 41.5 05/06/2021   MCV 97.6 05/06/2021   PLT 246 05/06/2021      Component Value Date/Time   NA 132 (L) 05/06/2021 1456   NA 134 02/26/2021 1112   K 4.1 05/06/2021 1456   CL 95 (L) 05/06/2021 1456   CO2 28 05/06/2021 1456   GLUCOSE 169 (H)  05/06/2021 1456   BUN 14 05/06/2021 1456   BUN 11 02/26/2021 1112   CREATININE 0.90 05/06/2021 1456   CALCIUM 9.4 05/06/2021 1456   PROT 7.6 05/06/2021 1456   ALBUMIN 4.4 05/06/2021 1456   AST 17 05/06/2021 1456   ALT 13 05/06/2021 1456   ALKPHOS 35 (L) 05/06/2021 1456   BILITOT 0.8 05/06/2021 1456   GFRNONAA >60 05/06/2021 1456   No results found for: "CHOL", "HDL", "LDLCALC", "LDLDIRECT", "TRIG", "CHOLHDL" Lab Results  Component  Value Date   HGBA1C 7.8 (H) 12/22/2020   Lab Results  Component Value Date   VITAMINB12 1,065 05/12/2021   Lab Results  Component Value Date   TSH 0.647 12/21/2020    01/28/14 EMG/NCS This is an abnormal EMG and nerve conduction study of the upper and lower extremities. There is electrophysiologic evidence for minimal mixed sensorimotor polyneuropathy. There is electrophysiologic evidence for minimal left median neuropathy at the wrist and mild non-localizable left ulnar neuropathy. There is no electrophysiologic evidence for a radiculopathy, plexopathy, or myopathy.   12/21/20 CT head / cervical spine  1. No acute intracranial pathology. Small-vessel white matter disease. 2. No displaced fracture or dislocation of the facial bones. 3. Soft tissue contusion of the chin. 4. No fracture or static subluxation of the cervical spine. 5. Focally moderate disc space height loss and osteophytosis of C5-C6, with otherwise mild disc degenerative disease.   05/07/21 CT head  1. Unchanged intraventricular hemorrhage. 2. Unchanged ventriculomegaly associated with brain atrophy.   05/06/21 CTA head / neck 1. Negative CTA for large vessel occlusion. No aneurysm or other vascular malformation. 2. 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 3. No other hemodynamically significant or correctable stenosis about the major arterial vasculature of the head and neck.  05/28/21 MRI brain 1. No acute intracranial abnormality or mass. 2. Interval  resolution of intraventricular hemorrhage. 3. Mild chronic small vessel ischemic disease and moderately advanced cerebral atrophy.   05/26/21  MRI cervical spine (with and without) demonstrating: - At C3-4 disc bulging and uncovertebral joint hypertrophy with severe biforaminal stenosis. - At C4-5 disc bulging and uncovertebral joint hypertrophy with severe right foraminal stenosis. - At C5-6 disc bulging and uncovertebral joint hypertrophy with severe biforaminal stenosis.  05/26/21  Unremarkable MRI thoracic spine (with and without).   05/12/21 labs - paraneoplastic, B12, GAD antibody, copper, ceruloplasmin, zinc --> all unremarkable    ASSESSMENT AND PLAN  87 y.o. year old male here with longstanding gait and balance difficulty since 2013 likely related to diabetic neuropathy.  However has developed additional gait difficulty more recently with low back pain.   Dx:  1. Diabetic polyneuropathy associated with type 2 diabetes mellitus (HCC)   2. Gait difficulty   3. Spastic gait      PLAN:  GAIT DIFFICULTY (since 2013; due to long standing diabetic neuropathy; also with back pain and mild spastic gait) - check MRI lumbar spine and myopathy labs  - some subtle, mild parkinsonian symptoms (akinetic, rigid; also mild memory issues) --> consider trial of carb / levo in future  INTRAVENTRICULAR HEMORRHAGE (spontaneous vs post-traumatic) - stable; possible due to HTN vs traumatic  Orders Placed This Encounter  Procedures   MR LUMBAR SPINE WO CONTRAST   CK   Aldolase   Return for pending test results.    Penni Bombard, MD 9/79/4801, 6:55 PM Certified in Neurology, Neurophysiology and Neuroimaging  [facility] [address], [place], [place] [address] [phone]

## 2021-12-06 NOTE — Patient Instructions (Signed)
GAIT DIFFICULTY (since 2013; due to long standing diabetic neuropathy; also with back pain and mild spastic gait)  - check MRI lumbar spine and myopathy labs   - some subtle, mild parkinsonian symptoms (akinetic, rigid; also mild memory issues) --> consider trial of carb / levo in future

## 2021-12-07 ENCOUNTER — Telehealth: Payer: Self-pay | Admitting: Diagnostic Neuroimaging

## 2021-12-07 NOTE — Telephone Encounter (Signed)
Medicare/BCBS Fed NPR order sent to [facility], they will reach out to the patient to schedule.

## 2021-12-08 LAB — CK: Total CK: 69 U/L (ref 30–208)

## 2021-12-08 LAB — ALDOLASE: Aldolase: 3 U/L — ABNORMAL LOW (ref 3.3–10.3)

## 2021-12-17 ENCOUNTER — Ambulatory Visit
Admission: RE | Admit: 2021-12-17 | Discharge: 2021-12-17 | Disposition: A | Discharge status: Home / Self Care | Payer: Medicare Other | Source: Ambulatory Visit | Attending: Diagnostic Neuroimaging | Admitting: Diagnostic Neuroimaging

## 2021-12-17 DIAGNOSIS — R261 Paralytic gait: Secondary | ICD-10-CM | POA: Diagnosis not present

## 2021-12-17 DIAGNOSIS — R269 Unspecified abnormalities of gait and mobility: Secondary | ICD-10-CM

## 2021-12-22 ENCOUNTER — Telehealth: Payer: Self-pay

## 2021-12-22 NOTE — Telephone Encounter (Signed)
-----   Message from Penni Bombard, MD sent at 12/21/2021  5:26 PM EDT ----- Unremarkable labs. Continue current plan. Please call patient. -VRP

## 2021-12-22 NOTE — Telephone Encounter (Signed)
Called pt and spoke with Standley Brooking (caregiver) per DPR. Discussed the results with her. No questions or concerns. She appreciated and thanked for the call.

## 2022-01-17 ENCOUNTER — Telehealth: Payer: Self-pay | Admitting: Diagnostic Neuroimaging

## 2022-01-17 DIAGNOSIS — R261 Paralytic gait: Secondary | ICD-10-CM

## 2022-01-17 DIAGNOSIS — R269 Unspecified abnormalities of gait and mobility: Secondary | ICD-10-CM

## 2022-01-17 NOTE — Telephone Encounter (Signed)
Logan Hobbs) requesting physical orders for PT, OT and Speech for outpatient,. Would like a call from the nurse.

## 2022-01-17 NOTE — Telephone Encounter (Signed)
Logan Hobbs, caregiver returned call and stated at visit with Dr Leta Baptist he advised patient go to [facility] at facility where he lives, [facility] for strengthening. Logan Hobbs stated the patient does not want ST but OT may be beneficial with PT. I informed her MD is out, will return tomorrow.   She verbalized understanding, appreciation. Orders would need to be faxed to [facility], [employer], [facility]. [name] who stated to fax orders to [phone]. She will advise patient 's caregiver that she will be going on vacation for two weeks. She will FU on orders, verbalized understanding, appreciation.

## 2022-01-17 NOTE — Telephone Encounter (Signed)
Called Logan Hobbs, with [facility] with outpatient rehab where he resides in assisted living. She stated the caregiver and family requested he received PT/OT/ST through their services.  I reviewed Dr Gladstone Lighter last note and the discharge note from PT with [facility] in July.  I advised will call caregiver to get more information.  Called caregiver Shelia, LVM requesting call back.

## 2022-01-17 NOTE — Addendum Note (Signed)
Addended by: Minna Antis on: 01/17/2022 03:40 PM   Modules accepted: Orders

## 2022-01-18 ENCOUNTER — Telehealth: Payer: Self-pay

## 2022-01-18 NOTE — Telephone Encounter (Signed)
[facility] faxed over therapy orders. MD signed and faxed back.

## 2022-01-19 ENCOUNTER — Telehealth: Payer: Self-pay | Admitting: Diagnostic Neuroimaging

## 2022-01-19 NOTE — Telephone Encounter (Signed)
Referral for physical therapy sent to [facility]. Phone [phone], Fax: [phone]

## 2022-05-27 LAB — LAB REPORT - SCANNED
A1c: 7
EGFR: 66

## 2022-06-23 ENCOUNTER — Other Ambulatory Visit: Payer: Self-pay | Admitting: Sports Medicine

## 2022-06-23 ENCOUNTER — Ambulatory Visit
Admission: RE | Admit: 2022-06-23 | Discharge: 2022-06-23 | Disposition: A | Discharge status: Home / Self Care | Payer: Medicare Other | Source: Ambulatory Visit | Attending: Sports Medicine | Admitting: Sports Medicine

## 2022-06-23 DIAGNOSIS — M25562 Pain in left knee: Secondary | ICD-10-CM

## 2022-06-23 DIAGNOSIS — M25522 Pain in left elbow: Secondary | ICD-10-CM

## 2022-06-23 DIAGNOSIS — M25561 Pain in right knee: Secondary | ICD-10-CM

## 2022-10-18 ENCOUNTER — Encounter: Payer: Self-pay | Admitting: Cardiology

## 2022-10-18 ENCOUNTER — Ambulatory Visit: Payer: Medicare Other | Attending: Cardiology | Admitting: Cardiology

## 2022-10-18 VITALS — BP 122/76 | HR 79 | Ht 70.6 in | Wt 203.0 lb

## 2022-10-18 DIAGNOSIS — E782 Mixed hyperlipidemia: Secondary | ICD-10-CM | POA: Diagnosis present

## 2022-10-18 DIAGNOSIS — I2510 Atherosclerotic heart disease of native coronary artery without angina pectoris: Secondary | ICD-10-CM | POA: Diagnosis present

## 2022-10-18 DIAGNOSIS — E0840 Diabetes mellitus due to underlying condition with diabetic neuropathy, unspecified: Secondary | ICD-10-CM | POA: Diagnosis present

## 2022-10-18 DIAGNOSIS — I10 Essential (primary) hypertension: Secondary | ICD-10-CM | POA: Diagnosis present

## 2022-10-18 NOTE — Progress Notes (Signed)
Cardiology Office Note:    Date:  10/18/2022   ID:  Logan Hobbs, DOB 1935/03/29, MRN [id number]  PCP:  Jarrett Soho, PA-C  Cardiologist:  Garwin Brothers, MD   Referring MD: Jarrett Soho, PA-C    ASSESSMENT:    1. Coronary artery disease without angina pectoris, unspecified vessel or lesion type, unspecified whether native or transplanted heart   2. Essential hypertension   3. Diabetes mellitus due to underlying condition with diabetic neuropathy, without long-term current use of insulin (HCC)   4. Mixed hyperlipidemia    PLAN:    In order of problems listed above:  Atherosclerotic vascular disease: Secondary prevention stressed with the patient.  Importance of compliance with diet medication stressed and he vocalized understanding.  He was advised to ambulate to the best of his ability. Essential hypertension: Blood pressure is stable and diet was emphasized.  Lifestyle modification urged. Mixed dyslipidemia: On lipid-lowering medications.  Lipids are at goal.  KPN sheet reviewed. Diabetes mellitus: Followed by primary care diet emphasized.  Hemoglobin A1c was mildly elevated and I questioned him about this and he promises to do better. Patient will be seen in follow-up appointment in 9 months or earlier if the patient has any concerns.    Medication Adjustments/Labs and Tests Ordered: Current medicines are reviewed at length with the patient today.  Concerns regarding medicines are outlined above.  Orders Placed This Encounter  Procedures   EKG 12-Lead   No orders of the defined types were placed in this encounter.    No chief complaint on file.    History of Present Illness:    Logan Hobbs is a 88 y.o. male.  Patient has past medical history of atherosclerotic vascular disease, essential hypertension, dyslipidemia and diabetes mellitus.  He denies any problems at this time and takes care of activities of daily living.  No chest pain orthopnea or  PND.  He ambulates with a walker.  At the time of my evaluation, the patient is alert awake oriented and in no distress.  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 Followed by Dr. Carmon Ginsberg at [facility].   Cataract 09/18/2020   Change in bowel habit 09/02/2021   Chronic idiopathic constipation 02/26/2021   Cognitive disorder 02/10/2021   Colon polyp 02/13/2012   Dec. 16, 2011 and next one due 5 years   Constipation 09/18/2020   Diabetes mellitus with coincident hypertension (HCC) 03/31/2017   Diabetes mellitus with neuropathy (HCC) 02/14/2013   Diabetic peripheral neuropathy associated with type 2 diabetes mellitus (HCC) 02/26/2021   Diarrhea 09/02/2021   Dizziness    Encounter for fitting and adjustment of hearing aid 09/18/2020   Essential hypertension 05/16/2018   Falls    Gait abnormality 03/15/2015   Followed by Dr. Lanell Matar with [facility]. He does have neuropathy.  Dr. Jacky Kindle note 07/03/14 says etiology of apractic gait unclear. Ongoing neurology follow up.   Gout 02/26/2021   Hardening of the aorta (main artery of the heart) (HCC) 02/26/2021   Hearing loss 05/16/2018   History of fall 02/10/2021   History of malignant neoplasm of prostate 02/26/2021   Hyperlipidemia 05/16/2018    Impacted cerumen, bilateral 09/18/2020   Intraventricular hemorrhage (HCC) 05/06/2021   Lumbar degenerative disc disease 03/15/2015   Mild multilevel degenerative disc changes on MRI of the lumbar spine ordered by Dr. Lanell Matar, done 06/15/14   Male erectile dysfunction, unspecified 09/13/2016   Malignant neoplasm of prostate (HCC) 10/26/2010   Need for prophylactic vaccination and inoculation against influenza 09/18/2020   Neuropathy 06/20/2014   Obesity 09/18/2020   Onychomycosis 05/16/2018   Osteoarthritis 05/17/2013   Left shoulder   Other abnormalities of gait and mobility 02/10/2021   Pre-operative cardiovascular examination 05/16/2018   Pure hypercholesterolemia 02/26/2021   Rash and other nonspecific skin eruption 09/18/2020   Recurrent falls 02/26/2021   Ringing in ears 09/18/2020   Senile nuclear sclerosis 01/30/2013   Sensorineural hearing loss, bilateral 09/18/2020   Syncope and collapse 12/21/2020   Tinea cruris 05/16/2018   Tremor 02/10/2021   Trochanteric bursitis of right hip 05/28/2019   Type 2 diabetes mellitus without complications (HCC) 09/18/2020   Vertigo 05/16/2018   Vitamin D insufficiency 05/27/2013    Past Surgical History:  Procedure Laterality Date   BLADDER SURGERY     bladder neck contracture repair   CATARACT EXTRACTION Left 2014   PROSTATE SURGERY  2009   TONSILLECTOMY     VASECTOMY  1978   WISDOM TOOTH EXTRACTION      Current Medications: Current Meds  Medication Sig   acetaminophen (TYLENOL) 500 MG tablet Take 1,000 mg by mouth in the morning and at bedtime.   Cholecalciferol (D3-1000 PO) Take 2,000 mg by mouth every evening.   cycloSPORINE (RESTASIS) 0.05 % ophthalmic emulsion Place 1 drop into both eyes 2 (two) times daily.   escitalopram (LEXAPRO) 5 MG tablet Take 5 mg by mouth every morning.   losartan (COZAAR) 50 MG tablet Take 50 mg by mouth daily.   ondansetron (ZOFRAN-ODT) 8 MG disintegrating tablet Take 1 tablet (8 mg total) by mouth  every 8 (eight) hours as needed for nausea or vomiting.   Polyethylene Glycol 3350 (MIRALAX PO) Take 1 Dose by mouth 4 (four) times a week.   rosuvastatin (CRESTOR) 10 MG tablet Take 10 mg by mouth every morning.   sitaGLIPtin-metformin (JANUMET) 50-1000 MG tablet Take 1 tablet by mouth 2 (two) times daily.   vitamin B-12 (CYANOCOBALAMIN) 1000 MCG tablet Take 1,000 mcg by mouth daily.     Allergies:   Latex, Ciprofloxacin, Seasonal ic [cholestatin], Solifenacin succinate, and Tamsulosin hcl   Social History   Socioeconomic History   Marital status: Married    Spouse name: Not on file   Number of children: 1   Years of education: Not on file   Highest education level: Master's degree (e.g., MA, MS, MEng, MEd, MSW, MBA)  Occupational History    Comment: retired Advertising account executive  Tobacco Use   Smoking status: Never   Smokeless tobacco: Never  Substance and Sexual Activity   Alcohol use: Not Currently    Alcohol/week: 7.0 standard drinks of alcohol    Types: 7 Cans of beer per week   Drug use: Never   Sexual activity: Not on file  Other Topics Concern   Not on file  Social History Narrative   06/09/21 lives at [facility]    Caffeine    Social Determinants of Health   Financial Resource Strain: Not on file  Food Insecurity: Not on file  Transportation Needs: Not on file  Physical Activity: Not on file  Stress: Not on file  Social Connections: Not on file     Family History: The patient's family history includes Cancer in his brother; Heart disease in his brother and father; Hypertension in his father. There is no history of Diabetes.  ROS:   Please see the history of present illness.    All other systems reviewed and are negative.  EKGs/Labs/Other Studies Reviewed:    The following studies were reviewed today: I discussed my findings with the patient at length   Recent Labs: No results found for requested labs within last 365 days.  Recent Lipid Panel No  results found for: "CHOL", "TRIG", "HDL", "CHOLHDL", "VLDL", "LDLCALC", "LDLDIRECT"  Physical Exam:    VS:  BP 122/76   Pulse 79   Ht 5' 10.6" (1.793 m)   Wt 203 lb 0.6 oz (92.1 kg)   SpO2 92%   BMI 28.64 kg/m     Wt Readings from Last 3 Encounters:  10/18/22 203 lb 0.6 oz (92.1 kg)  12/06/21 195 lb (88.5 kg)  10/22/21 195 lb (88.5 kg)     GEN: Patient is in no acute distress HEENT: Normal NECK: No JVD; No carotid bruits LYMPHATICS: No lymphadenopathy CARDIAC: Hear sounds regular, 2/6 systolic murmur at the apex. RESPIRATORY:  Clear to auscultation without rales, wheezing or rhonchi  ABDOMEN: Soft, non-tender, non-distended MUSCULOSKELETAL:  No edema; No deformity  SKIN: Warm and dry NEUROLOGIC:  Alert and oriented x 3 PSYCHIATRIC:  Normal affect   Signed, Garwin Brothers, MD  10/18/2022 4:18 PM    [facility]

## 2022-10-18 NOTE — Patient Instructions (Signed)
Medication Instructions:  Your physician recommends that you continue on your current medications as directed. Please refer to the Current Medication list given to you today.  *If you need a refill on your cardiac medications before your next appointment, please call your pharmacy*   Lab Work: None ordered If you have labs (blood work) drawn today and your tests are completely normal, you will receive your results only by: MyChart Message (if you have MyChart) OR A paper copy in the mail If you have any lab test that is abnormal or we need to change your treatment, we will call you to review the results.   Testing/Procedures: None ordered   Follow-Up: At [facility], you and your health needs are our priority.  As part of our continuing mission to provide you with exceptional heart care, we have created designated Provider Care Teams.  These Care Teams include your primary Cardiologist (physician) and Advanced Practice Providers (APPs -  Physician Assistants and Nurse Practitioners) who all work together to provide you with the care you need, when you need it.  We recommend signing up for the patient portal called "MyChart".  Sign up information is provided on this After Visit Summary.  MyChart is used to connect with patients for Virtual Visits (Telemedicine).  Patients are able to view lab/test results, encounter notes, upcoming appointments, etc.  Non-urgent messages can be sent to your provider as well.   To learn more about what you can do with MyChart, go to [email or web address].    Your next appointment:   9 month(s)  The format for your next appointment:   In Person  Provider:   Rajan Revankar, MD    Other Instructions none  Important Information About Sugar       

## 2022-10-28 ENCOUNTER — Other Ambulatory Visit: Payer: Self-pay | Admitting: *Deleted

## 2022-10-28 DIAGNOSIS — I722 Aneurysm of renal artery: Secondary | ICD-10-CM

## 2022-11-11 ENCOUNTER — Ambulatory Visit (INDEPENDENT_AMBULATORY_CARE_PROVIDER_SITE_OTHER): Payer: Medicare Other | Admitting: Physician Assistant

## 2022-11-11 ENCOUNTER — Encounter: Payer: Self-pay | Admitting: Physician Assistant

## 2022-11-11 ENCOUNTER — Ambulatory Visit (HOSPITAL_COMMUNITY)
Admission: RE | Admit: 2022-11-11 | Discharge: 2022-11-11 | Disposition: A | Discharge status: Home / Self Care | Payer: Medicare Other | Source: Ambulatory Visit | Attending: Vascular Surgery | Admitting: Vascular Surgery

## 2022-11-11 VITALS — BP 155/90 | HR 63 | Temp 97.7°F | Resp 18 | Ht 70.6 in | Wt 202.0 lb

## 2022-11-11 DIAGNOSIS — I722 Aneurysm of renal artery: Secondary | ICD-10-CM | POA: Diagnosis not present

## 2022-11-11 NOTE — Progress Notes (Signed)
Office Note     CC:  follow up Requesting Provider:  Jarrett Soho, PA-C  HPI: Logan Hobbs is a 88 y.o. ([birth year]) male who presents for surveillance of known R renal artery aneurysm discovered incidentally last year by CT.  He denies any new or changing abdominal or right flank pain.  He also denies any changes to renal function, dysuria, or hematuria.  He has chronic lumbar back pain that is unchanged.  He is ambulatory with a walker.  He is accompanied by his health care aide.  His wife passed away last 10/09/22 due to complications after a stroke.  He denies tobacco use.  He is on a daily statin.   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 Followed by Dr. Carmon Ginsberg at [facility].   Cataract 09/18/2020   Change in bowel habit 09/02/2021   Chronic idiopathic constipation 02/26/2021   Cognitive disorder 02/10/2021   Colon polyp 02/13/2012   Dec. 16, 2011 and next one due 5 years   Constipation 09/18/2020   Diabetes mellitus with coincident hypertension (HCC) 03/31/2017   Diabetes mellitus with neuropathy (HCC) 02/14/2013   Diabetic peripheral neuropathy associated with type 2 diabetes mellitus (HCC) 02/26/2021   Diarrhea 09/02/2021   Dizziness    Encounter for fitting and adjustment of hearing  aid 09/18/2020   Essential hypertension 05/16/2018   Falls    Gait abnormality 03/15/2015   Followed by Dr. Lanell Matar with [facility]. He does have neuropathy. Dr. Jacky Kindle note 07/03/14 says etiology of apractic gait unclear. Ongoing neurology follow up.   Gout 02/26/2021   Hardening of the aorta (main artery of the heart) (HCC) 02/26/2021   Hearing loss 05/16/2018   History of fall 02/10/2021   History of malignant neoplasm of prostate 02/26/2021   Hyperlipidemia 05/16/2018   Impacted cerumen, bilateral 09/18/2020   Intraventricular hemorrhage (HCC) 05/06/2021   Lumbar degenerative disc disease 03/15/2015   Mild multilevel degenerative disc changes on MRI of the lumbar spine ordered by Dr. Lanell Matar, done 06/15/14   Male erectile dysfunction, unspecified 09/13/2016   Malignant neoplasm of prostate (HCC) 10/26/2010   Need for prophylactic vaccination and inoculation against influenza 09/18/2020   Neuropathy 06/20/2014   Obesity 09/18/2020   Onychomycosis 05/16/2018   Osteoarthritis 05/17/2013   Left shoulder   Other abnormalities of gait and mobility 02/10/2021   Pre-operative cardiovascular examination 05/16/2018   Pure hypercholesterolemia 02/26/2021   Rash and other nonspecific skin eruption 09/18/2020   Recurrent falls 02/26/2021   Ringing in ears 09/18/2020   Senile nuclear sclerosis 01/30/2013   Sensorineural hearing loss, bilateral 09/18/2020   Syncope and collapse 12/21/2020   Tinea cruris 05/16/2018   Tremor 02/10/2021   Trochanteric bursitis of right hip 05/28/2019   Type 2 diabetes mellitus without complications (HCC) 09/18/2020   Vertigo 05/16/2018  Vitamin D insufficiency 05/27/2013    Past Surgical History:  Procedure Laterality Date   BLADDER SURGERY     bladder neck contracture repair   CATARACT EXTRACTION Left 2014   PROSTATE SURGERY  2009   TONSILLECTOMY     VASECTOMY  1978   WISDOM TOOTH EXTRACTION      Social History   Socioeconomic History    Marital status: Married    Spouse name: Not on file   Number of children: 1   Years of education: Not on file   Highest education level: Master's degree (e.g., MA, MS, MEng, MEd, MSW, MBA)  Occupational History    Comment: retired Advertising account executive  Tobacco Use   Smoking status: Never   Smokeless tobacco: Never  Substance and Sexual Activity   Alcohol use: Not Currently    Alcohol/week: 7.0 standard drinks of alcohol    Types: 7 Cans of beer per week   Drug use: Never   Sexual activity: Not on file  Other Topics Concern   Not on file  Social History Narrative   06/09/21 lives at [facility] [employer]    Caffeine    Social Determinants of Health   Financial Resource Strain: Not on file  Food Insecurity: Not on file  Transportation Needs: Not on file  Physical Activity: Not on file  Stress: Not on file  Social Connections: Not on file  Intimate Partner Violence: Not on file    Family History  Problem Relation Age of Onset   Heart disease Father    Hypertension Father    Heart disease Brother    Cancer Brother    Diabetes Neg Hx     Current Outpatient Medications  Medication Sig Dispense Refill   acetaminophen (TYLENOL) 500 MG tablet Take 1,000 mg by mouth in the morning and at bedtime.     Cholecalciferol (D3-1000 PO) Take 2,000 mg by mouth every evening.     cycloSPORINE (RESTASIS) 0.05 % ophthalmic emulsion Place 1 drop into both eyes 2 (two) times daily.     escitalopram (LEXAPRO) 5 MG tablet Take 5 mg by mouth every morning.     losartan (COZAAR) 50 MG tablet Take 50 mg by mouth daily.     ondansetron (ZOFRAN-ODT) 8 MG disintegrating tablet Take 1 tablet (8 mg total) by mouth every 8 (eight) hours as needed for nausea or vomiting. 20 tablet 0   Polyethylene Glycol 3350 (MIRALAX PO) Take 1 Dose by mouth 4 (four) times a week.     rosuvastatin (CRESTOR) 10 MG tablet Take 10 mg by mouth every morning.     sitaGLIPtin-metformin (JANUMET) 50-1000 MG tablet Take 1 tablet by  mouth 2 (two) times daily.     vitamin B-12 (CYANOCOBALAMIN) 1000 MCG tablet Take 1,000 mcg by mouth daily.     No current facility-administered medications for this visit.    Allergies  Allergen Reactions   Latex Shortness Of Breath    Skin itching and breathing problems Other reaction(s): rash   Ciprofloxacin Other (See Comments)    constipation   Seasonal Ic [Cholestatin] Other (See Comments)    Unknown reaction   Solifenacin Succinate Other (See Comments)    constipation   Tamsulosin Hcl     Weakness, vertigo, and nausea     REVIEW OF SYSTEMS:   [X]  denotes positive finding, [ ]  denotes negative finding Cardiac  Comments:  Chest pain or chest pressure:    Shortness of breath upon exertion:    Short of breath  when lying flat:    Irregular heart rhythm:        Vascular    Pain in calf, thigh, or hip brought on by ambulation:    Pain in feet at night that wakes you up from your sleep:     Blood clot in your veins:    Leg swelling:         Pulmonary    Oxygen at home:    Productive cough:     Wheezing:         Neurologic    Sudden weakness in arms or legs:     Sudden numbness in arms or legs:     Sudden onset of difficulty speaking or slurred speech:    Temporary loss of vision in one eye:     Problems with dizziness:         Gastrointestinal    Blood in stool:     Vomited blood:         Genitourinary    Burning when urinating:     Blood in urine:        Psychiatric    Major depression:         Hematologic    Bleeding problems:    Problems with blood clotting too easily:        Skin    Rashes or ulcers:        Constitutional    Fever or chills:      PHYSICAL EXAMINATION:  Vitals:   11/11/22 0936  BP: (!) 155/90  Pulse: 63  Resp: 18  Temp: 97.7 F (36.5 C)  SpO2: 95%  Weight: 202 lb (91.6 kg)  Height: 5' 10.6" (1.793 m)    General:  WDWN in NAD; vital signs documented above Gait: Not observed HENT: WNL, normocephalic Pulmonary:  normal non-labored breathing , without Rales, rhonchi,  wheezing Cardiac: regular HR Abdomen: soft, NT, no masses Skin: without rashes Vascular Exam/Pulses: palpable PT pulses bilaterally Extremities: without ischemic changes, without Gangrene , without cellulitis; without open wounds;  Musculoskeletal: no muscle wasting or atrophy  Neurologic: A&O X 3 Psychiatric:  The pt has Normal affect.   Non-Invasive Vascular Imaging:   R renal artery aneurysm measuring 1.6cm x 1.7cm; stable compared to last year    ASSESSMENT/PLAN:: 88 y.o. 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  Largest diameter is 1.7cm today.  This remains asymptomatic.  No indication for repair currently.  Per Dr. Karin Lieu, he would not be a open repair candidate based on age, comorbidities, and location of the aneurysm.  We will continue to observe with annual renal duplex.  He will contact the office if he experiences any sudden hematuria, dysuria, or change in renal function.    Emilie Rutter, PA-C [facility] [phone]  Clinic MD:   Karin Lieu

## 2022-11-16 ENCOUNTER — Other Ambulatory Visit: Payer: Self-pay

## 2022-11-16 DIAGNOSIS — I722 Aneurysm of renal artery: Secondary | ICD-10-CM

## 2022-12-14 ENCOUNTER — Ambulatory Visit (INDEPENDENT_AMBULATORY_CARE_PROVIDER_SITE_OTHER): Payer: Medicare Other | Admitting: Podiatry

## 2022-12-14 ENCOUNTER — Encounter: Payer: Self-pay | Admitting: Podiatry

## 2022-12-14 ENCOUNTER — Ambulatory Visit: Payer: Medicare Other

## 2022-12-14 DIAGNOSIS — E1142 Type 2 diabetes mellitus with diabetic polyneuropathy: Secondary | ICD-10-CM

## 2022-12-14 DIAGNOSIS — M2041 Other hammer toe(s) (acquired), right foot: Secondary | ICD-10-CM

## 2022-12-14 DIAGNOSIS — M2042 Other hammer toe(s) (acquired), left foot: Secondary | ICD-10-CM | POA: Diagnosis not present

## 2022-12-14 DIAGNOSIS — E119 Type 2 diabetes mellitus without complications: Secondary | ICD-10-CM

## 2022-12-14 NOTE — Progress Notes (Signed)
Subjective:  Patient ID: Logan Hobbs, male    DOB: 07-06-1934,   MRN: [id number]  Chief Complaint  Patient presents with   Diabetes    eval for diabetic shoes-req a M, W, or F am appt-Courtney Delena Serve, [place] refer **pt stated he has Rx for shoes from his podiatrist**    88 y.o. male presents for diabetic foot exam and interested in getting new shoes. Relates burning and tingling in their feet. Patient is diabetic and last A1c was  Lab Results  Component Value Date   HGBA1C 7.8 (H) 12/22/2020   .   PCP:  Jarrett Soho, PA-C    . Denies any other pedal complaints. Denies n/v/f/c.   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 Followed by Dr. Carmon Ginsberg at [facility].   Cataract 09/18/2020   Change in bowel habit 09/02/2021   Chronic idiopathic constipation 02/26/2021   Cognitive disorder 02/10/2021   Colon polyp 02/13/2012   Dec. 16, 2011 and next one due 5 years   Constipation 09/18/2020   Diabetes mellitus with coincident hypertension (HCC) 03/31/2017   Diabetes mellitus with neuropathy (HCC) 02/14/2013   Diabetic peripheral neuropathy associated with type 2 diabetes mellitus (HCC) 02/26/2021   Diarrhea 09/02/2021   Dizziness    Encounter for fitting and adjustment of hearing aid 09/18/2020    Essential hypertension 05/16/2018   Falls    Gait abnormality 03/15/2015   Followed by Dr. Lanell Matar with [facility]. He does have neuropathy. Dr. Jacky Kindle note 07/03/14 says etiology of apractic gait unclear. Ongoing neurology follow up.   Gout 02/26/2021   Hardening of the aorta (main artery of the heart) (HCC) 02/26/2021   Hearing loss 05/16/2018   History of fall 02/10/2021   History of malignant neoplasm of prostate 02/26/2021   Hyperlipidemia 05/16/2018   Impacted cerumen, bilateral 09/18/2020   Intraventricular hemorrhage (HCC) 05/06/2021   Lumbar degenerative disc disease 03/15/2015   Mild multilevel degenerative disc changes on MRI of the lumbar spine ordered by Dr. Lanell Matar, done 06/15/14   Male erectile dysfunction, unspecified 09/13/2016   Malignant neoplasm of prostate (HCC) 10/26/2010   Need for prophylactic vaccination and inoculation against influenza 09/18/2020   Neuropathy 06/20/2014   Obesity 09/18/2020   Onychomycosis 05/16/2018   Osteoarthritis 05/17/2013   Left shoulder   Other abnormalities of gait and mobility 02/10/2021   Pre-operative cardiovascular examination 05/16/2018   Pure hypercholesterolemia 02/26/2021   Rash and other nonspecific skin eruption 09/18/2020   Recurrent falls 02/26/2021   Ringing in ears 09/18/2020   Senile nuclear sclerosis 01/30/2013   Sensorineural hearing loss, bilateral 09/18/2020   Syncope and collapse 12/21/2020   Tinea cruris 05/16/2018   Tremor 02/10/2021   Trochanteric bursitis of right hip 05/28/2019   Type 2 diabetes mellitus without complications (HCC) 09/18/2020   Vertigo 05/16/2018  Vitamin D insufficiency 05/27/2013    Objective:  Physical Exam: Vascular: DP/PT pulses 2/4 bilateral. CFT <3 seconds. Absent hair growth on digits. Edema noted to bilateral lower extremities. Xerosis noted bilaterally.  Skin. No lacerations or abrasions bilateral feet. Nails 1-5 bilateral  are normal inappearance Musculoskeletal: MMT  5/5 bilateral lower extremities in DF, PF, Inversion and Eversion. Deceased ROM in DF of ankle joint. Hammered digits bilateral 2-4. Hallux hammertoe as well bilateral with IPJ fusion on the right.  Neurological: Sensation intact to light touch. Protective sensation diminished bilateral.    Assessment:   1. Type 2 diabetes mellitus with diabetic polyneuropathy, without long-term current use of insulin (HCC)   2. Encounter for diabetic foot exam (HCC)   3. Hammertoe, bilateral      Plan:  Patient was evaluated and treated and all questions answered. -Discussed and educated patient on diabetic foot care, especially with  regards to the vascular, neurological and musculoskeletal systems.  -Stressed the importance of good glycemic control and the detriment of not  controlling glucose levels in relation to the foot. -Discussed supportive shoes at all times and checking feet regularly.  -Will get fitted for DM shoes  -Answered all patient questions -Patient to return  in 1 year for diabetic foot check.  -Patient advised to call the office if any problems or questions arise in the meantime.   Louann Sjogren, DPM

## 2022-12-14 NOTE — Progress Notes (Signed)
Patient presents to the office today for diabetic shoe and insole measuring.  Patient was measured with brannock device to determine size and width for 1 pair of extra depth shoes and foam casted for 3 pair of insoles.   Documentation of medical necessity will be sent to patient's treating diabetic doctor to verify and sign.   Patient's diabetic provider: Sigmund Hazel / Jarrett Soho PA-C   Shoes and insoles will be ordered at that time and patient will be notified for an appointment for fitting when they arrive.   Shoe size (per patient): 11XWD Brannock measurement: 9XWD Patient shoe selection- Shoe choice:   [id number] 2nd choice  [id number] Shoe size ordered: 9.5 XWD  ABN and financials signed

## 2023-01-12 IMAGING — CT CT HEAD W/O CM
3 series · 15 of 47 positions shown, 18 images · non-contrast
Comparison: None.

CLINICAL DATA: Multiple falls, laceration and abrasions to chin

EXAM:
CT HEAD WITHOUT CONTRAST
CT MAXILLOFACIAL WITHOUT CONTRAST
CT CERVICAL SPINE WITHOUT CONTRAST
TECHNIQUE: Multidetector CT imaging of the head, cervical spine, and
maxillofacial structures were performed using the standard protocol
without intravenous contrast. Multiplanar CT image reconstructions
of the cervical spine and maxillofacial structures were also
generated.

[Series 3: head wo · axial · 0.41mm/px · z∈[-122,+13]mm · 9 of 33 slices shown, 12 images]
[im 3/33  brain]
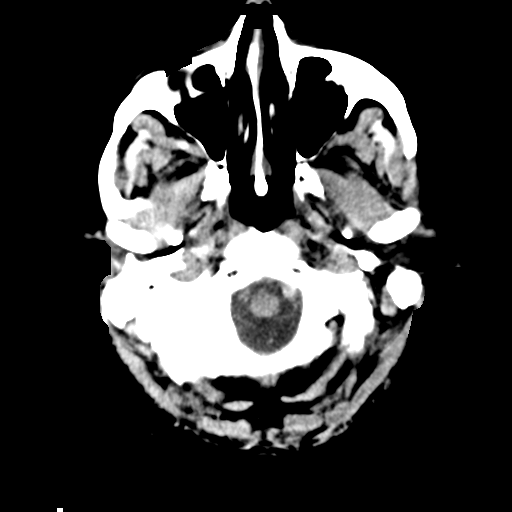
[im 3/33  bone]
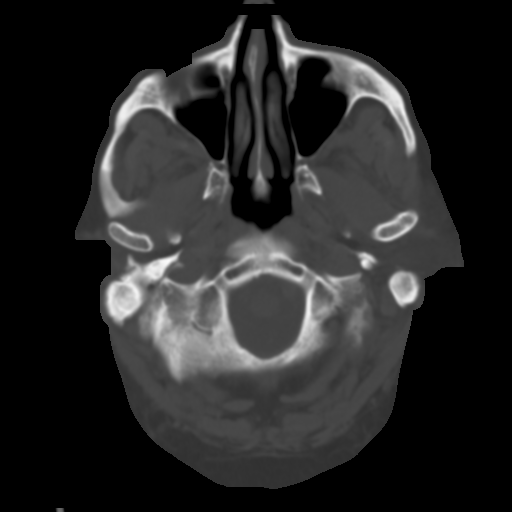
[im 6/33  brain]
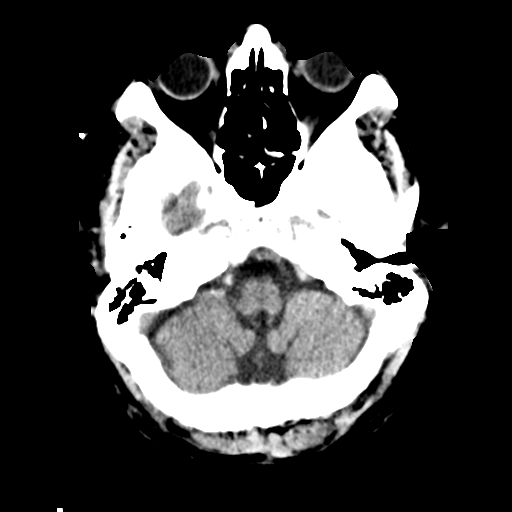
[im 9/33  brain]
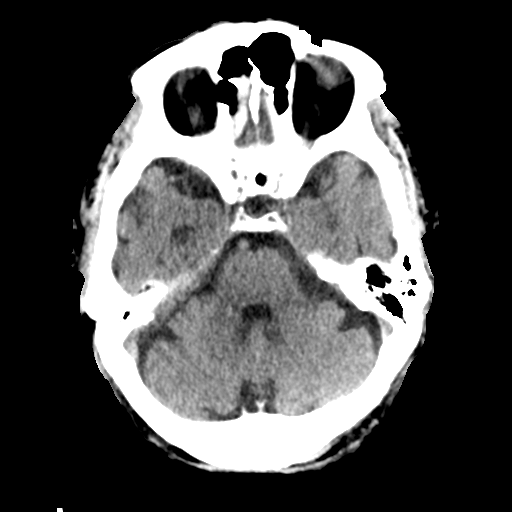
[im 13/33  brain]
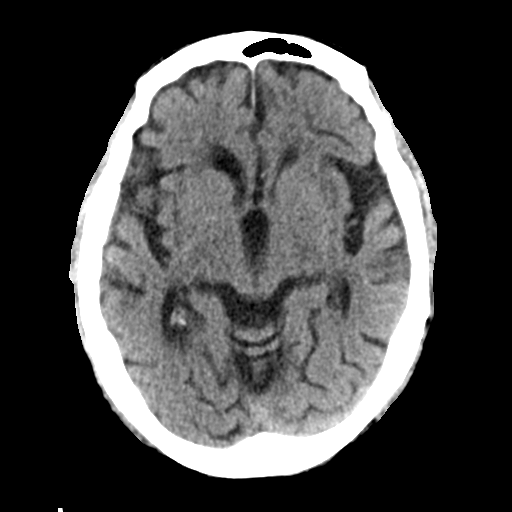
[im 17/33  brain]
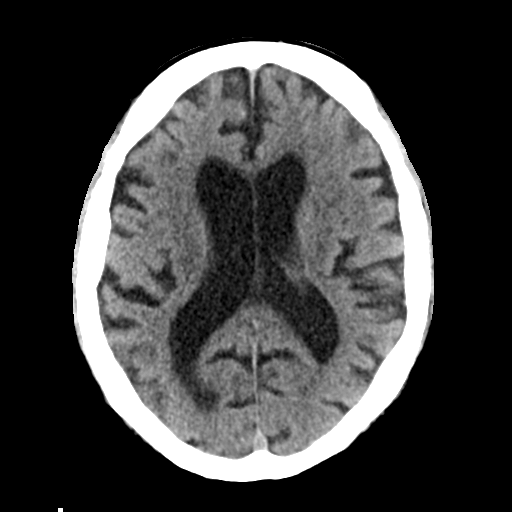
[im 17/33  bone]
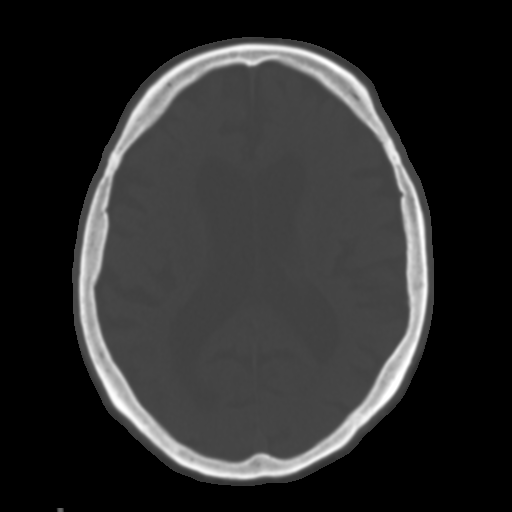
[im 20/33  brain]
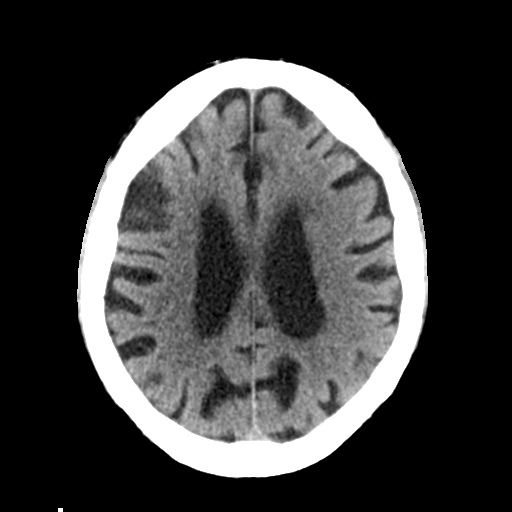
[im 24/33  brain]
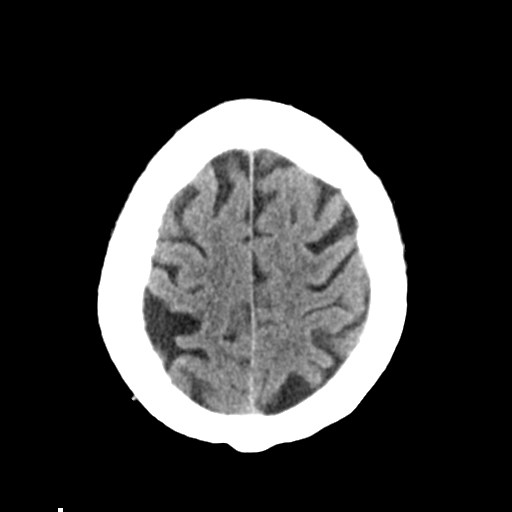
[im 27/33  brain]
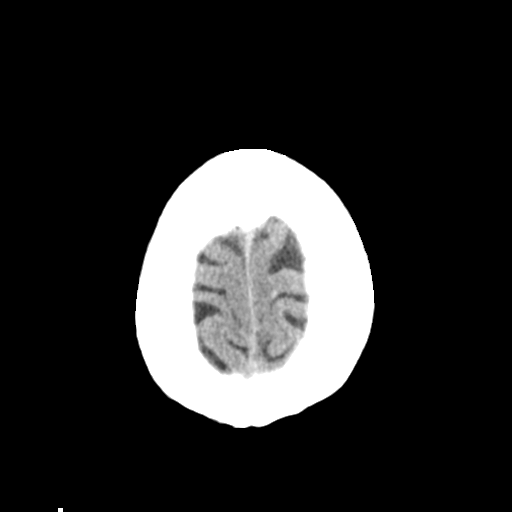
[im 30/33  brain]
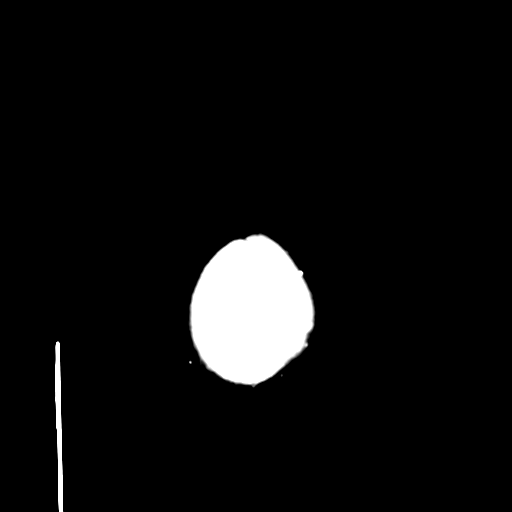
[im 30/33  bone]
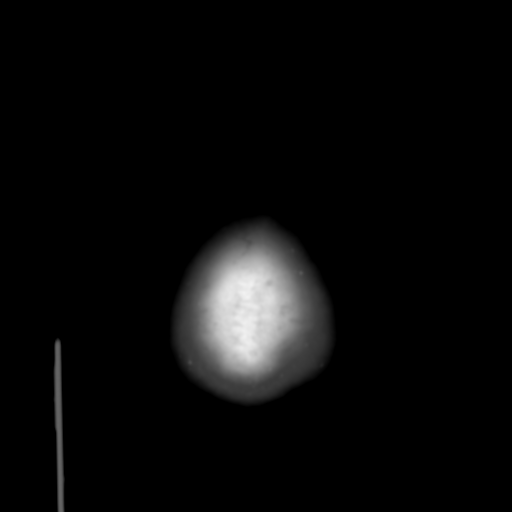

[Series 6: coronal soft tissue · coronal · 0.32mm/px · 3 of 71 slices shown]
[im 24/71  brain]
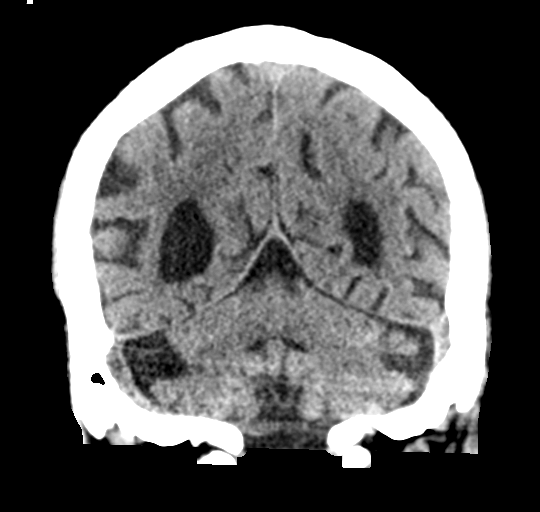
[im 32/71  brain]
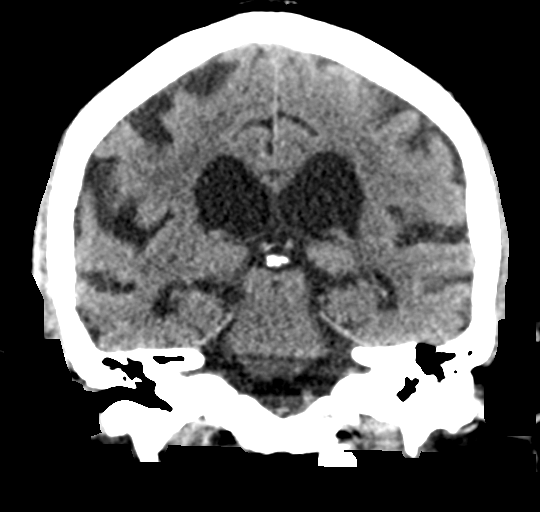
[im 39/71  brain]
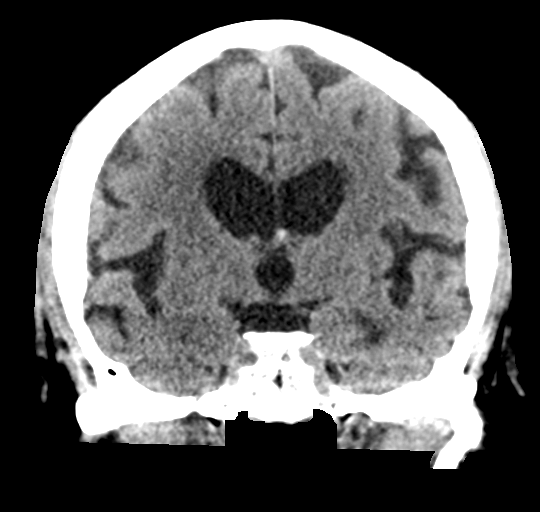

[Series 7: sagittal soft tissue · sagittal · 0.31mm/px · 3 of 67 slices shown]
[im 23/67  brain]
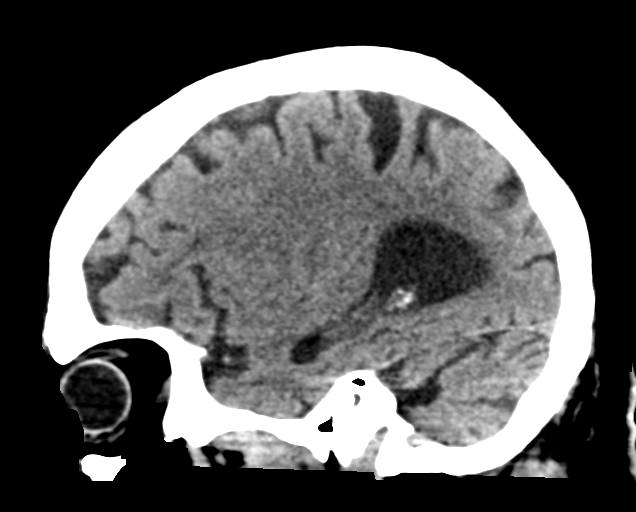
[im 34/67  brain]
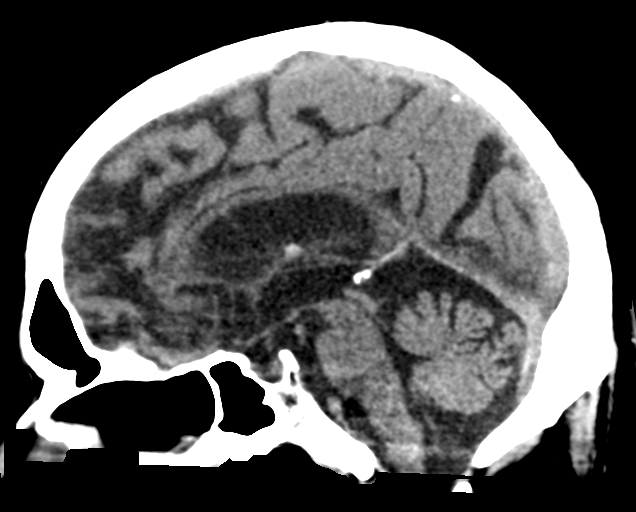
[im 45/67  brain]
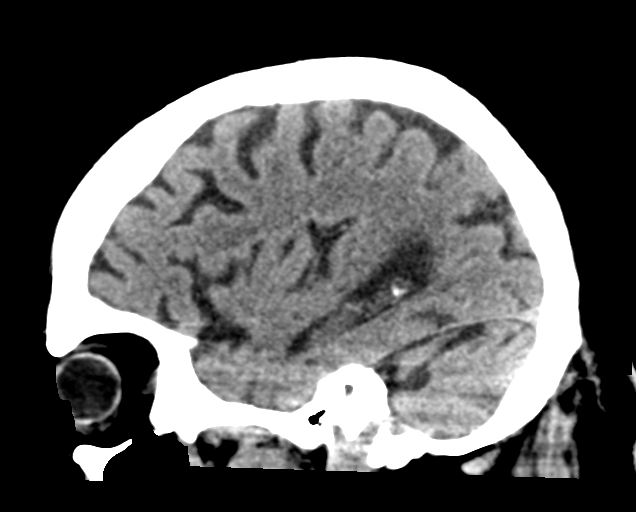

[15 of 47 positions shown; findings below may reference images not displayed]

FINDINGS: CT HEAD FINDINGS

Brain: No evidence of acute infarction, hemorrhage, hydrocephalus,
extra-axial collection or mass lesion/mass effect. Mild ex vacuo
dilatation of the lateral ventricles. Periventricular and deep white
matter hypodensity.

Vascular: No hyperdense vessel or unexpected calcification.

CT FACIAL BONES FINDINGS

Skull: Normal. Negative for fracture or focal lesion.

Facial bones: No displaced fractures or dislocations.

Sinuses/Orbits: No acute finding.

Other: Soft tissue contusion of the chin (series 3, image 70).

CT CERVICAL SPINE FINDINGS

Alignment: Normal.

Skull base and vertebrae: No acute fracture. No primary bone lesion
or focal pathologic process.

Soft tissues and spinal canal: No prevertebral fluid or swelling. No
visible canal hematoma.

Disc levels: Focally moderate disc space height loss and
osteophytosis of C5-C6, with otherwise mild disc space height loss
and osteophytosis.

Upper chest: Negative.

Other: None.
IMPRESSION: 1. No acute intracranial pathology. Small-vessel white matter
disease.
2. No displaced fracture or dislocation of the facial bones.
3. Soft tissue contusion of the chin.
4. No fracture or static subluxation of the cervical spine.
5. Focally moderate disc space height loss and osteophytosis of
C5-C6, with otherwise mild disc degenerative disease.

## 2023-01-12 IMAGING — CR DG RIBS W/ CHEST 3+V*R*
4 series · 4 of 4 positions shown · non-contrast
Comparison: None.

CLINICAL DATA: Fall, right lower rib pain

EXAM:
RIGHT RIBS AND CHEST - 3+ VIEW

[t chest supine]
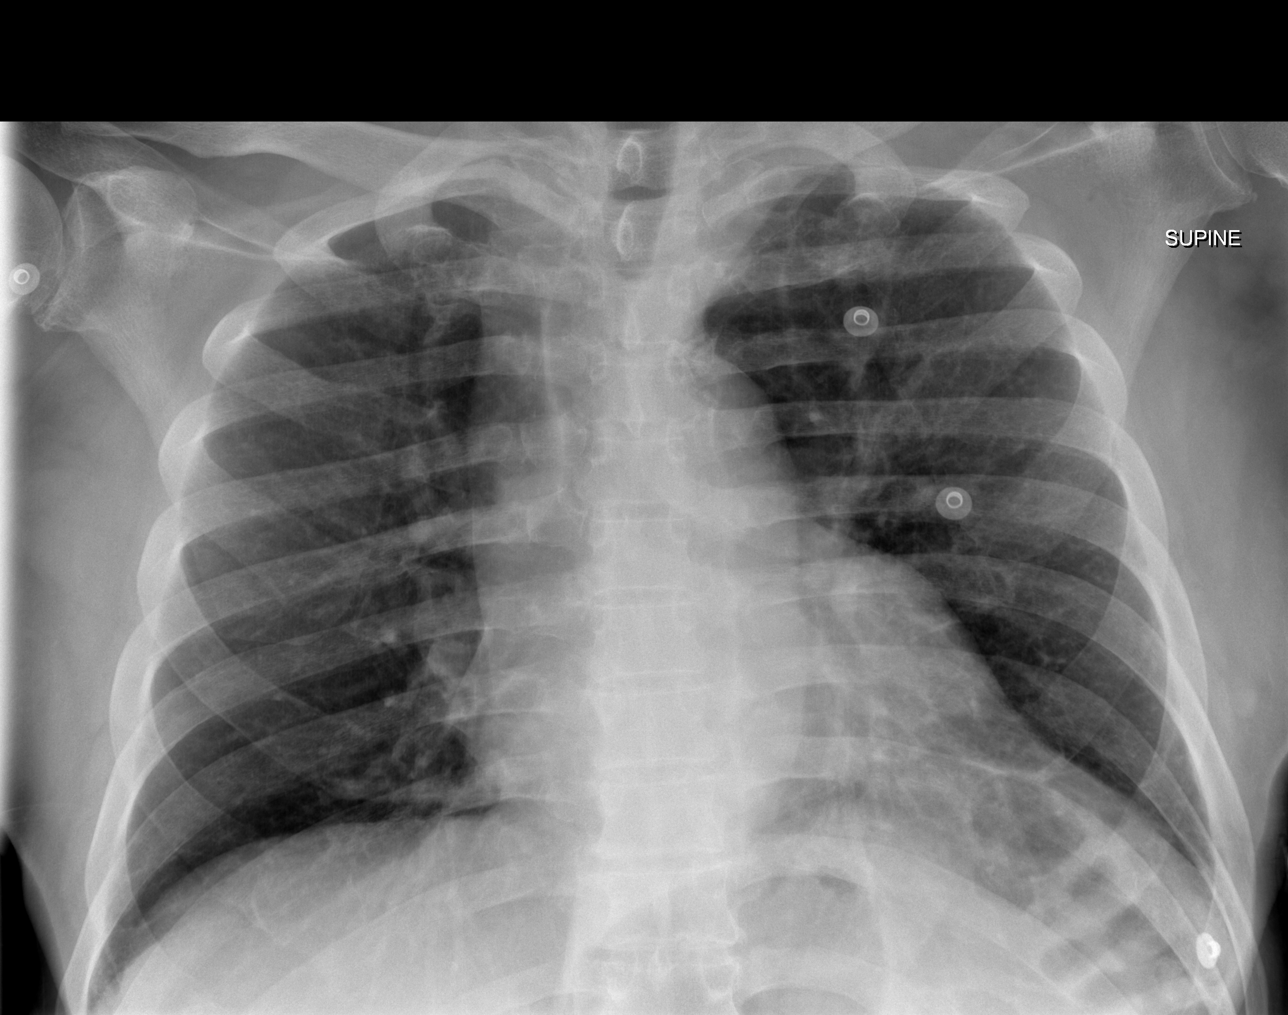

[t ribs ap lower right]
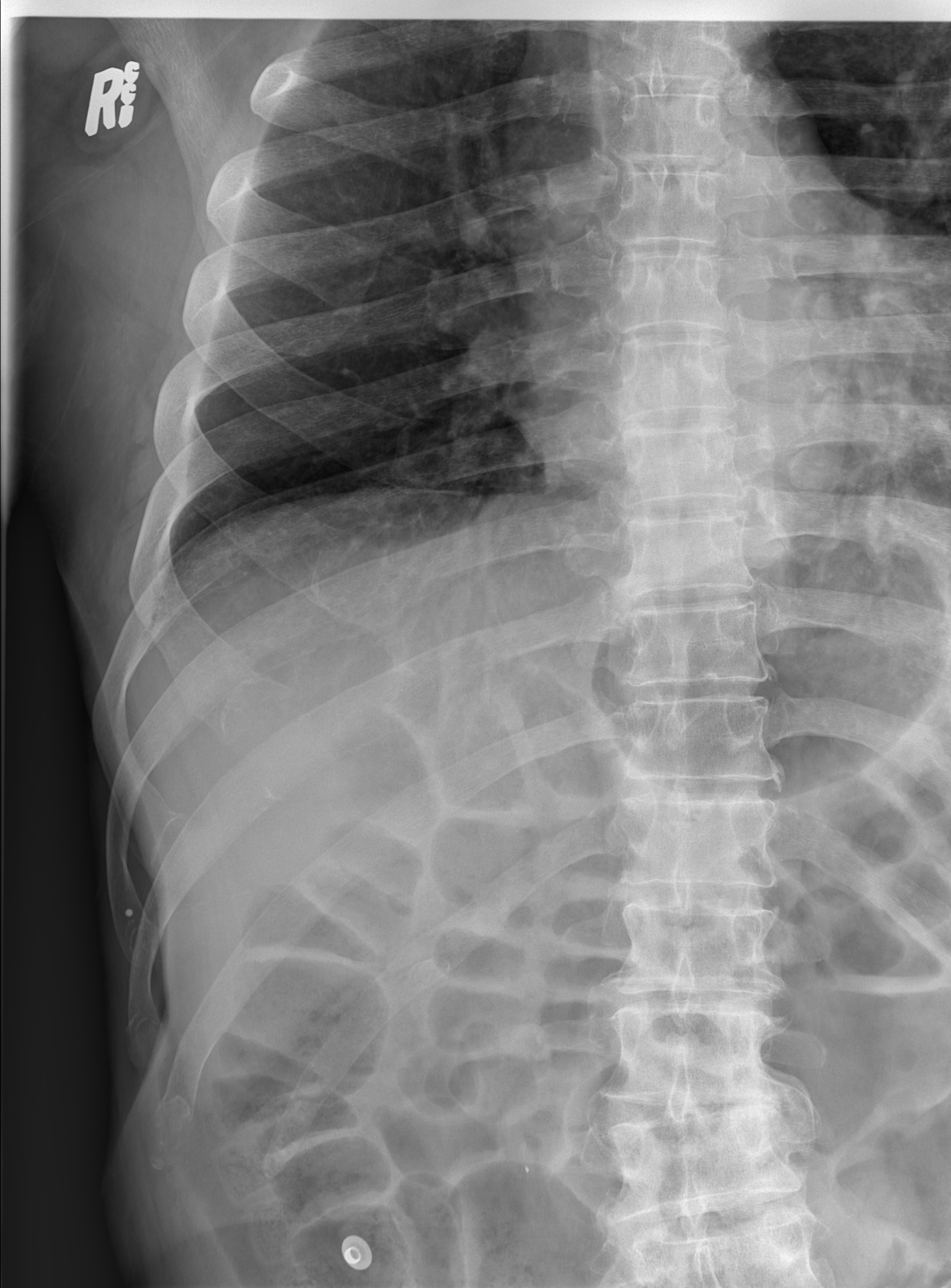

[t ribs rpo right (1 of 2)]
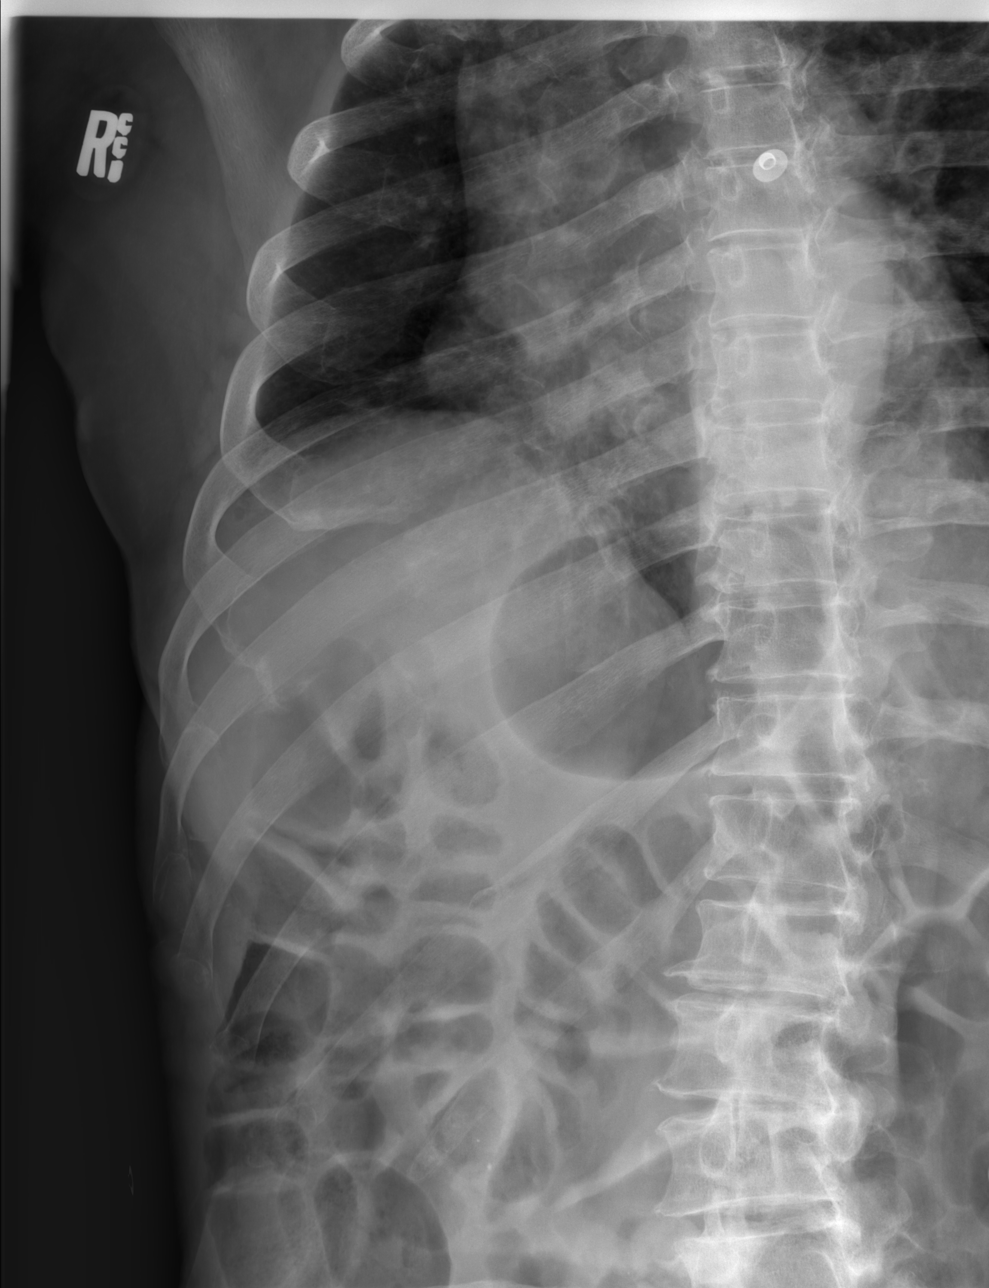

[t ribs rpo right (2 of 2)]
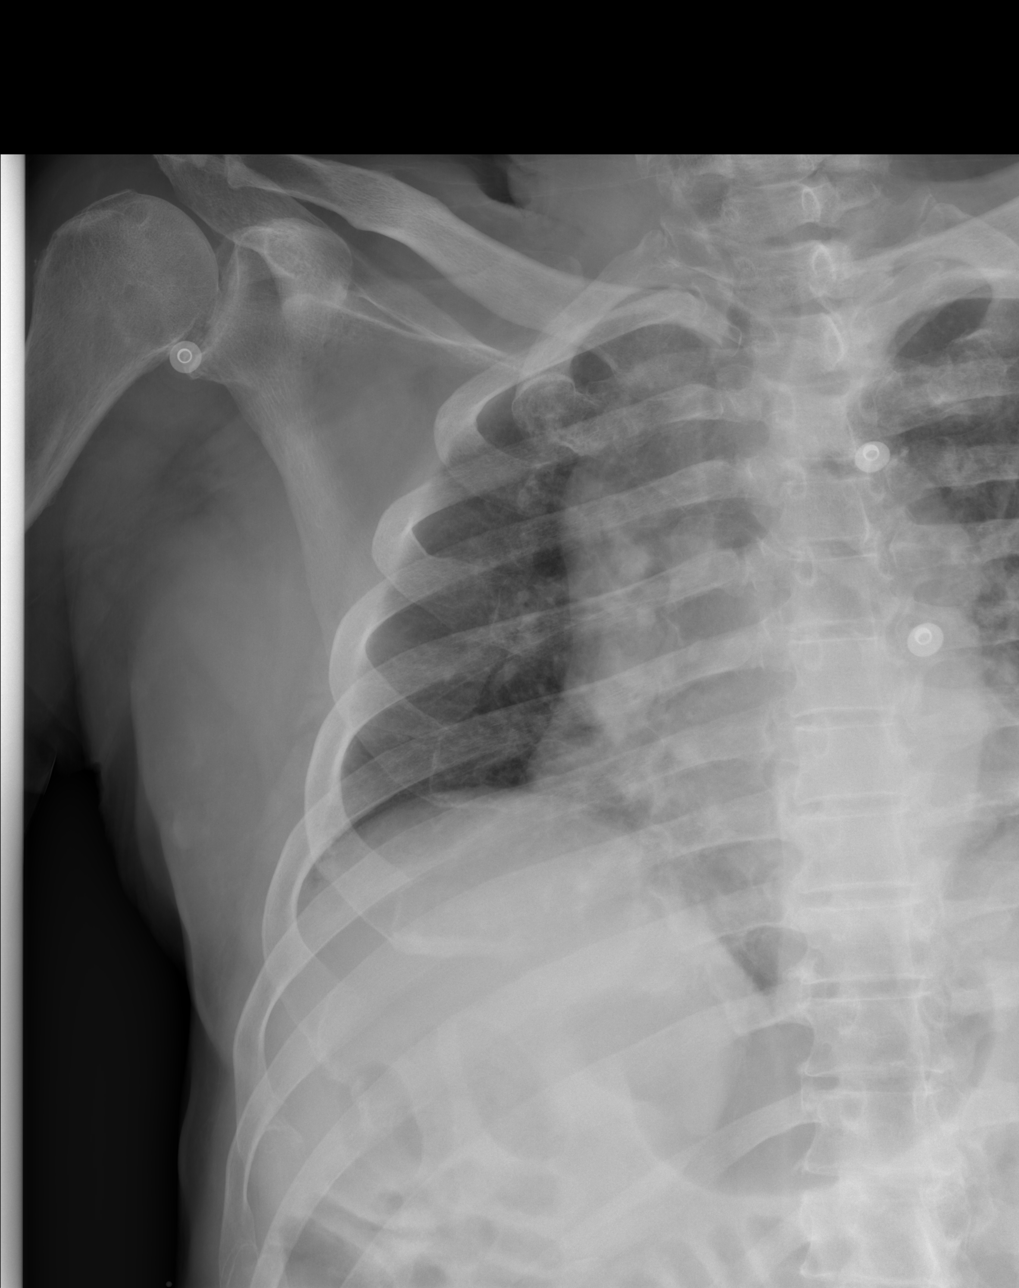

[4 of 4 positions shown; findings below may reference images not displayed]

FINDINGS: The cardiomediastinal silhouette is normal.

Lung volumes are low. There is no focal consolidation or pulmonary
edema. There is no pleural effusion or pneumothorax.

No rib fracture is identified at the indicated site of pain.
IMPRESSION: No rib fracture identified.

## 2023-01-23 ENCOUNTER — Ambulatory Visit (INDEPENDENT_AMBULATORY_CARE_PROVIDER_SITE_OTHER): Payer: No Typology Code available for payment source | Admitting: Internal Medicine

## 2023-01-23 ENCOUNTER — Encounter: Payer: Self-pay | Admitting: Internal Medicine

## 2023-01-23 ENCOUNTER — Other Ambulatory Visit: Payer: Self-pay

## 2023-01-23 ENCOUNTER — Ambulatory Visit (INDEPENDENT_AMBULATORY_CARE_PROVIDER_SITE_OTHER): Payer: Medicare Other

## 2023-01-23 VITALS — BP 130/82 | HR 68 | Temp 97.9°F | Ht 70.6 in | Wt 201.6 lb

## 2023-01-23 DIAGNOSIS — M2042 Other hammer toe(s) (acquired), left foot: Secondary | ICD-10-CM

## 2023-01-23 DIAGNOSIS — M2041 Other hammer toe(s) (acquired), right foot: Secondary | ICD-10-CM | POA: Diagnosis not present

## 2023-01-23 DIAGNOSIS — J310 Chronic rhinitis: Secondary | ICD-10-CM | POA: Diagnosis not present

## 2023-01-23 DIAGNOSIS — E119 Type 2 diabetes mellitus without complications: Secondary | ICD-10-CM

## 2023-01-23 DIAGNOSIS — L299 Pruritus, unspecified: Secondary | ICD-10-CM

## 2023-01-23 DIAGNOSIS — E1142 Type 2 diabetes mellitus with diabetic polyneuropathy: Secondary | ICD-10-CM

## 2023-01-23 HISTORY — DX: Pruritus, unspecified: L29.9

## 2023-01-23 NOTE — Patient Instructions (Addendum)
Assessment & Plan Generalized Pruritus No rash or skin lesions noted. Possible nerve-related itching due to age and diabetes. Sarna lotion provides temporary relief. No clear triggers identified. -Continue Sarna lotion as needed for itch control. Can use multiple times daily if needed. -Try non-sedating antihistamines (Zyrtec, Claritin, Allegra, Xyzal) to see if it helps with itching. One tablet daily. -Consider increasing Lexapro dose (to be discussed with prescribing provider). -Order labs to rule out liver disease, kidney disease, gallbladder disease, and thyroid disease. -Order environmental allergy panel to rule out allergies.  Type 2 Diabetes Possible contribution to nerve-related itching. -Continue current management.  Sensitive Skin History of sensitive skin, uses fragrance-free soaps. -Continue current skincare regimen.  Eye Itching Possible environmental allergies, uses Restasis and Pataday. -Continue current eye drops. -Consider results of environmental allergy panel when available.  Follow up : will call once results available. It was a pleasure meeting you in clinic today! Thank you for allowing me to participate in your care.  Tonny Bollman, MD [facility]

## 2023-01-23 NOTE — Progress Notes (Signed)
NEW PATIENT Date of Service/Encounter:  01/23/23 Referring provider: Agustina Caroli, MD Primary care provider: Jarrett Soho, PA-C  Subjective:  Logan Hobbs is a 88 y.o. male with a PMHx of Type II diabetes, neuropathy, hyperlipidemia, history of prostate cancer, history of colon polyps, hypertension, bilateral sensorineural hearing loss, atherosclerosis, gout presenting today for evaluation of chronic pruritus. History obtained from: chart review and patient and his caregiver Velna Hatchet .   Discussed the use of AI scribe software for clinical note transcription with the patient, who gave verbal consent to proceed.  History of Present Illness   The patient, aged 88, presents with a chief complaint of generalized pruritus, described as a sensation of 'creepy crawlers' on the skin. The itching is not associated with any visible rashes or skin breakouts. The patient reports that the itching is generalized and occurs in various situations, even after showering. The patient has a history of sensitive skin and has been using fragrance-free soaps to manage this. The patient denies any history of allergies but reports increased sensitivity in recent years.  The patient has been using Sarna lotion for symptomatic relief, which provides temporary relief. The patient also reports significant itching in the groin area and scalp. The patient denies any identifiable triggers for the itching, which appears to occur randomly. The patient's caregiver reports that the frequency of showers has been increased to every two days, which seems to provide some relief, but the itching persists.  The patient's current medication regimen includes Vitamin D, Restasis eye drops, Lexapro, Cozaar, Zofran as needed, Crestor, Miralax as needed, Vitamin B12, Janumet, and Acetaminophen twice daily for pain management. The patient also has a diagnosis of type 2 diabetes. The patient has previously tried Gabapentin for  neuropathy associated with diabetes but discontinued due to intolerance.  The patient also reports seasonal itching around the eyes, which is managed with Restasis and Pataday eye drops. The patient has noticed that the itching is more pronounced when in [place], attributing it to the local flora. The patient maintains good hydration and uses Dove sensitive skin body wash and Mencare shampoo for personal hygiene.      Chart Review:  Reviewed PCP notes from referral 12/21/22: chronic itching without a rash. Requesting referral to allergy..   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 Followed by Dr. Carmon Ginsberg at [facility].   Cataract 09/18/2020   Change in bowel habit 09/02/2021   Chronic idiopathic constipation 02/26/2021   Cognitive disorder 02/10/2021   Colon polyp 02/13/2012   Dec. 16, 2011 and next one due 5 years   Constipation 09/18/2020   Diabetes mellitus with coincident hypertension (HCC) 03/31/2017   Diabetes mellitus with neuropathy (HCC) 02/14/2013   Diabetic peripheral neuropathy associated with type 2 diabetes mellitus (HCC) 02/26/2021   Diarrhea 09/02/2021  Dizziness    Encounter for fitting and adjustment of hearing aid 09/18/2020   Essential hypertension  05/16/2018   Falls    Gait abnormality 03/15/2015   Followed by Dr. Lanell Matar with [facility]. He does have neuropathy. Dr. Jacky Kindle note 07/03/14 says etiology of apractic gait unclear. Ongoing neurology follow up.   Gout 02/26/2021   Hardening of the aorta (main artery of the heart) (HCC) 02/26/2021   Hearing loss 05/16/2018   History of fall 02/10/2021   History of malignant neoplasm of prostate 02/26/2021   Hyperlipidemia 05/16/2018   Impacted cerumen, bilateral 09/18/2020   Intraventricular hemorrhage (HCC) 05/06/2021   Lumbar degenerative disc disease 03/15/2015   Mild multilevel degenerative disc changes on MRI of the lumbar spine ordered by Dr. Lanell Matar, done 06/15/14   Male erectile dysfunction, unspecified 09/13/2016   Malignant neoplasm of prostate (HCC) 10/26/2010   Need for prophylactic vaccination and inoculation against influenza 09/18/2020   Neuropathy 06/20/2014   Obesity 09/18/2020   Onychomycosis 05/16/2018   Osteoarthritis 05/17/2013   Left shoulder   Other abnormalities of gait and mobility 02/10/2021   Pre-operative cardiovascular examination 05/16/2018   Pure hypercholesterolemia 02/26/2021   Rash and other nonspecific skin eruption 09/18/2020   Recurrent falls 02/26/2021   Ringing in ears 09/18/2020   Senile nuclear sclerosis 01/30/2013   Sensorineural hearing loss, bilateral 09/18/2020   Syncope and collapse 12/21/2020   Tinea cruris 05/16/2018   Tremor 02/10/2021   Trochanteric bursitis of right hip 05/28/2019   Type 2 diabetes mellitus without complications (HCC) 09/18/2020   Vertigo 05/16/2018   Vitamin D insufficiency 05/27/2013   Medication List:  Current Outpatient Medications  Medication Sig Dispense Refill   acetaminophen (TYLENOL) 500 MG tablet Take 1,000 mg by mouth in the morning and at bedtime.     Cholecalciferol (D3-1000 PO) Take 2,000 mg by mouth every evening.     cycloSPORINE (RESTASIS) 0.05 % ophthalmic emulsion Place 1 drop into  both eyes 2 (two) times daily.     escitalopram (LEXAPRO) 5 MG tablet Take 5 mg by mouth every morning.     Lancets (ONETOUCH DELICA PLUS LANCET33G) MISC Apply topically as directed.     losartan (COZAAR) 50 MG tablet Take 50 mg by mouth daily.     ONETOUCH ULTRA test strip 1 each by Other route as needed.     Polyethylene Glycol 3350 (MIRALAX PO) Take 1 Dose by mouth 4 (four) times a week.     rosuvastatin (CRESTOR) 10 MG tablet Take 10 mg by mouth every morning.     sitaGLIPtin-metformin (JANUMET) 50-1000 MG tablet Take 1 tablet by mouth 2 (two) times daily.     vitamin B-12 (CYANOCOBALAMIN) 1000 MCG tablet Take 1,000 mcg by mouth daily.     ondansetron (ZOFRAN-ODT) 8 MG disintegrating tablet Take 1 tablet (8 mg total) by mouth every 8 (eight) hours as needed for nausea or vomiting. (Patient not taking: Reported on 01/23/2023) 20 tablet 0   No current facility-administered medications for this visit.   Known Allergies:  Allergies  Allergen Reactions   Latex Shortness Of Breath    Skin itching and breathing problems Other reaction(s): rash   Ciprofloxacin Other (See Comments)    constipation   Seasonal Ic [Cholestatin] Other (See Comments)    Unknown reaction   Solifenacin Succinate Other (See Comments)    constipation   Tamsulosin Hcl     Weakness, vertigo, and nausea   Past Surgical History: Past Surgical History:  Procedure Laterality  Date   ADENOIDECTOMY     BLADDER SURGERY     bladder neck contracture repair   CATARACT EXTRACTION Left 2014   PROSTATE SURGERY  2009   TONSILLECTOMY     VASECTOMY  1978   WISDOM TOOTH EXTRACTION     Family History: Family History  Problem Relation Age of Onset   Heart disease Father    Hypertension Father    Heart disease Brother    Cancer Brother    Diabetes Neg Hx    Social History: Montoya lives in an apartment at [employer] with no water damage, wood floors, electric heating, no pets, no roaches, using dust mite protection  on the bedding and pillows, no smoke exposure.  He is retired.  He is exposed to chemicals and dust where he lives.  [place]   ROS:  All other systems negative except as noted per HPI.  Objective:  Blood pressure 130/82, pulse 68, temperature 97.9 F (36.6 C), temperature source Temporal, height 5' 10.6" (1.793 m), weight 201 lb 9.6 oz (91.4 kg), SpO2 96%. Body mass index is 28.44 kg/m. Physical Exam:  General Appearance:  Alert, cooperative, no distress, appears stated age  Head:  Normocephalic, without obvious abnormality, atraumatic  Eyes:  Conjunctiva clear, EOM's intact  Nose: Nares normal, no visible rhinorrhea  Throat: Lips, tongue normal; teeth and gums normal,   Neck: Supple, symmetrical  Lungs:   Respirations unlabored, no coughing  Heart:  Appears well perfused  Extremities: No edema  Skin: Well hydrated, no rash, cherry angiomas present, multiple waxy papules on back  Neurologic: No gross deficits   Diagnostics:  Labs:  Lab Orders         CBC with Differential/Platelet         CMP14+EGFR         TSH + free T4         Sed Rate (ESR)         Allergens, Zone 2       Assessment and Plan  Assessment and Plan    Generalized Pruritus No rash or skin lesions noted. Possible nerve-related itching due to age and diabetes. Sarna lotion provides temporary relief. No clear triggers identified. -Continue Sarna lotion as needed for itch control. -Try non-sedating antihistamines (Zyrtec, Claritin, Allegra, Xyzal) to see if it helps with itching. -Consider increasing Lexapro dose (to be discussed with prescribing provider). -Order labs to rule out liver disease, gallbladder disease, and thyroid disease. -Order environmental allergy panel to rule out allergies.  Type 2 Diabetes Possible contribution to nerve-related itching. -Continue current management.  Sensitive Skin History of sensitive skin, uses fragrance-free  soaps. -Continue current skincare regimen.  Eye Itching Possible environmental allergies, uses Restasis and Pataday. -Continue current eye drops. -Consider results of environmental allergy panel when available.     Follow up : pending results. It was a pleasure meeting you in clinic today! Thank you for allowing me to participate in your care.  Tonny Bollman, MD [facility]   This note in its entirety was forwarded to the Provider who requested this consultation.  Other: none  Thank you for your kind referral. I appreciate the opportunity to take part in [facility] care. Please do not hesitate to contact me with questions.  Sincerely,  Tonny Bollman, MD [facility]

## 2023-01-23 NOTE — Progress Notes (Signed)

## 2023-01-25 LAB — CMP14+EGFR
ALT: 15 [IU]/L (ref 0–44)
AST: 17 [IU]/L (ref 0–40)
Albumin: 4.6 g/dL (ref 3.7–4.7)
Alkaline Phosphatase: 48 [IU]/L (ref 44–121)
BUN/Creatinine Ratio: 19 (ref 10–24)
BUN: 20 mg/dL (ref 8–27)
Bilirubin Total: 0.5 mg/dL (ref 0.0–1.2)
CO2: 23 mmol/L (ref 20–29)
Calcium: 10.2 mg/dL (ref 8.6–10.2)
Chloride: 99 mmol/L (ref 96–106)
Creatinine, Ser: 1.08 mg/dL (ref 0.76–1.27)
Globulin, Total: 2.8 g/dL (ref 1.5–4.5)
Glucose: 106 mg/dL — ABNORMAL HIGH (ref 70–99)
Potassium: 4.8 mmol/L (ref 3.5–5.2)
Sodium: 135 mmol/L (ref 134–144)
Total Protein: 7.4 g/dL (ref 6.0–8.5)
eGFR: 67 mL/min/{1.73_m2} (ref >59)

## 2023-01-25 LAB — ALLERGENS, ZONE 2

## 2023-01-25 LAB — CBC WITH DIFFERENTIAL/PLATELET
Basophils Absolute: 0 10*3/uL (ref 0.0–0.2)
Basos: 1 %
EOS (ABSOLUTE): 0.2 10*3/uL (ref 0.0–0.4)
Eos: 3 %
Hematocrit: 46.2 % (ref 37.5–51.0)
Hemoglobin: 15.4 g/dL (ref 13.0–17.7)
Immature Grans (Abs): 0 10*3/uL (ref 0.0–0.1)
Immature Granulocytes: 0 %
Lymphocytes Absolute: 1.8 10*3/uL (ref 0.7–3.1)
Lymphs: 30 %
MCH: 32 pg (ref 26.6–33.0)
MCHC: 33.3 g/dL (ref 31.5–35.7)
MCV: 96 fL (ref 79–97)
Monocytes Absolute: 0.7 10*3/uL (ref 0.1–0.9)
Monocytes: 12 %
Neutrophils Absolute: 3.3 10*3/uL (ref 1.4–7.0)
Neutrophils: 54 %
Platelets: 282 10*3/uL (ref 150–450)
RBC: 4.82 x10E6/uL (ref 4.14–5.80)
RDW: 12.5 % (ref 11.6–15.4)
WBC: 6 10*3/uL (ref 3.4–10.8)

## 2023-01-25 LAB — TSH+FREE T4
Free T4: 1.36 ng/dL (ref 0.82–1.77)
TSH: 0.982 u[IU]/mL (ref 0.450–4.500)

## 2023-01-25 LAB — SEDIMENTATION RATE: Sed Rate: 16 mm/h (ref 0–30)

## 2023-01-25 NOTE — Progress Notes (Signed)
Please let Logan Hobbs know that his labs returned and look great. His environmental allergy panel was negative. I suspect neurogenic (nerve) itching as the reason for  your symptoms and would continue with sarna lotion as often as needed to help subside symptoms.

## 2023-07-20 ENCOUNTER — Encounter: Payer: Self-pay | Admitting: Cardiology

## 2023-07-20 ENCOUNTER — Ambulatory Visit: Payer: Medicare Other | Attending: Cardiology | Admitting: Cardiology

## 2023-07-20 VITALS — BP 128/74 | HR 74 | Ht 70.6 in | Wt 203.0 lb

## 2023-07-20 DIAGNOSIS — E119 Type 2 diabetes mellitus without complications: Secondary | ICD-10-CM | POA: Diagnosis present

## 2023-07-20 DIAGNOSIS — I6523 Occlusion and stenosis of bilateral carotid arteries: Secondary | ICD-10-CM | POA: Diagnosis present

## 2023-07-20 DIAGNOSIS — E7800 Pure hypercholesterolemia, unspecified: Secondary | ICD-10-CM | POA: Insufficient documentation

## 2023-07-20 DIAGNOSIS — I10 Essential (primary) hypertension: Secondary | ICD-10-CM | POA: Diagnosis present

## 2023-07-20 NOTE — Patient Instructions (Signed)

## 2023-07-20 NOTE — Progress Notes (Signed)
 Cardiology Office Note:    Date:  07/20/2023   ID:  Logan Hobbs, DOB [birth year], MRN [id number]  PCP:  Jarrett Soho, PA-C  Cardiologist:  Garwin Brothers, MD   Referring MD: Jarrett Soho, PA-C    ASSESSMENT:    1. Atherosclerosis of both carotid arteries   2. Diabetes mellitus with coincident hypertension (HCC)   3. Essential hypertension   4. Pure hypercholesterolemia    PLAN:    In order of problems listed above:  Atherosclerotic vascular disease: Stable from a clinical standpoint.  Asymptomatic.  Continue current medications.  Ambulate to the best of his ability. Essential hypertension: Blood pressure is stable and diet was emphasized Mixed dyslipidemia: Medications followed by primary goal is LDL less than 60 Diabetes mellitus: Not under the best of control.  Hemoglobin A1c was about 7.5 he is doing his best.  This is followed by primary care.  Diet emphasized Patient will be seen in follow-up appointment in 12 months or earlier if the patient has any concerns.    Medication Adjustments/Labs and Tests Ordered: Current medicines are reviewed at length with the patient today.  Concerns regarding medicines are outlined above.  No orders of the defined types were placed in this encounter.  No orders of the defined types were placed in this encounter.    No chief complaint on file.    History of Present Illness:    Logan Hobbs is a 89 y.o. male.  Patient has past medical history of atherosclerotic vascular disease, essential hypertension, mixed dyslipidemia and diabetes mellitus.  He denies any problems at this time and takes care of activities of daily living.  He ambulates with a walker and ambulates age appropriately.  At the time of my evaluation, the patient is alert awake oriented and in no distress.  No chest pain orthopnea or PND.  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 Followed by Dr. Carmon Ginsberg at [facility].   Cataract 09/18/2020   Change in bowel habit 09/02/2021   Chronic idiopathic constipation 02/26/2021   Chronic pruritus 01/23/2023   Cognitive disorder 02/10/2021   Colon polyp 02/13/2012   Dec. 16, 2011 and next one due 5 years   Constipation 09/18/2020   Diabetes mellitus with coincident hypertension (HCC) 03/31/2017   Diabetes mellitus with neuropathy (HCC) 02/14/2013   Diabetic peripheral neuropathy associated with type 2 diabetes mellitus (HCC) 02/26/2021   Diarrhea 09/02/2021   Dizziness    Encounter for fitting and adjustment of hearing aid 09/18/2020   Essential hypertension 05/16/2018   Falls    Gait abnormality 03/15/2015   Followed by Dr. Lanell Matar with [facility]. He does have neuropathy. Dr. Jacky Kindle note 07/03/14 says etiology of apractic gait unclear. Ongoing neurology follow up.   Gout 02/26/2021   Hardening of the aorta (main artery  of the heart) (HCC) 02/26/2021   Hearing loss 05/16/2018   History of fall 02/10/2021   History of malignant neoplasm of prostate 02/26/2021   Hyperlipidemia 05/16/2018   Impacted cerumen, bilateral 09/18/2020   Intraventricular hemorrhage (HCC) 05/06/2021   Lumbar degenerative disc disease 03/15/2015   Mild multilevel degenerative disc changes on MRI of the lumbar  spine ordered by Dr. Lanell Matar, done 06/15/14   Male erectile dysfunction, unspecified 09/13/2016   Malignant neoplasm of prostate (HCC) 10/26/2010   Need for prophylactic vaccination and inoculation against influenza 09/18/2020   Neuropathy 06/20/2014   Obesity 09/18/2020   Onychomycosis 05/16/2018   Osteoarthritis 05/17/2013   Left shoulder   Other abnormalities of gait and mobility 02/10/2021   Pre-operative cardiovascular examination 05/16/2018   Pure hypercholesterolemia 02/26/2021   Rash and other nonspecific skin eruption 09/18/2020   Recurrent falls 02/26/2021   Ringing in ears 09/18/2020   Senile nuclear sclerosis 01/30/2013   Sensorineural hearing loss, bilateral 09/18/2020   Syncope and collapse 12/21/2020   Tinea cruris 05/16/2018   Tremor 02/10/2021   Trochanteric bursitis of right hip 05/28/2019   Type 2 diabetes mellitus without complications (HCC) 09/18/2020   Vertigo 05/16/2018   Vitamin D insufficiency 05/27/2013    Past Surgical History:  Procedure Laterality Date   ADENOIDECTOMY     BLADDER SURGERY     bladder neck contracture repair   CATARACT EXTRACTION Left 2014   PROSTATE SURGERY  2009   TONSILLECTOMY     VASECTOMY  1978   WISDOM TOOTH EXTRACTION      Current Medications: Current Meds  Medication Sig   acetaminophen (TYLENOL) 500 MG tablet Take 1,000 mg by mouth in the morning and at bedtime.   Cholecalciferol (D3-1000 PO) Take 2,000 mg by mouth every evening.   cycloSPORINE (RESTASIS) 0.05 % ophthalmic emulsion Place 1 drop into both eyes 2 (two) times daily.   escitalopram (LEXAPRO) 5 MG tablet Take 5 mg by mouth every morning.   losartan (COZAAR) 50 MG tablet Take 50 mg by mouth daily.   Olopatadine HCl (PATADAY OP) Apply 1 drop to eye daily.   ondansetron (ZOFRAN-ODT) 8 MG disintegrating tablet Take 1 tablet (8 mg total) by mouth every 8 (eight) hours as needed for nausea or vomiting.   psyllium (METAMUCIL) 58.6 % powder Take 1 packet by mouth  daily.   rosuvastatin (CRESTOR) 10 MG tablet Take 10 mg by mouth every morning.   sitaGLIPtin-metformin (JANUMET) 50-1000 MG tablet Take 1 tablet by mouth 2 (two) times daily.   vitamin B-12 (CYANOCOBALAMIN) 1000 MCG tablet Take 1,000 mcg by mouth daily.     Allergies:   Latex, Ciprofloxacin, Seasonal ic [cholestatin], Solifenacin succinate, and Tamsulosin hcl   Social History   Socioeconomic History   Marital status: Widowed    Spouse name: Not on file   Number of children: 1   Years of education: Not on file   Highest education level: Master's degree (e.g., MA, MS, MEng, MEd, MSW, MBA)  Occupational History    Comment: retired Advertising account executive  Tobacco Use   Smoking status: Former    Types: Cigarettes    Passive exposure: Past   Smokeless tobacco: Never  Vaping Use   Vaping status: Never Used  Substance and Sexual Activity   Alcohol use: Not Currently    Alcohol/week: 7.0 standard drinks of alcohol    Types: 7 Cans of beer per week   Drug use: Never   Sexual activity: Not on file  Other  Topics Concern   Not on file  Social History Narrative   06/09/21 lives at [facility]    Caffeine    Social Drivers of Health   Financial Resource Strain: Not on file  Food Insecurity: Not on file  Transportation Needs: Not on file  Physical Activity: Not on file  Stress: Not on file  Social Connections: Not on file     Family History: The patient's family history includes Cancer in his brother; Heart disease in his brother and father; Hypertension in his father. There is no history of Diabetes.  ROS:   Please see the history of present illness.    All other systems reviewed and are negative.  EKGs/Labs/Other Studies Reviewed:    The following studies were reviewed today: I discussed my findings with the patient   Recent Labs: 01/23/2023: ALT 15; BUN 20; Creatinine, Ser 1.08; Hemoglobin 15.4; Platelets 282; Potassium 4.8; Sodium 135; TSH 0.982  Recent Lipid Panel No  results found for: "CHOL", "TRIG", "HDL", "CHOLHDL", "VLDL", "LDLCALC", "LDLDIRECT"  Physical Exam:    VS:  BP 128/74   Pulse 74   Ht 5' 10.6" (1.793 m)   Wt 203 lb (92.1 kg)   SpO2 96%   BMI 28.63 kg/m     Wt Readings from Last 3 Encounters:  07/20/23 203 lb (92.1 kg)  01/23/23 201 lb 9.6 oz (91.4 kg)  11/11/22 202 lb (91.6 kg)     GEN: Patient is in no acute distress HEENT: Normal NECK: No JVD; No carotid bruits LYMPHATICS: No lymphadenopathy CARDIAC: Hear sounds regular, 2/6 systolic murmur at the apex. RESPIRATORY:  Clear to auscultation without rales, wheezing or rhonchi  ABDOMEN: Soft, non-tender, non-distended MUSCULOSKELETAL:  No edema; No deformity  SKIN: Warm and dry NEUROLOGIC:  Alert and oriented x 3 PSYCHIATRIC:  Normal affect   Signed, Garwin Brothers, MD  07/20/2023 4:34 PM    [facility]

## 2023-11-16 ENCOUNTER — Encounter (HOSPITAL_COMMUNITY)

## 2023-11-16 ENCOUNTER — Ambulatory Visit

## 2023-11-24 ENCOUNTER — Other Ambulatory Visit: Payer: Self-pay

## 2023-11-24 DIAGNOSIS — I722 Aneurysm of renal artery: Secondary | ICD-10-CM

## 2023-12-21 ENCOUNTER — Ambulatory Visit (INDEPENDENT_AMBULATORY_CARE_PROVIDER_SITE_OTHER): Admitting: Physician Assistant

## 2023-12-21 ENCOUNTER — Ambulatory Visit (HOSPITAL_COMMUNITY)
Admission: RE | Admit: 2023-12-21 | Discharge: 2023-12-21 | Disposition: A | Discharge status: Home / Self Care | Source: Ambulatory Visit | Attending: Vascular Surgery | Admitting: Vascular Surgery

## 2023-12-21 VITALS — BP 139/83 | HR 57 | Temp 97.7°F | Wt 195.7 lb

## 2023-12-21 DIAGNOSIS — I722 Aneurysm of renal artery: Secondary | ICD-10-CM | POA: Diagnosis not present

## 2023-12-21 NOTE — Progress Notes (Signed)
 Office Note     CC:  follow up Requesting Provider:  Katina Pfeiffer, PA-C  HPI: Logan Hobbs is a 89 y.o. (12/29/1934) male who presents for survey and onset of right renal artery aneurysm.  This was discovered incidentally by CT in 2022 and estimated to be 2.1 cm.  He denies any new or changing abdominal or flank pain.  He also denies any changes to his kidney function, dysuria, or hematuria.  He is seen today in a wheelchair.  He is accompanied by his healthcare aide.  She states he has had some issues with falls this year.  He is on a daily statin.  He denies tobacco use.    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 Followed by Dr. Glo at [facility].   Cataract 09/18/2020   Change in bowel habit 09/02/2021   Chronic idiopathic constipation 02/26/2021   Chronic pruritus 01/23/2023   Cognitive disorder 02/10/2021   Colon polyp 02/13/2012   Dec. 16, 2011 and next one due 5 years   Constipation 09/18/2020   Diabetes mellitus with coincident hypertension (HCC) 03/31/2017   Diabetes mellitus with neuropathy (HCC) 02/14/2013   Diabetic peripheral neuropathy associated with type 2 diabetes mellitus (HCC) 02/26/2021   Diarrhea 09/02/2021   Dizziness    Encounter for fitting and adjustment  of hearing aid 09/18/2020   Essential hypertension 05/16/2018   Falls    Gait abnormality 03/15/2015   Followed by Dr. Joylene with [facility]. He does have neuropathy. Dr. Adeline note 07/03/14 says etiology of apractic gait unclear. Ongoing neurology follow up.   Gout 02/26/2021   Hardening of the aorta (main artery of the heart) (HCC) 02/26/2021   Hearing loss 05/16/2018   History of fall 02/10/2021   History of malignant neoplasm of prostate 02/26/2021   Hyperlipidemia 05/16/2018   Impacted cerumen, bilateral 09/18/2020   Intraventricular hemorrhage (HCC) 05/06/2021   Lumbar degenerative disc disease 03/15/2015   Mild multilevel degenerative disc changes on MRI of the lumbar spine ordered by Dr. Joylene, done 06/15/14   Male erectile dysfunction, unspecified 09/13/2016   Malignant neoplasm of prostate (HCC) 10/26/2010   Need for prophylactic vaccination and inoculation against influenza 09/18/2020   Neuropathy 06/20/2014   Obesity 09/18/2020   Onychomycosis 05/16/2018   Osteoarthritis 05/17/2013   Left shoulder   Other abnormalities of gait and mobility 02/10/2021   Pre-operative cardiovascular examination 05/16/2018   Pure hypercholesterolemia 02/26/2021   Rash and other nonspecific skin eruption 09/18/2020   Recurrent falls 02/26/2021   Ringing in ears 09/18/2020   Senile nuclear sclerosis 01/30/2013   Sensorineural hearing loss, bilateral 09/18/2020   Syncope and collapse 12/21/2020   Tinea cruris 05/16/2018   Tremor 02/10/2021   Trochanteric bursitis of right hip 05/28/2019   Type 2 diabetes mellitus without complications (HCC) 09/18/2020  Vertigo 05/16/2018   Vitamin D insufficiency 05/27/2013    Past Surgical History:  Procedure Laterality Date   ADENOIDECTOMY     BLADDER SURGERY     bladder neck contracture repair   CATARACT EXTRACTION Left 2014   PROSTATE SURGERY  2009   TONSILLECTOMY     VASECTOMY  1978   WISDOM TOOTH EXTRACTION      Social  History   Socioeconomic History   Marital status: Widowed    Spouse name: Not on file   Number of children: 1   Years of education: Not on file   Highest education level: Master's degree (e.g., MA, MS, MEng, MEd, MSW, MBA)  Occupational History    Comment: retired Advertising account executive  Tobacco Use   Smoking status: Former    Types: Cigarettes    Passive exposure: Past   Smokeless tobacco: Never  Vaping Use   Vaping status: Never Used  Substance and Sexual Activity   Alcohol use: Not Currently    Alcohol/week: 7.0 standard drinks of alcohol    Types: 7 Cans of beer per week   Drug use: Never   Sexual activity: Not on file  Other Topics Concern   Not on file  Social History Narrative   06/09/21 lives at [facility]    Caffeine    Social Drivers of Health   Financial Resource Strain: Not on file  Food Insecurity: Not on file  Transportation Needs: Not on file  Physical Activity: Not on file  Stress: Not on file  Social Connections: Not on file  Intimate Partner Violence: Not on file    Family History  Problem Relation Age of Onset   Heart disease Father    Hypertension Father    Heart disease Brother    Cancer Brother    Diabetes Neg Hx     Current Outpatient Medications  Medication Sig Dispense Refill   fluticasone (FLONASE) 50 MCG/ACT nasal spray Place into the nose.     acetaminophen  (TYLENOL ) 500 MG tablet Take 1,000 mg by mouth in the morning and at bedtime.     Cholecalciferol (D3-1000 PO) Take 2,000 mg by mouth every evening.     cycloSPORINE  (RESTASIS ) 0.05 % ophthalmic emulsion Place 1 drop into both eyes 2 (two) times daily.     escitalopram  (LEXAPRO ) 5 MG tablet Take 5 mg by mouth every morning.     losartan  (COZAAR ) 50 MG tablet Take 50 mg by mouth daily.     Olopatadine HCl (PATADAY OP) Apply 1 drop to eye daily.     ondansetron  (ZOFRAN -ODT) 8 MG disintegrating tablet Take 1 tablet (8 mg total) by mouth every 8 (eight) hours as needed for nausea or  vomiting. 20 tablet 0   psyllium (METAMUCIL) 58.6 % powder Take 1 packet by mouth daily.     rosuvastatin  (CRESTOR ) 10 MG tablet Take 10 mg by mouth every morning.     sitaGLIPtin -metformin  (JANUMET ) 50-1000 MG tablet Take 1 tablet by mouth 2 (two) times daily.     vitamin B-12 (CYANOCOBALAMIN) 1000 MCG tablet Take 1,000 mcg by mouth daily.     No current facility-administered medications for this visit.    Allergies  Allergen Reactions   Latex Shortness Of Breath    Skin itching and breathing problems Other reaction(s): rash   Ciprofloxacin Other (See Comments)    constipation   Seasonal Ic [Cholestatin] Other (See Comments)    Unknown reaction   Solifenacin Succinate Other (See Comments)    constipation  Tamsulosin Hcl     Weakness, vertigo, and nausea     REVIEW OF SYSTEMS:  Negative unless noted in HPI [X]  denotes positive finding, [ ]  denotes negative finding Cardiac  Comments:  Chest pain or chest pressure:    Shortness of breath upon exertion:    Short of breath when lying flat:    Irregular heart rhythm:        Vascular    Pain in calf, thigh, or hip brought on by ambulation:    Pain in feet at night that wakes you up from your sleep:     Blood clot in your veins:    Leg swelling:         Pulmonary    Oxygen  at home:    Productive cough:     Wheezing:         Neurologic    Sudden weakness in arms or legs:     Sudden numbness in arms or legs:     Sudden onset of difficulty speaking or slurred speech:    Temporary loss of vision in one eye:     Problems with dizziness:         Gastrointestinal    Blood in stool:     Vomited blood:         Genitourinary    Burning when urinating:     Blood in urine:        Psychiatric    Major depression:         Hematologic    Bleeding problems:    Problems with blood clotting too easily:        Skin    Rashes or ulcers:        Constitutional    Fever or chills:      PHYSICAL EXAMINATION:  Vitals:    12/21/23 0929  BP: 139/83  Pulse: (!) 57  Temp: 97.7 F (36.5 C)  TempSrc: Temporal  Weight: 195 lb 11.2 oz (88.8 kg)    General:  WDWN in NAD; vital signs documented above Gait: Not observed HENT: WNL, normocephalic Pulmonary: normal non-labored breathing Cardiac: regular HR Abdomen: soft, NT, no masses Skin: without rashes Extremities: without ischemic changes, without Gangrene , without cellulitis; without open wounds;  Musculoskeletal: no muscle wasting or atrophy  Neurologic: A&O X 3 Psychiatric:  The pt has Normal affect.   Non-Invasive Vascular Imaging:   1.8cm R renal artery aneurysm    ASSESSMENT/PLAN:: 89 y.o. male here for follow up for surveillance of right renal artery aneurysm  Right renal artery aneurysm remains asymptomatic.  Size is also stable by duplex.  Largest diameter today is 1.8 cm.  No indication for repair currently.  Per Dr. Silver, he would not be an open candidate based on age, comorbidities, and location of the aneurysm being adjacent to the kidney.  We will continue to observe with annual renal duplex.  He will contact the office if he experiences any sudden hematuria, dysuria, or change in renal function.   Donnice Sender, PA-C [facility] [phone]  Clinic MD:   [place] on call

## 2024-01-08 ENCOUNTER — Ambulatory Visit (INDEPENDENT_AMBULATORY_CARE_PROVIDER_SITE_OTHER): Admitting: Internal Medicine

## 2024-01-08 ENCOUNTER — Encounter: Payer: Self-pay | Admitting: Internal Medicine

## 2024-01-08 ENCOUNTER — Other Ambulatory Visit: Payer: Self-pay

## 2024-01-08 VITALS — Wt 195.6 lb

## 2024-01-08 DIAGNOSIS — H04123 Dry eye syndrome of bilateral lacrimal glands: Secondary | ICD-10-CM | POA: Insufficient documentation

## 2024-01-08 DIAGNOSIS — L299 Pruritus, unspecified: Secondary | ICD-10-CM | POA: Diagnosis not present

## 2024-01-08 DIAGNOSIS — R052 Subacute cough: Secondary | ICD-10-CM | POA: Insufficient documentation

## 2024-01-08 NOTE — Patient Instructions (Addendum)
 Dry eyes (keratoconjunctivitis sicca), bilateral Eyes are itchy and burning, likely due to dry eyes rather than allergies. Itching likely due to insufficient tear production. Allergy testing previously negative. - Continue Restasis  twice daily. - Use rewetting drops as needed. - Discuss with ophthalmologist about the use of Pataday and consider holding it if not beneficial.  Chronic cough, likely post-infectious Cough has been present for the last three weeks, described as a new symptom. No asthma or allergies. Likely post-infectious etiology, possibly related to bronchitis. Lungs clear with no signs of pneumonia. Not allergy-related.  Pruritus Itching has improved significantly with the use of anti-itch cream. Previous allergy testing negative.  Follow up : as needed It was a pleasure  in clinic today! Thank you for allowing me to participate in your care.  Rocky Endow, MD [facility] [place]

## 2024-01-08 NOTE — Progress Notes (Signed)
 FOLLOW UP Date of Service/Encounter:   01/08/2024  Subjective:  Logan Hobbs (DOB: 1935/02/07) is a 89 y.o. male who [facility] in re-evaluation of the following: chronic pruritus, new cough, itchy eyes History obtained from: chart review and patient and care provider.  For Review, LV was on 01/23/23  with Dr.Demetrios Byron seen for intial visit for chronic pruritius. See below for summary of history and diagnostics.   Therapeutic plans/changes recommended: environmental allergy serum panel negative; suspected neurogenic itch; CBCd and CMP wnl, thyroid  levels normal ----------------------------------------------------- Pertinent History/Diagnostics:  Generalized Pruritus No rash or skin lesions noted. Possible nerve-related itching due to age and diabetes. Sarna lotion provides temporary relief. No clear triggers identified. -labs 2024 showed normal CBCd, CMP, normal thyroid  studies and negative environmental allergy panel --------------------------------------------------- Today presents for follow-up. Discussed the use of AI scribe software for clinical note transcription with the patient, who gave verbal consent to proceed.  History of Present Illness Logan Hobbs is an 89 year old male who presents with persistent cough and eye irritation. He was referred by a doctor at the [facility] for evaluation of his persistent cough and eye irritation.  Chronic cough - Persistent cough for the past three weeks - Initially thought to be a wheeze, but this was not confirmed on further evaluation - Cough onset followed an upper respiratory issue a few months ago - No history of asthma - Allergy testing negative  Ocular irritation - Ongoing eye irritation characterized by itching and burning sensations - Uses Restasis  eye drops twice daily for dry eyes, but symptoms persist - Uses Pataday eye drops for itching, but questions his effectiveness - No  recent evaluation by eye doctor; plans to follow up in about a month  Environmental exposures - Recent change in aftershave use, unclear if related to current symptoms -feel significant improvement in symptoms with using anti-itch cream   All medications reviewed by clinical staff and updated in chart. No new pertinent medical or surgical history except as noted in HPI.  ROS: All others negative except as noted per HPI.   Objective:  Wt 195 lb 9.6 oz (88.7 kg)   BMI 27.59 kg/m  Body mass index is 27.59 kg/m. Physical Exam: General Appearance:  Alert, cooperative, no distress, appears stated age  Head:  Normocephalic, without obvious abnormality, atraumatic  Eyes:  Conjunctiva clear, EOM's intact  Nose: Nares normal, no rhinorrhea  Throat: Lips, tongue normal; teeth and gums normal, moist mucus membranes  Neck: Supple, symmetrical  Lungs:   clear to auscultation bilaterally, Respirations unlabored, no coughing  Heart:  regular rate and rhythm and no murmur, Appears well perfused  Extremities: No edema  Skin: Skin color, texture, turgor normal and no rashes or lesions on visualized portions of skin  Neurologic: No gross deficits   Labs:  Lab Orders  No laboratory test(s) ordered today    Assessment/Plan   Assessment and Plan Assessment & Plan Dry eyes (keratoconjunctivitis sicca), bilateral Eyes are itchy and burning, likely due to dry eyes rather than allergies. Itching likely due to insufficient tear production. Allergy testing previously negative. - Continue Restasis  twice daily. - Use rewetting drops as needed. - Discuss with ophthalmologist about the use of Pataday and consider holding it if not beneficial.  Chronic cough, likely post-infectious Cough has been present for the last three weeks, described as a new symptom. No asthma or allergies. Likely post-infectious etiology, possibly related to bronchitis. Lungs clear  with no signs of pneumonia. Not  allergy-related.  Pruritus Itching has improved significantly with the use of anti-itch cream. Previous allergy testing negative.  Follow up : as needed It was a pleasure  in clinic today! Thank you for allowing me to participate in your care.  Rocky Endow, MD [facility] [place]     Other: none  Rocky Endow, MD  [facility] [place] 

## 2024-02-26 ENCOUNTER — Encounter: Payer: Self-pay | Admitting: Diagnostic Neuroimaging

## 2024-02-26 ENCOUNTER — Other Ambulatory Visit: Payer: Self-pay

## 2024-02-26 ENCOUNTER — Ambulatory Visit: Admitting: Diagnostic Neuroimaging

## 2024-02-26 ENCOUNTER — Telehealth: Payer: Self-pay | Admitting: Diagnostic Neuroimaging

## 2024-02-26 VITALS — BP 118/72 | HR 78 | Ht 70.0 in | Wt 194.0 lb

## 2024-02-26 DIAGNOSIS — G20C Parkinsonism, unspecified: Secondary | ICD-10-CM

## 2024-02-26 DIAGNOSIS — G2589 Other specified extrapyramidal and movement disorders: Secondary | ICD-10-CM

## 2024-02-26 MED ORDER — CARBIDOPA-LEVODOPA 25-100 MG PO TABS: 1.0000 | ORAL_TABLET | Freq: Three times a day (TID) | ORAL | 6 refills | Status: DC

## 2024-02-26 MED ORDER — CARBIDOPA-LEVODOPA 25-100 MG PO TABS: 0.5000 | ORAL_TABLET | Freq: Three times a day (TID) | ORAL | 0 refills | Status: DC

## 2024-02-26 NOTE — Progress Notes (Signed)
 Script printed for signature and faxed.  Also called [facility] and had script cancelled that was accidentally sent

## 2024-02-26 NOTE — Patient Instructions (Signed)
  GAIT DIFFICULTY (since 2013; due to long standing diabetic neuropathy; also with back pain and mild spastic gait; some subtle, mild parkinsonian symptoms: akinetic, rigid with mild memory issues)  - check DATscan  - start carbidopa / levodopa (25/100) half tab three times a day (~30-60 minutes before meals) x 1-2 weeks; then 1 tab three times a day (~30-60 minutes before meals)

## 2024-02-26 NOTE — Telephone Encounter (Signed)
 Pt wife called to request medication refill . Pt wife states  she will like ot discuss Pt mediation Because pharmacy is stating they will need a new order   carbidopa-levodopa (SINEMET IR) 25-100 MG tablet

## 2024-02-26 NOTE — Telephone Encounter (Signed)
 Call to wife, she reports [facility] needs specific prescription for titration dose faxed to [phone].Script printed fro Dr. Margaret to sign

## 2024-02-26 NOTE — Telephone Encounter (Signed)
 [facility]) need clarification for carbidopa-levodopa (SINEMET IR) 25-100 MG tablet due to there are 2 orders; half tablet 3 times aday for 1 to 2 weeks. Need order to be specific is it 1 wk or 2wks. Could you sign the order for the half tablet. Please call to discuss. Can fax to [phone]

## 2024-02-26 NOTE — Progress Notes (Signed)
 [facility]  PATIENT: Logan Hobbs DOB: 06/10/1934  REFERRING CLINICIAN: Vinie Allean BIRCH, MD HISTORY FROM: patient and caregiver Janese F) REASON FOR VISIT: follow up   HISTORICAL  CHIEF COMPLAINT:  Chief Complaint  Patient presents with   Fall    Rm 7 with care giver Pt is well, reports recurrent falls, stiffness in BLE and spastic gait. He has difficulty with coordination and keeping his hands steady. Pt is concerned for parkinsons. No known family history of parkinsons he is aware of.    HISTORY OF PRESENT ILLNESS:   UPDATE (02/26/24, VRP): Since last visit, continues with gait and balance issues. Now at assisted living [facility]). Some mild memory issues continue. Using walker. Gait and balance is worsening.  UPDATE (12/06/21, VRP): Since last visit, doing about the same. Still with low back pain. Leg pain, gait diff. Results reviewed. No tremor. Mild memory issues.   PRIOR HPI (05/12/21, VRP): 89 year old male here for evaluation of gait and balance difficulty.  Patient has had gait balance difficulty since 2013, previous evaluated by outside neurologist.  He was diagnosed with diabetic neuropathy.  He has used cane, walker for several years.  Unfortunately his gait has continued to decline.  Now patient feeling stiffness in his legs.  He has had several major falls especially in August 2022 when he tripped, fell forward and landed on his chin on the concrete.  He went to the hospital for evaluation.  He had some headaches after this.  He returned to the hospital more recently a week ago due to increasing headaches.  He was found to have intraventricular hemorrhage, thought to be related to hypertensive cause.   REVIEW OF SYSTEMS: Full 14 system review of systems performed and negative with exception of: as per HPI.  ALLERGIES: Allergies  Allergen Reactions   Latex Shortness Of Breath    Skin itching and breathing problems Other  reaction(s): rash   Ciprofloxacin Other (See Comments)    constipation   Seasonal Ic [Cholestatin] Other (See Comments)    Unknown reaction   Solifenacin Succinate Other (See Comments)    constipation   Tamsulosin Hcl     Weakness, vertigo, and nausea    HOME MEDICATIONS: Outpatient Medications Prior to Visit  Medication Sig Dispense Refill   acetaminophen  (TYLENOL ) 500 MG tablet Take 1,000 mg by mouth in the morning and at bedtime.     camphor-menthol (SARNA) lotion Apply topically.     chlorhexidine  (PERIDEX ) 0.12 % solution      Cholecalciferol (D3-1000 PO) Take 2,000 mg by mouth every evening.     cycloSPORINE  (RESTASIS ) 0.05 % ophthalmic emulsion Place 1 drop into both eyes 2 (two) times daily.     diclofenac Sodium (VOLTAREN) 1 % GEL Apply topically.     escitalopram  (LEXAPRO ) 5 MG tablet Take 5 mg by mouth every morning.     fluticasone (FLONASE) 50 MCG/ACT nasal spray Place into the nose.     losartan  (COZAAR ) 50 MG tablet Take 50 mg by mouth daily.     psyllium (METAMUCIL) 58.6 % powder Take 1 packet by mouth daily.     rosuvastatin  (CRESTOR ) 10 MG tablet Take 10 mg by mouth every morning.     sitaGLIPtin -metformin  (JANUMET ) 50-1000 MG tablet Take 1 tablet by mouth 2 (two) times daily.     vitamin B-12 (CYANOCOBALAMIN) 1000 MCG tablet Take 1,000 mcg by mouth daily.     fexofenadine (ALLEGRA) 180 MG tablet fexofenadine 180  mg     Olopatadine HCl (PATADAY OP) Apply 1 drop to eye daily.     ondansetron  (ZOFRAN -ODT) 8 MG disintegrating tablet Take 1 tablet (8 mg total) by mouth every 8 (eight) hours as needed for nausea or vomiting. 20 tablet 0   sitaGLIPtin -metformin  (JANUMET ) 50-1000 MG tablet Janumet  50-1,000 mg     No facility-administered medications prior to visit.    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 Followed by Dr. Glo at [facility].   Cataract 09/18/2020   Change in bowel habit 09/02/2021   Chronic idiopathic constipation 02/26/2021   Chronic pruritus 01/23/2023   Cognitive disorder 02/10/2021   Colon polyp 02/13/2012   Dec. 16, 2011 and next one due 5 years   Constipation 09/18/2020   Diabetes mellitus with coincident hypertension (HCC) 03/31/2017   Diabetes mellitus with neuropathy (HCC) 02/14/2013   Diabetic peripheral neuropathy associated with type 2 diabetes mellitus (HCC) 02/26/2021   Diarrhea 09/02/2021   Dizziness    Encounter for fitting and adjustment of hearing aid 09/18/2020   Essential hypertension 05/16/2018   Falls    Gait abnormality 03/15/2015   Followed by Dr. Joylene with [facility]. He does have neuropathy. Dr. Adeline note 07/03/14 says etiology of apractic gait unclear. Ongoing neurology follow up.   Gout 02/26/2021   Hardening of the aorta (main artery of the heart) 02/26/2021   Hearing loss 05/16/2018   History of fall 02/10/2021   History of malignant neoplasm of prostate 02/26/2021   Hyperlipidemia 05/16/2018   Impacted cerumen, bilateral 09/18/2020   Intraventricular hemorrhage (HCC) 05/06/2021   Lumbar degenerative disc disease 03/15/2015   Mild multilevel degenerative disc changes on MRI of the lumbar spine ordered by Dr. Joylene, done 06/15/14   Male erectile dysfunction, unspecified  09/13/2016   Malignant neoplasm of prostate (HCC) 10/26/2010   Need for prophylactic vaccination and inoculation against influenza 09/18/2020   Neuropathy 06/20/2014   Obesity 09/18/2020   Onychomycosis 05/16/2018   Osteoarthritis 05/17/2013   Left shoulder   Other abnormalities of gait and mobility 02/10/2021   Pre-operative cardiovascular examination 05/16/2018   Pure hypercholesterolemia 02/26/2021   Rash and other nonspecific skin eruption 09/18/2020   Recurrent falls 02/26/2021   Ringing in ears 09/18/2020   Senile nuclear sclerosis 01/30/2013   Sensorineural hearing loss, bilateral 09/18/2020   Syncope and collapse 12/21/2020   Tinea cruris 05/16/2018   Tremor 02/10/2021   Trochanteric bursitis of right hip 05/28/2019   Type 2 diabetes mellitus without complications (HCC) 09/18/2020   Vertigo 05/16/2018   Vitamin D insufficiency 05/27/2013    PAST SURGICAL HISTORY: Past Surgical History:  Procedure Laterality Date   ADENOIDECTOMY     BLADDER SURGERY     bladder neck contracture repair   CATARACT EXTRACTION Left 2014   PROSTATE SURGERY  2009   TONSILLECTOMY     VASECTOMY  1978   WISDOM TOOTH EXTRACTION      FAMILY HISTORY: Family History  Problem Relation Age of Onset   Heart disease Father    Hypertension Father    Heart disease Brother    Cancer Brother    Diabetes Neg Hx     SOCIAL HISTORY: Social History   Socioeconomic History   Marital status: Widowed    Spouse name: Not on file   Number of children: 1   Years of education: Not on file   Highest education level: Master's degree (e.g., MA, MS, MEng, MEd, MSW, MBA)  Occupational History    Comment: retired advertising account executive  Tobacco Use   Smoking status: Former    Types: Cigarettes    Passive exposure: Past   Smokeless tobacco: Never  Vaping Use   Vaping status: Never Used  Substance and Sexual Activity   Alcohol use: Not Currently    Alcohol/week: 7.0 standard drinks of alcohol    Types:  7 Cans of beer per week   Drug use: Never   Sexual activity: Not on file  Other Topics Concern   Not on file  Social History Narrative   06/09/21 lives at [facility]    Caffeine    Social Drivers of Health   Financial Resource Strain: Not on file  Food Insecurity: Not on file  Transportation Needs: Not on file  Physical Activity: Not on file  Stress: Not on file  Social Connections: Not on file  Intimate Partner Violence: Not on file     PHYSICAL EXAM  GENERAL EXAM/CONSTITUTIONAL: Vitals:  Vitals:   02/26/24 0957  BP: 118/72  Pulse: 78  Weight: 194 lb (88 kg)  Height: 5' 10 (1.778 m)   Body mass index is 27.84 kg/m. Wt Readings from Last 3 Encounters:  02/26/24 194 lb (88 kg)  01/08/24 195 lb 9.6 oz (88.7 kg)  12/21/23 195 lb 11.2 oz (88.8 kg)   Patient is in no distress; well developed, nourished and groomed; neck is supple  CARDIOVASCULAR: Examination of carotid arteries is normal; no carotid bruits Regular rate and rhythm, no murmurs Examination of peripheral vascular system by observation and palpation is normal  EYES: Ophthalmoscopic exam of optic discs and posterior segments is normal; no papilledema or hemorrhages No results found.  MUSCULOSKELETAL: Gait, strength, tone, movements noted in Neurologic exam below  NEUROLOGIC: MENTAL STATUS:      No data to display         awake, alert, oriented to person, place and time recent and remote memory intact normal attention and concentration language fluent, comprehension intact, naming intact fund of knowledge appropriate  CRANIAL NERVE:  2nd - no papilledema on fundoscopic exam 2nd, 3rd, 4th, 6th - pupils equal and reactive to light, visual fields full to confrontation, extraocular muscles intact, no nystagmus 5th - facial sensation symmetric 7th - facial strength symmetric 8th - hearing intact 9th - palate elevates symmetrically, uvula midline 11th - shoulder shrug symmetric 12th -  tongue protrusion midline HOARSE VOICE  MOTOR:  normal bulk; NO RIGIDITY; MILD BRADYKINESIA IN LUE AND BLE BUE 5; BLE 5  SENSORY:  normal and symmetric to light touch, temperature, vibration; except decr in feet  COORDINATION:  finger-nose-finger, fine finger movements SLOW  REFLEXES:  deep tendon reflexes 1+ and symmetric  GAIT/STATION:  SHORT STEPS; VERY UNSTEADY; USING WALKER     DIAGNOSTIC DATA (LABS, IMAGING, TESTING) - I reviewed patient  records, labs, notes, testing and imaging myself where available.  Lab Results  Component Value Date   WBC 6.0 01/23/2023   HGB 15.4 01/23/2023   HCT 46.2 01/23/2023   MCV 96 01/23/2023   PLT 282 01/23/2023      Component Value Date/Time   NA 135 01/23/2023 1037   K 4.8 01/23/2023 1037   CL 99 01/23/2023 1037   CO2 23 01/23/2023 1037   GLUCOSE 106 (H) 01/23/2023 1037   GLUCOSE 169 (H) 05/06/2021 1456   BUN 20 01/23/2023 1037   CREATININE 1.08 01/23/2023 1037   CALCIUM  10.2 01/23/2023 1037   PROT 7.4 01/23/2023 1037   ALBUMIN 4.6 01/23/2023 1037   AST 17 01/23/2023 1037   ALT 15 01/23/2023 1037   ALKPHOS 48 01/23/2023 1037   BILITOT 0.5 01/23/2023 1037   GFRNONAA >60 05/06/2021 1456   No results found for: CHOL, HDL, LDLCALC, LDLDIRECT, TRIG, CHOLHDL Lab Results  Component Value Date   HGBA1C 7.8 (H) 12/22/2020   Lab Results  Component Value Date   VITAMINB12 1,065 05/12/2021   Lab Results  Component Value Date   TSH 0.982 01/23/2023    01/28/14 EMG/NCS This is an abnormal EMG and nerve conduction study of the upper and lower extremities. There is electrophysiologic evidence for minimal mixed sensorimotor polyneuropathy. There is electrophysiologic evidence for minimal left median neuropathy at the wrist and mild non-localizable left ulnar neuropathy. There is no electrophysiologic evidence for a radiculopathy, plexopathy, or myopathy.  12/21/20 CT head / cervical spine  1. No acute intracranial  pathology. Small-vessel white matter disease. 2. No displaced fracture or dislocation of the facial bones. 3. Soft tissue contusion of the chin. 4. No fracture or static subluxation of the cervical spine. 5. Focally moderate disc space height loss and osteophytosis of C5-C6, with otherwise mild disc degenerative disease.  05/07/21 CT head  1. Unchanged intraventricular hemorrhage. 2. Unchanged ventriculomegaly associated with brain atrophy.  05/06/21 CTA head / neck 1. Negative CTA for large vessel occlusion. No aneurysm or other vascular malformation. 2. 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 3. No other hemodynamically significant or correctable stenosis about the major arterial vasculature of the head and neck.  05/28/21 MRI brain 1. No acute intracranial abnormality or mass. 2. Interval resolution of intraventricular hemorrhage. 3. Mild chronic small vessel ischemic disease and moderately advanced cerebral atrophy.   05/26/21  MRI cervical spine (with and without) demonstrating: - At C3-4 disc bulging and uncovertebral joint hypertrophy with severe biforaminal stenosis. - At C4-5 disc bulging and uncovertebral joint hypertrophy with severe right foraminal stenosis. - At C5-6 disc bulging and uncovertebral joint hypertrophy with severe biforaminal stenosis.  05/26/21  Unremarkable MRI thoracic spine (with and without).  05/28/21 MRI brain  1. No acute intracranial abnormality or mass. 2. Interval resolution of intraventricular hemorrhage. 3. Mild chronic small vessel ischemic disease and moderately advanced cerebral atrophy.  12/17/21 MRI lumbar spine (without) demonstrating - At L3-4: disc bulging and facet hypertrophy with mild spinal stenosis and moderate biforaminal stenosis. - At L2-3: left uncovertebral joint hypertrophy with mild left foraminal stenosis.  05/12/21 labs - paraneoplastic, B12, GAD antibody, copper, ceruloplasmin, zinc --> all  unremarkable    ASSESSMENT AND PLAN  89 y.o. year old male here with longstanding gait and balance difficulty since 2013.  Dx:  1. Akinetic rigid syndrome   2. Parkinsonism, unspecified Parkinsonism type (HCC)     PLAN:  GAIT DIFFICULTY (since 2013; due to long standing  diabetic neuropathy; also with back pain and mild spastic gait; some subtle, mild parkinsonian symptoms: akinetic, rigid with mild memory issues)  - check DATscan  - start carbidopa / levodopa (25/100) half tab three times a day (~30-60 minutes before meals) x 1-2 weeks; then 1 tab three times a day (~30-60 minutes before meals)   INTRAVENTRICULAR HEMORRHAGE (spontaneous vs post-traumatic) - stable; possible due to HTN vs traumatic  Orders Placed This Encounter  Procedures   NM BRAIN DATSCAN TUMOR LOC INFLAM SPECT 1 DAY   Return in about 6 months (around 08/25/2024) for MyChart visit (15 min).  I spent 40 minutes of face-to-face and non-face-to-face time with patient.  This included previsit chart review, lab review, study review, order entry, electronic health record documentation, patient education.     EDUARD FABIENE HANLON, MD 02/26/2024, 10:59 AM Certified in Neurology, Neurophysiology and Neuroimaging  [facility] [address] [place], [place] [address] [phone]

## 2024-02-28 NOTE — Telephone Encounter (Signed)
  [facility]  called to request order be sent over for  Pt to be  carbidopa-levodopa (SINEMET IR) 25-100 MG tablet     They said they just need order stating that Pt is suppose to take 1/2 of tablet   Fax: [phone]

## 2024-02-28 NOTE — Addendum Note (Signed)
 Addended by: ONEITA HOIST E on: 02/28/2024 11:30 AM   Modules accepted: Orders

## 2024-02-28 NOTE — Telephone Encounter (Signed)
 Script pending signature.

## 2024-02-29 NOTE — Telephone Encounter (Signed)
 I called [facility]. I spoke to care giver who then spoke to med tech. She stated that would check with pharmacy, if they needed anything then would let us  know.  Per April RN she faxed on 02-26-2024.  So will close for now.

## 2024-03-04 ENCOUNTER — Other Ambulatory Visit: Payer: Self-pay | Admitting: *Deleted

## 2024-03-04 MED ORDER — CARBIDOPA-LEVODOPA 25-100 MG PO TABS: 0.5000 | ORAL_TABLET | Freq: Three times a day (TID) | ORAL | 0 refills | Status: DC

## 2024-03-04 NOTE — Telephone Encounter (Signed)
 Pharmacy is needing the new Rx order sent to them so that the pt can get the 0.5 pill for 2 weeks. They are needing the order faxed to [phone]. Please advise.

## 2024-03-04 NOTE — Telephone Encounter (Signed)
 Printed to Dr. Margaret so sign off.

## 2024-03-05 NOTE — Telephone Encounter (Addendum)
 Script signed and faxed to pharmacy this evening

## 2024-03-15 ENCOUNTER — Encounter (HOSPITAL_COMMUNITY)
Admission: RE | Admit: 2024-03-15 | Discharge: 2024-03-15 | Disposition: A | Discharge status: Home / Self Care | Source: Ambulatory Visit | Attending: Diagnostic Neuroimaging | Admitting: Diagnostic Neuroimaging

## 2024-03-15 ENCOUNTER — Encounter (HOSPITAL_COMMUNITY): Payer: Self-pay

## 2024-03-15 DIAGNOSIS — G20C Parkinsonism, unspecified: Secondary | ICD-10-CM | POA: Diagnosis present

## 2024-03-15 DIAGNOSIS — G2589 Other specified extrapyramidal and movement disorders: Secondary | ICD-10-CM | POA: Insufficient documentation

## 2024-03-15 MED ORDER — IOFLUPANE I 123 185 MBQ/2.5ML IV SOLN
5.0000 | Freq: Once | INTRAVENOUS | Status: DC
  Filled 2024-03-15: qty 5

## 2024-03-25 ENCOUNTER — Ambulatory Visit: Payer: Self-pay | Admitting: Diagnostic Neuroimaging

## 2024-03-25 ENCOUNTER — Telehealth: Payer: Self-pay | Admitting: Diagnostic Neuroimaging

## 2024-03-25 NOTE — Telephone Encounter (Signed)
 Pt's care giver is asking for a call with results to DAT scan done on 03/15/24

## 2024-04-01 NOTE — Progress Notes (Signed)
 I called the patient to go over lab results. I left a message for the patient to call the office back.   Per Dr. Penumallii - Normal DATScan  result. This makes parkinsonism less likely. May continue carb/levo if it is helping symptoms, otherwise would recommend to stop. -VRP

## 2024-04-01 NOTE — Telephone Encounter (Signed)
 Pt wife called to request  to speak  to someone about Pt  results .Pt wife would like a call.

## 2024-04-02 NOTE — Telephone Encounter (Signed)
 Normal DATScan  result. This makes parkinsonism less likely. May continue carb/levo if it is helping symptoms, otherwise would recommend to stop. -VRP

## 2024-04-04 ENCOUNTER — Encounter: Payer: Self-pay | Admitting: *Deleted

## 2024-04-04 NOTE — Telephone Encounter (Signed)
 Spoke with Dr Margaret. He recommends to cancel May appt for now and then if patient notices worsening of tremor to please call back and schedule a follow-up. I went ahead and canceled this appointment since the pt and caregiver previously told me they were ok to keep or cancel appt.   I called the pt's caregiver Dorothe Large back and LVM asking for call back. Phone staff, when she calls back, please give her the message above regarding appt. Also let them know I have faxed the sinemet  cancellation order to [facility].   Sinemet  d/c order faxed to [facility]. Received a receipt of confirmation.

## 2024-04-04 NOTE — Telephone Encounter (Signed)
 Pt's caretaker is asking for a call back with results from DAT scan

## 2024-04-04 NOTE — Telephone Encounter (Signed)
 I discussed the DaTscan  results with the caregiver Dorothe (on [facility]) and patient. They verbalized understanding of results. Patient does not feel the Sinemet  provides him any benefit but only makes him sleepy. He is amenable to stopping the medication. Caregiver states [place] [facility] will need to have an order from provider to stop administering the Sinemet . Dorothe also asked if patient needs to follow-up again as scheduled in May. They are ok either way.   Called [place] and spoke with nurse Mwati. He said to fax a signed order from Dr Margaret. Can be letter on letterhead. Fax # is [phone]

## 2024-04-05 NOTE — Progress Notes (Signed)
 Patient's caregiver has bee notified and has no further questions or concerns

## 2024-08-27 ENCOUNTER — Ambulatory Visit: Admitting: Diagnostic Neuroimaging
